# Patient Record
Sex: Male | Born: 1955
Health system: Southern US, Community
[De-identification: ages and names within clinical notes are randomized; demographics above are authoritative.]

## PROBLEM LIST (undated history)

## (undated) DIAGNOSIS — I2510 Atherosclerotic heart disease of native coronary artery without angina pectoris: Secondary | ICD-10-CM

## (undated) DIAGNOSIS — J449 Chronic obstructive pulmonary disease, unspecified: Secondary | ICD-10-CM

## (undated) DIAGNOSIS — F419 Anxiety disorder, unspecified: Secondary | ICD-10-CM

## (undated) DIAGNOSIS — N189 Chronic kidney disease, unspecified: Secondary | ICD-10-CM

## (undated) DIAGNOSIS — F32A Depression, unspecified: Secondary | ICD-10-CM

## (undated) DIAGNOSIS — K635 Polyp of colon: Secondary | ICD-10-CM

## (undated) DIAGNOSIS — E785 Hyperlipidemia, unspecified: Secondary | ICD-10-CM

## (undated) DIAGNOSIS — Z720 Tobacco use: Secondary | ICD-10-CM

## (undated) DIAGNOSIS — F329 Major depressive disorder, single episode, unspecified: Secondary | ICD-10-CM

## (undated) DIAGNOSIS — E860 Dehydration: Secondary | ICD-10-CM

## (undated) DIAGNOSIS — E119 Type 2 diabetes mellitus without complications: Secondary | ICD-10-CM

## (undated) DIAGNOSIS — H269 Unspecified cataract: Secondary | ICD-10-CM

## (undated) DIAGNOSIS — I10 Essential (primary) hypertension: Secondary | ICD-10-CM

## (undated) DIAGNOSIS — K5790 Diverticulosis of intestine, part unspecified, without perforation or abscess without bleeding: Secondary | ICD-10-CM

## (undated) DIAGNOSIS — I219 Acute myocardial infarction, unspecified: Secondary | ICD-10-CM

## (undated) HISTORY — PX: EYE SURGERY: SHX253

## (undated) HISTORY — DX: Anxiety disorder, unspecified: F41.9

## (undated) HISTORY — DX: Essential (primary) hypertension: I10

## (undated) HISTORY — PX: OTHER SURGICAL HISTORY: SHX169

## (undated) HISTORY — DX: Depression, unspecified: F32.A

## (undated) HISTORY — PX: CARDIAC CATHETERIZATION: SHX172

## (undated) HISTORY — DX: Polyp of colon: K63.5

## (undated) HISTORY — PX: COLONOSCOPY: SHX174

## (undated) HISTORY — DX: Diverticulosis of intestine, part unspecified, without perforation or abscess without bleeding: K57.90

## (undated) HISTORY — PX: POLYPECTOMY: SHX149

## (undated) HISTORY — PX: WISDOM TOOTH EXTRACTION: SHX21

## (undated) HISTORY — DX: Chronic obstructive pulmonary disease, unspecified: J44.9

## (undated) HISTORY — PX: BACK SURGERY: SHX140

## (undated) HISTORY — DX: Hyperlipidemia, unspecified: E78.5

## (undated) HISTORY — DX: Unspecified cataract: H26.9

## (undated) HISTORY — DX: Major depressive disorder, single episode, unspecified: F32.9

---

## 2005-08-17 ENCOUNTER — Ambulatory Visit: Payer: Self-pay | Admitting: Internal Medicine

## 2005-11-29 ENCOUNTER — Ambulatory Visit: Payer: Self-pay | Admitting: Internal Medicine

## 2005-12-23 ENCOUNTER — Ambulatory Visit: Payer: Self-pay | Admitting: Gastroenterology

## 2006-01-28 ENCOUNTER — Ambulatory Visit: Payer: Self-pay | Admitting: Gastroenterology

## 2006-01-28 ENCOUNTER — Encounter (INDEPENDENT_AMBULATORY_CARE_PROVIDER_SITE_OTHER): Payer: Self-pay | Admitting: Specialist

## 2007-02-06 ENCOUNTER — Ambulatory Visit: Payer: Self-pay | Admitting: Internal Medicine

## 2007-02-06 DIAGNOSIS — F17210 Nicotine dependence, cigarettes, uncomplicated: Secondary | ICD-10-CM | POA: Insufficient documentation

## 2007-02-06 DIAGNOSIS — L299 Pruritus, unspecified: Secondary | ICD-10-CM | POA: Insufficient documentation

## 2007-02-06 DIAGNOSIS — R7989 Other specified abnormal findings of blood chemistry: Secondary | ICD-10-CM | POA: Insufficient documentation

## 2007-02-06 DIAGNOSIS — I10 Essential (primary) hypertension: Secondary | ICD-10-CM | POA: Insufficient documentation

## 2007-02-06 DIAGNOSIS — Z720 Tobacco use: Secondary | ICD-10-CM

## 2007-02-13 ENCOUNTER — Encounter (INDEPENDENT_AMBULATORY_CARE_PROVIDER_SITE_OTHER): Payer: Self-pay | Admitting: *Deleted

## 2007-03-31 ENCOUNTER — Ambulatory Visit: Payer: Self-pay | Admitting: Internal Medicine

## 2007-08-02 ENCOUNTER — Ambulatory Visit: Payer: Self-pay | Admitting: Internal Medicine

## 2007-08-02 DIAGNOSIS — E785 Hyperlipidemia, unspecified: Secondary | ICD-10-CM | POA: Insufficient documentation

## 2007-08-07 ENCOUNTER — Encounter (INDEPENDENT_AMBULATORY_CARE_PROVIDER_SITE_OTHER): Payer: Self-pay | Admitting: *Deleted

## 2007-08-07 ENCOUNTER — Telehealth (INDEPENDENT_AMBULATORY_CARE_PROVIDER_SITE_OTHER): Payer: Self-pay | Admitting: *Deleted

## 2007-08-07 LAB — CONVERTED CEMR LAB: Hgb A1c MFr Bld: 6.2 % — ABNORMAL HIGH (ref 4.6–6.1)

## 2007-08-08 ENCOUNTER — Encounter (INDEPENDENT_AMBULATORY_CARE_PROVIDER_SITE_OTHER): Payer: Self-pay | Admitting: *Deleted

## 2007-08-08 ENCOUNTER — Telehealth (INDEPENDENT_AMBULATORY_CARE_PROVIDER_SITE_OTHER): Payer: Self-pay | Admitting: *Deleted

## 2007-08-08 ENCOUNTER — Ambulatory Visit: Payer: Self-pay | Admitting: Internal Medicine

## 2007-08-08 DIAGNOSIS — T887XXA Unspecified adverse effect of drug or medicament, initial encounter: Secondary | ICD-10-CM | POA: Insufficient documentation

## 2008-09-18 ENCOUNTER — Telehealth (INDEPENDENT_AMBULATORY_CARE_PROVIDER_SITE_OTHER): Payer: Self-pay | Admitting: *Deleted

## 2008-10-03 ENCOUNTER — Ambulatory Visit: Payer: Self-pay | Admitting: Internal Medicine

## 2008-10-03 DIAGNOSIS — Z860100 Personal history of colon polyps, unspecified: Secondary | ICD-10-CM | POA: Insufficient documentation

## 2008-10-03 DIAGNOSIS — K5730 Diverticulosis of large intestine without perforation or abscess without bleeding: Secondary | ICD-10-CM | POA: Insufficient documentation

## 2009-12-01 ENCOUNTER — Telehealth (INDEPENDENT_AMBULATORY_CARE_PROVIDER_SITE_OTHER): Payer: Self-pay | Admitting: *Deleted

## 2009-12-22 ENCOUNTER — Ambulatory Visit: Payer: Self-pay | Admitting: Internal Medicine

## 2009-12-22 ENCOUNTER — Encounter (INDEPENDENT_AMBULATORY_CARE_PROVIDER_SITE_OTHER): Payer: Self-pay | Admitting: *Deleted

## 2009-12-22 DIAGNOSIS — N400 Enlarged prostate without lower urinary tract symptoms: Secondary | ICD-10-CM | POA: Insufficient documentation

## 2009-12-22 DIAGNOSIS — G5600 Carpal tunnel syndrome, unspecified upper limb: Secondary | ICD-10-CM | POA: Insufficient documentation

## 2009-12-23 ENCOUNTER — Ambulatory Visit: Payer: Self-pay | Admitting: Internal Medicine

## 2009-12-24 LAB — CONVERTED CEMR LAB
AST: 22 units/L (ref 0–37)
Albumin: 4.1 g/dL (ref 3.5–5.2)
Alkaline Phosphatase: 61 units/L (ref 39–117)
Basophils Relative: 0.5 % (ref 0.0–3.0)
Calcium: 9.5 mg/dL (ref 8.4–10.5)
Creatinine, Ser: 0.9 mg/dL (ref 0.4–1.5)
Eosinophils Relative: 4.8 % (ref 0.0–5.0)
GFR calc non Af Amer: 88.24 mL/min (ref >60)
Glucose, Bld: 111 mg/dL — ABNORMAL HIGH (ref 70–99)
LDL Cholesterol: 106 mg/dL — ABNORMAL HIGH (ref 0–99)
Lymphocytes Relative: 31.8 % (ref 12.0–46.0)
Neutro Abs: 2.9 10*3/uL (ref 1.4–7.7)
Neutrophils Relative %: 49.2 % (ref 43.0–77.0)
PSA: 1.38 ng/mL (ref 0.10–4.00)
Potassium: 4.6 meq/L (ref 3.5–5.1)
RBC: 4.67 M/uL (ref 4.22–5.81)
RDW: 14 % (ref 11.5–14.6)
Sodium: 138 meq/L (ref 135–145)
Total CHOL/HDL Ratio: 5
Triglycerides: 135 mg/dL (ref 0.0–149.0)

## 2009-12-25 LAB — CONVERTED CEMR LAB: Hgb A1c MFr Bld: 6 % (ref 4.6–6.5)

## 2010-09-06 LAB — CONVERTED CEMR LAB
ALT: 20 units/L (ref 0–53)
AST: 20 units/L (ref 0–37)
CO2: 30 meq/L (ref 19–32)
GFR calc Af Amer: 90 mL/min
HDL: 28 mg/dL — ABNORMAL LOW (ref >39.0)
LDL Cholesterol: 116 mg/dL — ABNORMAL HIGH (ref 0–99)
Sodium: 142 meq/L (ref 135–145)

## 2010-09-08 NOTE — Letter (Signed)
Summary: Out of Work  [facility] [employer]  [address] [place], [place] [address]   Phone: [phone]  Fax: [phone]    Dec 22, 2009   Employee:  ALIXANDER RALLIS    To Whom It May Concern:   For Medical reasons, please excuse the above named employee from work for the following dates:  Start:   12/22/2009    End:   12/23/2009  If you need additional information, please feel free to contact our office.         Sincerely,    Chrae Marlynn Perking

## 2010-09-08 NOTE — Assessment & Plan Note (Signed)
Summary: cpx/cbs   Vital Signs:  Patient profile:   55 year old male Weight:      175.2 pounds BMI:     23.85 Pulse rate:   78 / minute Resp:     15 per minute BP sitting:   140 / 90  (left arm) Cuff size:   large  Vitals Entered By: Shonna Chock (Dec 22, 2009 1:25 PM)  Comments REVIEWED MED LIST, PATIENT AGREED DOSE AND INSTRUCTION CORRECT    History of Present Illness: Keith [name] is here for a physical; he has had intermittent N&T in LUE  from elbow to wrist lasting < 1 hour  for 3 yrs intermittently , more noticeable over past  7-8 months, ? related to driving. 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  Allergies: 1)  ! Wellbutrin 2)  ! Pcn  Past History:  Past Medical History: Hyperlipidemia fasting hyperglycemia; A1c 6.2% & 5.8% in 2005 w/o Rx pruritus, intermittent smoker hypertension Colonic polyps, hx of Diverticulosis, colon  Past Surgical History: Congenital ear deformity , S/P 6 surgeries Colon polypectomy 2007, Dr Russella Dar, Tubular Adenoma  Family History: Father: CVA,melanoma, stomach cancer, alcoholism Mother: DM Siblings:bro +/- DM ; sister breast cancer; bro LN cancer  Social History: Married Current Smoker: 1-1&1/2 PPD Alcohol use-yes: case of beer on weekends Regular exercise-no  Review of Systems  The patient denies anorexia, fever, vision loss, decreased hearing, hoarseness, chest pain, syncope, dyspnea on exertion, peripheral edema, abdominal pain, melena, hematochezia, severe indigestion/heartburn, suspicious skin lesions, depression, abnormal bleeding, enlarged lymph nodes, and angioedema.         Weight loss of 20# with decreased alcohol intake  Resp:  Complains of cough and sputum productive; denies chest pain with inspiration, coughing up blood, shortness of breath, and wheezing; Sputum in am. GU:  Denies discharge, dysuria, hematuria, and  incontinence; Nocturia X 1 . Derm:  Complains of itching; denies changes in nail beds, dryness, lesion(s), and rash; Hand itching in evenings. Neuro:  Complains of numbness and tingling; denies brief paralysis and weakness. Endo:  Denies excessive hunger, excessive thirst, and excessive urination. Allergy:  Denies hives or rash, itching eyes, and sneezing.  Physical Exam  General:  Thin but well-nourished; alert,appropriate and cooperative throughout examination Head:  Normocephalic and atraumatic without obvious abnormalities.PaTTERN  alopecia  Eyes:  No corneal or conjunctival inflammation noted.Perrla. Funduscopic exam benign, without hemorrhages, exudates or papilledema.  Ears:  External ear exam shows  post op changes on R. Otoscopic examination reveals clear canals, tympanic membranes are intact bilaterally without bulging, retraction, inflammation or discharge. Hearing is grossly normal bilaterally. Nose:  External nasal examination shows no deformity or inflammation. Nasal mucosa are pink and moist without lesions or exudates. Mouth:  Oral mucosa and oropharynx without lesions or exudates.  Teeth in good repair. Neck:  No deformities, masses, or tenderness noted. Lungs:  Normal respiratory effort, chest expands symmetrically. Lungs are clear to auscultation, no crackles or wheezes. Heart:  Normal rate and regular rhythm. S1 and S2 normal without gallop, murmur, click, rub or other extra sounds. Abdomen:  Bowel sounds positive,abdomen soft and non-tender without masses, organomegaly or hernias noted. Rectal:  No external abnormalities noted. Normal sphincter tone. No rectal masses or tenderness. Genitalia:  Testes bilaterally descended without nodularity, tenderness or masses. No scrotal masses or lesions. No penis lesions or urethral discharge. Prostate:  no nodules, no asymmetry,  no induration, but 1.5 - 2 + enlarged.   Msk:  No deformity or scoliosis noted of thoracic or lumbar spine.    Pulses:  R and L carotid,radial,dorsalis pedis and posterior tibial pulses are full and equal bilaterally Extremities:  No clubbing, cyanosis, edema, or deformity noted with normal full range of motion of all joints.   Neurologic:  alert & oriented X3 and DTRs symmetrical and normal.  Tinel's L > R Skin:  Intact without suspicious lesions or rashes Cervical Nodes:  No lymphadenopathy noted Axillary Nodes:  No palpable lymphadenopathy Psych:  memory intact for recent and remote, normally interactive, and good eye contact.     Impression & Recommendations:  Problem # 1:  ROUTINE GENERAL MEDICAL EXAM@HEALTH  CARE FACL (ICD-V70.0)  Orders: EKG w/ Interpretation (93000) T-2 View CXR (71020TC)  Problem # 2:  CARPAL TUNNEL SYNDROME (ICD-354.0) LUE  Problem # 3:  SMOKER (ICD-305.1)  The following medications were removed from the medication list:    Chantix Starting Month Pak 0.5 Mg X 11 & 1 Mg X 42 Tabs (Varenicline tartrate) .Marland Kitchen... As per pack    Chantix Continuing Month Pak 1 Mg Tabs (Varenicline tartrate) .Marland Kitchen... As per pack ,month 2 (& possibly month 3)  Orders: T-2 View CXR (71020TC)  Problem # 4:  HYPERTENSION, ESSENTIAL NOS (ICD-401.9)  The following medications were removed from the medication list:    Lisinopril 20 Mg Tabs (Lisinopril) .Marland Kitchen... 1 once daily if bp averages > 130/85 His updated medication list for this problem includes:    Clonidine Hcl 0.1 Mg Tabs (Clonidine hcl) .Marland Kitchen... 1 by mouth bid  Orders: EKG w/ Interpretation (93000)  Problem # 5:  HYPERPLASIA PROSTATE UNS W/O UR OBST & OTH LUTS (ICD-600.90)  His updated medication list for this problem includes:    Clonidine Hcl 0.1 Mg Tabs (Clonidine hcl) .Marland Kitchen... 1 by mouth bid  Problem # 6:  HYPERLIPIDEMIA (ICD-272.4)  Problem # 7:  COUGH (ICD-786.2)  Orders: T-2 View CXR (71020TC)  Complete Medication List: 1)  Clonidine Hcl 0.1 Mg Tabs (Clonidine hcl) .Marland Kitchen.. 1 by mouth bid 2)  Claritin 10 Mg Tabs (Loratadine)  .Marland Kitchen.. 1 once daily as needed  Patient Instructions: 1)  Please schedule fasting labs:BMP ;Hepatic Panel ;Lipid Panel;TSH ;CBC w/ Diff ;PSA . Wear a wrist splint as needed for CTS symptoms. Prescriptions: CLONIDINE HCL 0.1 MG TABS (CLONIDINE HCL) 1 by mouth bid  #180 Each x 3   Entered and Authorized by:   Marga Melnick MD   Signed by:   Marga Melnick MD on 12/22/2009   Method used:   Print then Give to Patient   RxID:   [id number] CLARITIN 10 MG  TABS (LORATADINE) 1 once daily as needed  #90 Each x 3   Entered and Authorized by:   Marga Melnick MD   Signed by:   Marga Melnick MD on 12/22/2009   Method used:   Print then Give to Patient   RxID:   [id number]

## 2010-09-08 NOTE — Progress Notes (Signed)
Summary: Call from Patient  Phone Note Call from Patient Call back at Home Phone [phone]   Caller: Patient Reason for Call: Refill Medication Summary of Call: Patient called this morning and was checking on his Clonidine. 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 Can you send this through please? Initial call taken by: Harold Barban,  December 01, 2009 8:40 AM  Follow-up for Phone Call        Patient aware rx called in and he needs to schedule an appointment. Last OV was 09/2008 (over a year ago). Patient was transferred to a scheduler to set up. Follow-up by: Shonna Chock,  December 01, 2009 9:19 AM

## 2011-01-12 ENCOUNTER — Encounter: Payer: Self-pay | Admitting: Gastroenterology

## 2011-08-27 ENCOUNTER — Other Ambulatory Visit: Payer: Self-pay | Admitting: Internal Medicine

## 2011-08-27 ENCOUNTER — Telehealth: Payer: Self-pay | Admitting: Internal Medicine

## 2011-08-27 MED ORDER — CLONIDINE HCL 0.1 MG PO TABS: 0.1000 mg | ORAL_TABLET | Freq: Two times a day (BID) | ORAL | Status: DC

## 2011-08-27 NOTE — Telephone Encounter (Signed)
The pt called and requested refill on Clonidine 0.1mg  tabs.  Thanks!!

## 2011-08-27 NOTE — Telephone Encounter (Signed)
RX sent, patient is due to schedule a CPX

## 2011-09-24 ENCOUNTER — Ambulatory Visit (INDEPENDENT_AMBULATORY_CARE_PROVIDER_SITE_OTHER): Payer: 59 | Admitting: Internal Medicine

## 2011-09-24 ENCOUNTER — Encounter: Payer: Self-pay | Admitting: Internal Medicine

## 2011-09-24 ENCOUNTER — Ambulatory Visit (INDEPENDENT_AMBULATORY_CARE_PROVIDER_SITE_OTHER)
Admission: RE | Admit: 2011-09-24 | Discharge: 2011-09-24 | Disposition: A | Discharge status: Home / Self Care | Payer: 59 | Source: Ambulatory Visit | Attending: Internal Medicine | Admitting: Internal Medicine

## 2011-09-24 ENCOUNTER — Telehealth: Payer: Self-pay | Admitting: *Deleted

## 2011-09-24 VITALS — BP 142/84 | HR 74 | Temp 97.9°F | Ht 72.0 in | Wt 189.0 lb

## 2011-09-24 DIAGNOSIS — J42 Unspecified chronic bronchitis: Secondary | ICD-10-CM

## 2011-09-24 DIAGNOSIS — I10 Essential (primary) hypertension: Secondary | ICD-10-CM

## 2011-09-24 DIAGNOSIS — Z0000 Encounter for general adult medical examination without abnormal findings: Secondary | ICD-10-CM

## 2011-09-24 MED ORDER — BLOOD PRESSURE CUFF MISC: Status: DC

## 2011-09-24 MED ORDER — CLONIDINE HCL 0.1 MG PO TABS: 0.1000 mg | ORAL_TABLET | Freq: Two times a day (BID) | ORAL | Status: DC

## 2011-09-24 NOTE — Progress Notes (Signed)
Subjective:    Patient ID: Keith [name], male    DOB: [birth year], 56 y.o.   MRN: [id number]  HPI  Keith [name]  is here for a physical; acute issues include intermittent CTS symptoms in LUE as numbness and tingling while driving. Also on occasion he's noted some radicular symptoms in the left upper extremity when he sleeps in the prone position.      Review of Systems Patient reports no  vision/ hearing changes,anorexia, weight change, fever ,adenopathy, persistant / recurrent hoarseness, swallowing issues, chest pain,palpitations, edema, hemoptysis, dyspnea(rest, exertional, paroxysmal nocturnal), gastrointestinal  bleeding (melena, rectal bleeding), abdominal pain, excessive heart burn, GU symptoms( dysuria, hematuria, pyuria, voiding/incontinence  issues) syncope, focal weakness, memory loss, skin/hair/nail changes,depression, anxiety, abnormal bruising/bleeding, or musculoskeletal symptoms/signs.   With hot or cold fluids he has a vague sensation of " the fluid leaking out" in the left upper quadrant.  He describes daily productive cough in the context of smoking one half pack per day.     Objective:   Physical Exam Gen.: Healthy and well-nourished in appearance. Alert, appropriate and cooperative throughout exam. Head: Normocephalic without obvious abnormalities; goatee & pattern alopecia  Eyes: No corneal or conjunctival inflammation noted. Pupils equal round reactive to light and accommodation. Fundal exam is benign without hemorrhages, exudate, papilledema. Extraocular motion intact. Vision grossly normal with lenses. Ears: External  ear exam reveals no significant post op  deformities. Canals clear .TMs normal. Hearing is grossly normal bilaterally. Nose: External nasal exam reveals no deformity or inflammation. Nasal mucosa are pink and moist. No lesions or exudates noted.  Mouth: Oral mucosa and oropharynx reveal no lesions or exudates. Teeth in good repair. Neck: No deformities,  masses, or tenderness noted. Range of motion & Thyroid normal Lungs: Normal respiratory effort; chest expands symmetrically. Lungs are clear to auscultation without rales, wheezes, or increased work of breathing. Heart: Normal rate and rhythm. Normal S1 and S2. No gallop, click, or rub. Grade 1/2- 1 over 6 systolic murmur   Abdomen: Bowel sounds normal; abdomen soft and nontender. No masses, organomegaly or hernias noted. Genitalia/DRE: Genital exams unremarkable. The left prostate is mildly enlarged without nodularity or induration. The  R prostate is normal.                                                                                Musculoskeletal/extremities: No deformity or scoliosis noted of  the thoracic or lumbar spine. No clubbing, cyanosis, edema, or deformity noted. Range of motion  normal .Tone & strength  normal.Joints normal. Nail health  good. Vascular: Carotid, radial artery, dorsalis pedis and  posterior tibial pulses are full and equal. No bruits present. Neurologic: Alert and oriented x3. Deep tendon reflexes symmetrical and normal.          Skin: Intact without suspicious lesions or rashes. Lymph: No cervical, axillary, or inguinal lymphadenopathy present. Psych: Mood and affect are normal. Normally interactive  Assessment & Plan:  #1 comprehensive physical exam; no acute findings #2 see Problem List with Assessments & Recommendations  #3 active smoking with chronic bronchitis. 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 Weaning with nicotine patches and/or  hyponosis are most logical options in view of these limitations. Plan: see Orders

## 2011-09-24 NOTE — Telephone Encounter (Signed)
Rx written.

## 2011-09-24 NOTE — Telephone Encounter (Signed)
Pt called stating you told him to purchase a BP monitor & he wanted to know if you could write him a prescription for it so he can use his insurance card.

## 2011-09-24 NOTE — Telephone Encounter (Signed)
Discuss with patient, Rx sent. 

## 2011-09-24 NOTE — Patient Instructions (Addendum)
Preventive Health Care: Exercise at least 30-45 minutes a day,  3-4 days a week.  Eat a low-fat diet with lots of fruits and vegetables, up to 7-9 servings per day.Consume less than 40 grams of sugar per day from foods & drinks with High Fructose Corn Sugar as # 1,2,3 or # 4 on label. Alcohol If you drink, do it moderately,less than 9 drinks per week, preferably less than 6 @ most. Please  schedule fasting Labs : BMET,Lipids, hepatic panel, CBC & dif, TSH, PSA. PLEASE BRING THESE INSTRUCTIONS TO FOLLOW UP  LAB APPOINTMENT.This will guarantee correct labs are drawn, eliminating need for repeat blood sampling ( needle sticks ! ). Diagnoses /Codes:V70.0. Order for x-rays entered into  the computer; these will be performed at [address] [facility]. across from [facility]. No appointment is necessary. Please think about quitting smoking. Review the risks we discussed. Please call 1-800-QUIT-NOW (978-727-8836) for free smoking cessation counseling OR consider  [facility]'s smoking cessation program @ www.[place].com or (437)842-4448.   Blood Pressure Goal  Ideally is an AVERAGE < 135/85. This AVERAGE should be calculated from @ least 5-7 BP readings taken @ different times of day on different days of week. You should not respond to isolated BP readings , but rather the AVERAGE for that week

## 2011-10-22 ENCOUNTER — Other Ambulatory Visit: Payer: Self-pay | Admitting: Internal Medicine

## 2011-10-22 DIAGNOSIS — Z0000 Encounter for general adult medical examination without abnormal findings: Secondary | ICD-10-CM

## 2011-10-25 ENCOUNTER — Other Ambulatory Visit (INDEPENDENT_AMBULATORY_CARE_PROVIDER_SITE_OTHER): Payer: 59

## 2011-10-25 DIAGNOSIS — Z0000 Encounter for general adult medical examination without abnormal findings: Secondary | ICD-10-CM

## 2011-10-25 LAB — CBC WITH DIFFERENTIAL/PLATELET
Basophils Relative: 0.3 % (ref 0.0–3.0)
Eosinophils Absolute: 0.2 10*3/uL (ref 0.0–0.7)
MCHC: 33.7 g/dL (ref 30.0–36.0)
MCV: 95.1 fl (ref 78.0–100.0)
Monocytes Absolute: 0.7 10*3/uL (ref 0.1–1.0)
Neutrophils Relative %: 65.4 % (ref 43.0–77.0)
Platelets: 158 10*3/uL (ref 150.0–400.0)
RBC: 5 Mil/uL (ref 4.22–5.81)
RDW: 13.7 % (ref 11.5–14.6)

## 2011-10-25 LAB — BASIC METABOLIC PANEL
BUN: 16 mg/dL (ref 6–23)
Calcium: 9.4 mg/dL (ref 8.4–10.5)
Creatinine, Ser: 1.2 mg/dL (ref 0.4–1.5)
GFR: 64.28 mL/min (ref >60.00)
Glucose, Bld: 142 mg/dL — ABNORMAL HIGH (ref 70–99)

## 2011-10-25 LAB — HEPATIC FUNCTION PANEL
ALT: 21 U/L (ref 0–53)
AST: 21 U/L (ref 0–37)
Alkaline Phosphatase: 47 U/L (ref 39–117)
Bilirubin, Direct: 0.1 mg/dL (ref 0.0–0.3)
Total Bilirubin: 0.4 mg/dL (ref 0.3–1.2)

## 2011-10-25 LAB — PSA: PSA: 1.21 ng/mL (ref 0.10–4.00)

## 2012-04-27 ENCOUNTER — Encounter: Payer: Self-pay | Admitting: Gastroenterology

## 2012-05-05 ENCOUNTER — Encounter: Payer: Self-pay | Admitting: Gastroenterology

## 2012-05-12 ENCOUNTER — Encounter: Payer: Self-pay | Admitting: Gastroenterology

## 2012-05-12 ENCOUNTER — Ambulatory Visit (AMBULATORY_SURGERY_CENTER): Payer: 59 | Admitting: *Deleted

## 2012-05-12 VITALS — Ht 72.0 in | Wt 189.0 lb

## 2012-05-12 DIAGNOSIS — Z1211 Encounter for screening for malignant neoplasm of colon: Secondary | ICD-10-CM

## 2012-05-12 MED ORDER — MOVIPREP 100 G PO SOLR: ORAL | Status: DC

## 2012-05-26 ENCOUNTER — Telehealth: Payer: Self-pay | Admitting: Gastroenterology

## 2012-05-26 NOTE — Telephone Encounter (Signed)
Rx for MoviPrep had been sent to [facility]; pt's wife checked for prep Rx at [facility].  Will go to [facility] to get Rx and call back if there are any questions.

## 2012-05-29 ENCOUNTER — Encounter: Payer: Self-pay | Admitting: Gastroenterology

## 2012-05-29 ENCOUNTER — Ambulatory Visit (AMBULATORY_SURGERY_CENTER): Payer: 59 | Admitting: Gastroenterology

## 2012-05-29 VITALS — BP 153/85 | HR 67 | Temp 99.0°F | Resp 18 | Ht 72.0 in | Wt 189.0 lb

## 2012-05-29 DIAGNOSIS — D126 Benign neoplasm of colon, unspecified: Secondary | ICD-10-CM

## 2012-05-29 DIAGNOSIS — Z1211 Encounter for screening for malignant neoplasm of colon: Secondary | ICD-10-CM

## 2012-05-29 DIAGNOSIS — Z86010 Personal history of colonic polyps: Secondary | ICD-10-CM

## 2012-05-29 MED ORDER — SODIUM CHLORIDE 0.9 % IV SOLN: 500.0000 mL | INTRAVENOUS | Status: DC

## 2012-05-29 NOTE — Progress Notes (Signed)
Pressure applied to abdomen to reach the cecum 

## 2012-05-29 NOTE — Op Note (Signed)
[facility] [address] [place], [address]   COLONOSCOPY PROCEDURE REPORT  PATIENT: Jantzen, Pilger  MR#: [id number] BIRTHDATE: 1955-12-02 , 56  yrs. old GENDER: Male ENDOSCOPIST: Meryl Dare, MD, [facility] REFERRED BY: PROCEDURE DATE:  05/29/2012 PROCEDURE:   Colonoscopy with snare polypectomy ASA CLASS:   Class II INDICATIONS:patient's personal history of adenomatous colon polyps, 01/2006. MEDICATIONS: MAC sedation, administered by CRNA and propofol (Diprivan) 300mg  IV DESCRIPTION OF PROCEDURE:   After the risks benefits and alternatives of the procedure were thoroughly explained, informed consent was obtained.  A digital rectal exam revealed no abnormalities of the rectum.   The LB [id number]  endoscope was introduced through the anus and advanced to the cecum, which was identified by both the appendix and ileocecal valve. No adverse events experienced.   The quality of the prep was excellent, using MoviPrep  The instrument was then slowly withdrawn as the colon was fully examined.  COLON FINDINGS: A sessile polyp measuring 7 mm in size was found in the sigmoid colon.  A polypectomy was performed using snare cautery.  The resection was complete and the polyp tissue was completely retrieved.   Moderate diverticulosis was noted in the sigmoid colon and descending colon.   The colon was otherwise normal.  There was no diverticulosis, inflammation, polyps or cancers unless previously stated.  Retroflexed views revealed internal hemorrhoids. The time to cecum=3 minutes 21 seconds. Withdrawal time=10 minutes 02 seconds.  The scope was withdrawn and the procedure completed.  COMPLICATIONS: There were no complications.  ENDOSCOPIC IMPRESSION: 1.   Sessile polyp in the sigmoid colon; polypectomy was performed using snare cautery 2.   Moderate diverticulosis was noted in the sigmoid colon and descending colon 3.   Small internal  hemorrhoids  RECOMMENDATIONS: 1.  Hold aspirin, aspirin products, and anti-inflammatory medication for 2 weeks. 2.  await pathology results 3.  High fiber diet with liberal fluid intake. 4.  repeat Colonoscopy in 5 years.  eSigned:  Meryl Dare, MD, [facility] 05/29/2012 3:01 PM

## 2012-05-29 NOTE — Progress Notes (Signed)
Patient did not experience any of the following events: a burn prior to discharge; a fall within the facility; wrong site/side/patient/procedure/implant event; or a hospital transfer or hospital admission upon discharge from the facility. (G8907) Patient did not have preoperative order for IV antibiotic SSI prophylaxis. (G8918)  

## 2012-05-29 NOTE — Patient Instructions (Signed)
Colon polyp x 1 removed today,diverticulosis and hemorrhoids seen. See handouts. Hold aspirin,aspirin products and anti-inflammatory medications for 2 weeks. High fiber diet with liberal fluid intake. Repeat colonoscopy in 5 years. Call us with any questions or concerns. Thank you!!  YOU HAD AN ENDOSCOPIC PROCEDURE TODAY AT THE [facility]: Refer to the procedure report that was given to you for any specific questions about what was found during the examination.  If the procedure report does not answer your questions, please call your gastroenterologist to clarify.  If you requested that your care partner not be given the details of your procedure findings, then the procedure report has been included in a sealed envelope for you to review at your convenience later.  YOU SHOULD EXPECT: Some feelings of bloating in the abdomen. Passage of more gas than usual.  Walking can help get rid of the air that was put into your GI tract during the procedure and reduce the bloating. If you had a lower endoscopy (such as a colonoscopy or flexible sigmoidoscopy) you may notice spotting of blood in your stool or on the toilet paper. If you underwent a bowel prep for your procedure, then you may not have a normal bowel movement for a few days.  DIET: Your first meal following the procedure should be a light meal and then it is ok to progress to your normal diet.  A half-sandwich or bowl of soup is an example of a good first meal.  Heavy or fried foods are harder to digest and may make you feel nauseous or bloated.  Likewise meals heavy in dairy and vegetables can cause extra gas to form and this can also increase the bloating.  Drink plenty of fluids but you should avoid alcoholic beverages for 24 hours.  ACTIVITY: Your care partner should take you home directly after the procedure.  You should plan to take it easy, moving slowly for the rest of the day.  You can resume normal activity the day after the  procedure however you should NOT DRIVE or use heavy machinery for 24 hours (because of the sedation medicines used during the test).    SYMPTOMS TO REPORT IMMEDIATELY: A gastroenterologist can be reached at any hour.  During normal business hours, 8:30 AM to 5:00 PM Monday through Friday, call [phone].  After hours and on weekends, please call the GI answering service at [phone] who will take a message and have the physician on call contact you.   Following lower endoscopy (colonoscopy or flexible sigmoidoscopy):  Excessive amounts of blood in the stool  Significant tenderness or worsening of abdominal pains  Swelling of the abdomen that is new, acute  Fever of 100F or higher  FOLLOW UP: If any biopsies were taken you will be contacted by phone or by letter within the next 1-3 weeks.  Call your gastroenterologist if you have not heard about the biopsies in 3 weeks.  Our staff will call the home number listed on your records the next business day following your procedure to check on you and address any questions or concerns that you may have at that time regarding the information given to you following your procedure. This is a courtesy call and so if there is no answer at the home number and we have not heard from you through the emergency physician on call, we will assume that you have returned to your regular daily activities without incident.  SIGNATURES/CONFIDENTIALITY: You and/or your care partner have  signed paperwork which will be entered into your electronic medical record.  These signatures attest to the fact that that the information above on your After Visit Summary has been reviewed and is understood.  Full responsibility of the confidentiality of this discharge information lies with you and/or your care-partner.

## 2012-05-30 ENCOUNTER — Telehealth: Payer: Self-pay | Admitting: *Deleted

## 2012-05-30 NOTE — Telephone Encounter (Signed)
  Follow up Call-  Call back number 05/29/2012  Post procedure Call Back phone  # [phone] cell  Permission to leave phone message Yes     Patient questions:  Do you have a fever, pain , or abdominal swelling? no Pain Score  0 *  Have you tolerated food without any problems? yes  Have you been able to return to your normal activities? yes  Do you have any questions about your discharge instructions: Diet   no Medications  no Follow up visit  no  Do you have questions or concerns about your Care? no  Actions: * If pain score is 4 or above: No action needed, pain <4.

## 2012-06-01 ENCOUNTER — Encounter: Payer: Self-pay | Admitting: Gastroenterology

## 2012-10-28 ENCOUNTER — Other Ambulatory Visit: Payer: Self-pay | Admitting: Internal Medicine

## 2012-10-30 NOTE — Telephone Encounter (Signed)
Patient needs to schedule a CPX  

## 2013-03-05 ENCOUNTER — Encounter: Payer: Self-pay | Admitting: Internal Medicine

## 2013-03-05 ENCOUNTER — Ambulatory Visit (INDEPENDENT_AMBULATORY_CARE_PROVIDER_SITE_OTHER): Payer: 59 | Admitting: Internal Medicine

## 2013-03-05 VITALS — BP 138/80 | HR 82 | Temp 98.4°F | Wt 184.0 lb

## 2013-03-05 DIAGNOSIS — I10 Essential (primary) hypertension: Secondary | ICD-10-CM

## 2013-03-05 DIAGNOSIS — T887XXA Unspecified adverse effect of drug or medicament, initial encounter: Secondary | ICD-10-CM

## 2013-03-05 DIAGNOSIS — Z860100 Personal history of colon polyps, unspecified: Secondary | ICD-10-CM

## 2013-03-05 DIAGNOSIS — F17200 Nicotine dependence, unspecified, uncomplicated: Secondary | ICD-10-CM

## 2013-03-05 MED ORDER — CLONIDINE HCL 0.1 MG PO TABS: 0.1000 mg | ORAL_TABLET | Freq: Two times a day (BID) | ORAL | Status: DC

## 2013-03-05 NOTE — Assessment & Plan Note (Signed)
[place] in 2016

## 2013-03-05 NOTE — Assessment & Plan Note (Signed)
BP goals discussed BMET 

## 2013-03-05 NOTE — Progress Notes (Signed)
  Subjective:    Patient ID: Keith [name], male    DOB: 1956-03-05, 57 y.o.   MRN: [id number]  HPI  CHRONIC HYPERTENSION follow-up:  Home blood pressure  not monitored  Patient is compliant with medications  No adverse effects noted from medication  No exercise program   Modified  heart healthy even when on road  &  low-salt diet           Review of Systems No chest pain, palpitations, dyspnea, claudication,edema or paroxysmal nocturnal dyspnea described No significant lightheadedness, headache, epistaxis, or syncope.  Last week he had right inguinal pain after lowering the landing gear on his truck. He also has some discomfort with cough in LLQ  He does have a productive cough but denies hemoptysis  He rarely has had some rectal bleeding on tissue; "once every 18 mos". His colonoscopy is up-to-date; it was completed in October last year. He did have a tubular adenoma.  He does have some warts on his hands & questions using compound W.       Objective:   Physical Exam Gen.: Thin but adequately nourished in appearance. Alert, appropriate and cooperative throughout exam. Head: Normocephalic without obvious abnormalities;  Goatee & pattern alopecia  Eyes: No corneal or conjunctival inflammation noted.No icterus Ears: External  ear exam reveals R  deformities.  Nose: External nasal exam reveals no deformity or inflammation. Nasal mucosa are pink and moist. No lesions or exudates noted.   Mouth: Oral mucosa and oropharynx reveal no lesions or exudates. Teeth in good repair. Neck: No deformities, masses, or tenderness noted.  Thyroid normal. Lungs: Normal respiratory effort; chest expands symmetrically. Isolated scattered , self limited wheeze.Decreased BS Heart: Normal rate and rhythm. Normal S1 and S2. No gallop, click, or rub. S4 w/o murmur. Abdomen: Bowel sounds normal; abdomen soft and nontender. No masses, organomegaly or hernias noted.                                 Musculoskeletal/extremities:  No clubbing, cyanosis, edema, or significant extremity  deformity noted. Range of motion normal .Tone & strength  Normal. Able to lie down & sit up w/o help.  Vascular: Carotid, radial artery, dorsalis pedis and  posterior tibial pulses are full and equal. No bruits present. Neurologic: Alert and oriented x3. Deep tendon reflexes symmetrical and normal.  Gait normal  including heel & toe walking .        Skin: Intact without suspicious lesions or rashes.3 warts on parts Lymph: No cervical, axillary, or inguinal lymphadenopathy present. Psych: Mood and affect are normal. Normally interactive                                                                                        Assessment & Plan:  See Current Assessment & Plan in Problem List under specific Diagnosis

## 2013-03-05 NOTE — Patient Instructions (Addendum)
Minimal Blood Pressure Goal= AVERAGE < 140/90;  Ideal is an AVERAGE < 135/85. This AVERAGE should be calculated from @ least 5-7 BP readings taken @ different times of day on different days of week. You should not respond to isolated BP readings , but rather the AVERAGE for that week .Please bring your  blood pressure cuff to office visits to verify that it is reliable.It  can also be checked against the blood pressure device at the pharmacy. Finger or wrist cuffs are not dependable; an arm cuff is. Please think about quitting smoking. Review the risks we discussed. Please call 1-800-QUIT-NOW (1-800-784-8669) for free smoking cessation counseling.  If you activate the  My Chart system; lab & Xray results will be released directly  to you as soon as I review & address these through the computer. If you choose not to sign up for My Chart within 36 hours of labs being drawn; results will be reviewed & interpretation added before being copied & mailed, causing a delay in getting the results to you.If you do not receive that report within 7-10 days ,please call. Additionally you can use this system to gain direct  access to your records  if  out of town or @ an office of a  physician who is not in  the My Chart network.  This improves continuity of care & places you in control of your medical record.   

## 2013-03-05 NOTE — Assessment & Plan Note (Signed)
Risk discussed;he may consider E cigarettes

## 2013-03-06 LAB — BASIC METABOLIC PANEL
BUN: 14 mg/dL (ref 6–23)
GFR: 76.79 mL/min (ref >60.00)
Potassium: 4.2 mEq/L (ref 3.5–5.1)

## 2013-09-17 ENCOUNTER — Encounter: Payer: Self-pay | Admitting: Internal Medicine

## 2013-09-17 ENCOUNTER — Ambulatory Visit (INDEPENDENT_AMBULATORY_CARE_PROVIDER_SITE_OTHER): Payer: No Typology Code available for payment source | Admitting: Internal Medicine

## 2013-09-17 VITALS — BP 148/80 | HR 86 | Temp 98.0°F | Resp 14 | Wt 179.6 lb

## 2013-09-17 DIAGNOSIS — M5412 Radiculopathy, cervical region: Secondary | ICD-10-CM

## 2013-09-17 DIAGNOSIS — I10 Essential (primary) hypertension: Secondary | ICD-10-CM

## 2013-09-17 MED ORDER — CELECOXIB 200 MG PO CAPS: ORAL_CAPSULE | ORAL | Status: DC

## 2013-09-17 MED ORDER — LOSARTAN POTASSIUM 50 MG PO TABS: 50.0000 mg | ORAL_TABLET | Freq: Every day | ORAL | Status: DC

## 2013-09-17 MED ORDER — PREGABALIN 50 MG PO CAPS: 50.0000 mg | ORAL_CAPSULE | Freq: Three times a day (TID) | ORAL | Status: DC

## 2013-09-17 NOTE — Progress Notes (Signed)
Pre visit review using our clinic review tool, if applicable. No additional management support is needed unless otherwise documented below in the visit note. 

## 2013-09-17 NOTE — Progress Notes (Signed)
Subjective:    Patient ID: Keith [name], male    DOB: 10/16/1955, 58 y.o.   MRN: [id number]  HPI Continuous burning pain from right occipital area to right shoulder blade began 3 weeks ago and then subsided. Came back 4-5 days later and radiated to the front upper right chest area which he describes as " itchy and feels raw". Also radiates up to right side of head behind right eye. Does not radiate down right arm. No numbness or tingling in right arm. Has tried anesthetic cream and Aleve. Aleve helped a little. Postural changes do not help. Pain 5/10 today in right shoulder with itching/burning in right chest. No precipitating injury. Has never had injury to this area. He is a long distance Administrator. Symptoms worse in am and then subside throughout day.    Review of Systems There has been no  associated rash in the area of the pain. He notes no change in temperature or color of the skin in this area.  He has no limb weakness  He denies associated visual changes or headaches  There's been no incontinence of urine or stool.  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 This was in the context of continuing to smoke and increased intake of caffeine the morning of the  exam     Objective:   Physical Exam Gen.: Healthy and well-nourished in appearance. Alert, appropriate and cooperative throughout exam.  Head: Normocephalic without obvious abnormalities;pattern alopecia  Eyes: No corneal or conjunctival inflammation noted. Pupils equal round reactive to light and accommodation. Extraocular motion intact.  Ears: External  ear exam reveals mild post op deformities. Canals clear .TMs normal. Hearing is grossly normal bilaterally. Nose: External nasal exam reveals no deformity or inflammation. Nasal mucosa are pink and moist. No lesions or exudates noted.   Mouth: Oral mucosa and oropharynx reveal no lesions or exudates. Teeth in good repair. Neck:  No deformities, masses, or tenderness noted. Range of motion excellent. Thyroid normal. Lungs: Normal respiratory effort; chest expands symmetrically. Lungs are clear to auscultation without rales, wheezes, or increased work of breathing.BS somewhat decreased. Heart: Normal rate and rhythm. Normal S1 and S2. No gallop, click, or rub. No murmur.                                   Musculoskeletal/extremities: No deformity or scoliosis noted of  the thoracic or lumbar spine.  No clubbing, cyanosis, edema, or significant extremity  deformity noted. Range of motion normal .Tone & strength normal. Hand joints normal  Vascular: Carotid, radial artery pulses are full and equal. No bruits present. Neurologic: Alert and oriented x3. Deep tendon reflexes symmetrical and normal. No cranial nerve deficit       Skin: Intact without suspicious lesions or rashes. Lymph: No cervical, axillary lymphadenopathy present. Psych: Mood and affect are normal. Normally interactive                                                                                        Assessment & Plan:  #  1 cervical radiculopathy most likely related to position change while asleep as symptoms worse in am  Interventions are somewhat limited in that he is a long distance truck driver.  Lyrica 50 mg at bedtime will be initiated. He can take Celebrex one twice a day for the next 7 days during daylight hours.  #2 HTN , ? Control See Rx for losartan 50 mg

## 2013-09-17 NOTE — Patient Instructions (Addendum)
Use a cervical memory foam pillow to prevent hyperextension or hyperflexion of the cervical spine. Minimal Blood Pressure Goal= AVERAGE < 140/90;  Ideal is an AVERAGE < 135/85. This AVERAGE should be calculated from @ least 5-7 BP readings taken @ different times of day on different days of week. You should not respond to isolated BP readings , but rather the AVERAGE for that week .Please bring your  blood pressure cuff to office visits to verify that it is reliable.It  can also be checked against the blood pressure device at the pharmacy. Finger or wrist cuffs are not dependable; an arm cuff is. Fill the  prescription for the BP medication if BP NOT @ goal based on  7 to 14 day average. Please think about quitting smoking. Review the risks we discussed. Please call 1-800-QUIT-NOW [phone]) for free smoking cessation counseling.

## 2013-09-18 ENCOUNTER — Encounter: Payer: Self-pay | Admitting: Internal Medicine

## 2014-02-04 ENCOUNTER — Ambulatory Visit (INDEPENDENT_AMBULATORY_CARE_PROVIDER_SITE_OTHER): Payer: No Typology Code available for payment source | Admitting: Physician Assistant

## 2014-02-04 ENCOUNTER — Encounter: Payer: Self-pay | Admitting: Physician Assistant

## 2014-02-04 VITALS — BP 174/102 | HR 85 | Temp 98.7°F | Resp 16 | Ht 72.0 in | Wt 181.5 lb

## 2014-02-04 DIAGNOSIS — B86 Scabies: Secondary | ICD-10-CM

## 2014-02-04 DIAGNOSIS — I10 Essential (primary) hypertension: Secondary | ICD-10-CM

## 2014-02-04 DIAGNOSIS — L259 Unspecified contact dermatitis, unspecified cause: Secondary | ICD-10-CM

## 2014-02-04 MED ORDER — PREDNISONE 10 MG PO TABS: ORAL_TABLET | ORAL | Status: DC

## 2014-02-04 MED ORDER — PERMETHRIN 5 % EX CREA: 1.0000 "application " | TOPICAL_CREAM | Freq: Once | CUTANEOUS | Status: DC

## 2014-02-04 MED ORDER — LOSARTAN POTASSIUM 50 MG PO TABS: 50.0000 mg | ORAL_TABLET | Freq: Every day | ORAL | Status: DC

## 2014-02-04 NOTE — Patient Instructions (Addendum)
Please take prednisone taper as directed.  Use permethrin cream as instructed -- apply at bedtime.  Wash off 8 hours later. Clean all linens and clothing.  Continue claritin daily.  Cool compresses will be beneficial.  Heat will make itching worse. Return if symptoms are not improving.  Please have losartan filled and take daily.  Follow-up with Dr. Linna Darner in 1 week for blood pressure recheck.

## 2014-02-04 NOTE — Progress Notes (Signed)
Pre visit review using our clinic review tool, if applicable. No additional management support is needed unless otherwise documented below in the visit note/SLS  

## 2014-02-05 DIAGNOSIS — B86 Scabies: Secondary | ICD-10-CM | POA: Insufficient documentation

## 2014-02-05 NOTE — Progress Notes (Signed)
Patient presents to clinic today c/o pruritic rash of upper extremities, torso, groin and buttocks x 1.5 weeks.  Patient is a long-distance Administrator.  Denies exposure to rhus plants.  Denies change to detergent, lotions or hygiene products.  Patient states the itching is extremely intense and he is unable to sleep as a result.  Is causing him significant anxiety.   Past Medical History  Diagnosis Date  . Hypertension   . Colon polyp   . Diverticulosis     Current Outpatient Prescriptions on File Prior to Visit  Medication Sig Dispense Refill  . cloNIDine (CATAPRES) 0.1 MG tablet Take 1 tablet (0.1 mg total) by mouth 2 (two) times daily. 1 by mouth twice daily  180 tablet  3  . loratadine (CLARITIN) 10 MG tablet Take 10 mg by mouth daily as needed. itching       No current facility-administered medications on file prior to visit.    Allergies  Allergen Reactions  . Bupropion Hcl     REACTION: rash @ pressure areas (waist, groin , axillae)  . Penicillins     ? Rash , hives  . Amoxicillin Itching and Rash    Family History  Problem Relation Age of Onset  . Stroke Father   . Melanoma Father   . Stomach cancer Father   . Alcohol abuse Father   . Diabetes Mother   . Breast cancer Sister   . Diabetes Brother   . Colon cancer Neg Hx   . Esophageal cancer Neg Hx   . Rectal cancer Neg Hx     History   Social History  . Marital Status: Married    Spouse Name: N/A    Number of Children: N/A  . Years of Education: N/A   Social History Main Topics  . Smoking status: Current Every Day Smoker -- 2.00 packs/day    Types: Cigarettes  . Smokeless tobacco: Never Used  . Alcohol Use: 14.4 oz/week    24 Cans of beer per week  . Drug Use: No  . Sexual Activity: None   Other Topics Concern  . None   Social History Narrative  . None   Review of Systems - See HPI.  All other ROS are negative.  BP 174/102  Pulse 85  Temp(Src) 98.7 F (37.1 C) (Oral)  Resp 16  Ht 6'  (1.829 m)  Wt 181 lb 8 oz (82.328 kg)  BMI 24.61 kg/m2  SpO2 99%  Physical Exam  Vitals reviewed. Constitutional: He is oriented to person, place, and time and well-developed, well-nourished, and in no distress.  HENT:  Head: Normocephalic and atraumatic.  Eyes: Conjunctivae are normal.  Neck: Neck supple.  Cardiovascular: Normal rate, regular rhythm, normal heart sounds and intact distal pulses.   Pulmonary/Chest: Effort normal and breath sounds normal. No respiratory distress. He has no wheezes. He has no rales. He exhibits no tenderness.  Neurological: He is alert and oriented to person, place, and time.  Skin: Skin is warm and dry.  Presence of a macular rash with evidence of burrowing.  Rash is centered along wrists, fingers, forearms, chest, axilla, groin and intergluteal cleft.  Rash spares palms and soles.  Some excoriation noted, but no evidence of superimposed infection.  Psychiatric: Affect normal.   Assessment/Plan: Scabies Rash seems consistent with scabies infection giving location of rash, burrowing, and intense pruritus.  Rx Permethrin cream.  Im Depo given by nursing.  Steroid taper given.  Patient to take as directed.  HYPERTENSION, ESSENTIAL NOS Please take losartan as directed.  Follow-up with PCP in 2 weeks for a BP recheck.  Encouraged DASH diet.

## 2014-02-05 NOTE — Assessment & Plan Note (Signed)
Rash seems consistent with scabies infection giving location of rash, burrowing, and intense pruritus.  Rx Permethrin cream.  Im Depo given by nursing.  Steroid taper given.  Patient to take as directed.

## 2014-02-05 NOTE — Assessment & Plan Note (Signed)
Please take losartan as directed.  Follow-up with PCP in 2 weeks for a BP recheck.  Encouraged DASH diet.

## 2014-02-18 ENCOUNTER — Encounter: Payer: Self-pay | Admitting: Internal Medicine

## 2014-02-18 ENCOUNTER — Ambulatory Visit (INDEPENDENT_AMBULATORY_CARE_PROVIDER_SITE_OTHER): Payer: No Typology Code available for payment source | Admitting: Internal Medicine

## 2014-02-18 ENCOUNTER — Telehealth: Payer: Self-pay | Admitting: Internal Medicine

## 2014-02-18 VITALS — BP 180/90 | HR 85 | Temp 98.3°F | Wt 183.2 lb

## 2014-02-18 DIAGNOSIS — I10 Essential (primary) hypertension: Secondary | ICD-10-CM

## 2014-02-18 DIAGNOSIS — F17200 Nicotine dependence, unspecified, uncomplicated: Secondary | ICD-10-CM

## 2014-02-18 MED ORDER — LOSARTAN POTASSIUM-HCTZ 100-12.5 MG PO TABS: 1.0000 | ORAL_TABLET | Freq: Every day | ORAL | Status: DC

## 2014-02-18 NOTE — Telephone Encounter (Signed)
Relevant patient education mailed to patient.  

## 2014-02-18 NOTE — Patient Instructions (Addendum)
Minimal Blood Pressure Goal= AVERAGE < 140/90;  Ideal is an AVERAGE < 135/85. This AVERAGE should be calculated from @ least 5-7 BP readings taken @ different times of day on different days of week. You should not respond to isolated BP readings , but rather the AVERAGE for that week .Please bring your  blood pressure cuff to office visits to verify that it is reliable.It  can also be checked against the blood pressure device at the pharmacy. Finger or wrist cuffs are not dependable; an arm cuff is.    Please think about quitting smoking. Review the risks we discussed. Please call 1-800-QUIT-NOW [phone]) for free smoking cessation counseling.

## 2014-02-18 NOTE — Progress Notes (Signed)
   Subjective:    Patient ID: Keith [name], male    DOB: 1956/07/10, 58 y.o.   MRN: [id number]  HPI    He is not monitoring his blood pressure at home. He started losartan 50 mg 2 weeks ago at the advice of Mr. Hassell Done, [place] assistant.  He has continued the clonidine.  He is concerned that he will not be able to pass the ENT exam because pressure.    Review of Systems   Chest pain, palpitations, tachycardia, exertional dyspnea, paroxysmal nocturnal dyspnea, claudication or edema are absent.       Objective:   Physical Exam  Appears healthy and well-nourished & in no acute distress  No carotid bruits are present.No neck pain distention present at 10 - 15 degrees. Thyroid normal to palpation  Heart rhythm and rate are normal with no gallop or murmur  Chest is clear with no increased work of breathing. Decreased breath sounds  There is no evidence of aortic aneurysm or renal artery bruits  Abdomen soft with no organomegaly or masses. No HJR  No clubbing, cyanosis or edema present.  Pedal pulses are intact   No ischemic skin changes are present . Fingernails healthy   Alert and oriented. Strength, tone, DTRs reflexes normal          Assessment & Plan:  #1 HTN; risk discussed #2 smoker  See orders & AVS

## 2014-02-18 NOTE — Progress Notes (Signed)
Pre visit review using our clinic review tool, if applicable. No additional management support is needed unless otherwise documented below in the visit note. 

## 2014-02-18 NOTE — Progress Notes (Signed)
   Subjective:    Patient ID: Noralee Chars, male    DOB: Jan 09, 1956, 58 y.o.   MRN: [id number]  HPI HYPERTENSION Disease Monitoring: Not monitoring at home  Blood pressure range- Not monitoring  Medications. Losarten & Clonidine Compliance- Good   Fasting Hyperglycemia Disease Monitoring: None Blood Sugar ranges- N/A  Medications: None  HYPERLIPIDEMIA Disease Monitoring: Last labs 10/25/11 Medications: None LDL: 143    Smoking Status noted 2 ppd    Review of Systems Denies chest pain, dyspnea, headaches, lightheadedness, edema, RUQ pain, muscle aches,  ,polyuria, polydypsia, polyphagia, new visual problems,  Objective:   Physical Exam  Retake BP was 162/90       Assessment & Plan:

## 2014-03-25 ENCOUNTER — Telehealth: Payer: Self-pay | Admitting: Internal Medicine

## 2014-03-25 NOTE — Telephone Encounter (Signed)
Notified pt with md response.../lmb 

## 2014-03-25 NOTE — Telephone Encounter (Signed)
Patient says the meds (Clonidine, Losartan) are not working. His blood pressure is still high 147/97. He said he needs to get the blood pressure 140/90 to get his medical card.

## 2014-03-25 NOTE — Telephone Encounter (Signed)
Increase Clonidine to 0.1 mg to TWO every 12 hrs. Office visit with your cuff & all meds if not < 140/90

## 2014-04-08 ENCOUNTER — Telehealth: Payer: Self-pay | Admitting: Internal Medicine

## 2014-04-08 DIAGNOSIS — I10 Essential (primary) hypertension: Secondary | ICD-10-CM

## 2014-04-08 MED ORDER — CLONIDINE HCL 0.1 MG PO TABS: 0.1000 mg | ORAL_TABLET | Freq: Two times a day (BID) | ORAL | Status: DC

## 2014-04-08 NOTE — Telephone Encounter (Signed)
Patient states he needs refill on clonidine because we have increased his dosage. His pharmacy told him to contact us. He uses [facility] on [address]. 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 He states he has to have the physical today and it needs to be under 140. Please advise.

## 2014-04-08 NOTE — Telephone Encounter (Signed)
rx sent

## 2014-04-08 NOTE — Telephone Encounter (Signed)
Pt also states that he wanted to come into the office to get his bp cuff calibrated.

## 2014-04-09 ENCOUNTER — Other Ambulatory Visit (INDEPENDENT_AMBULATORY_CARE_PROVIDER_SITE_OTHER): Payer: No Typology Code available for payment source

## 2014-04-09 ENCOUNTER — Encounter: Payer: Self-pay | Admitting: Internal Medicine

## 2014-04-09 ENCOUNTER — Ambulatory Visit (INDEPENDENT_AMBULATORY_CARE_PROVIDER_SITE_OTHER): Payer: No Typology Code available for payment source | Admitting: Internal Medicine

## 2014-04-09 VITALS — BP 154/90 | HR 72 | Temp 97.9°F | Wt 185.1 lb

## 2014-04-09 DIAGNOSIS — I10 Essential (primary) hypertension: Secondary | ICD-10-CM

## 2014-04-09 LAB — BASIC METABOLIC PANEL
BUN: 9 mg/dL (ref 6–23)
CO2: 31 mEq/L (ref 19–32)
Calcium: 9.6 mg/dL (ref 8.4–10.5)
Chloride: 96 mEq/L (ref 96–112)
Creatinine, Ser: 1.1 mg/dL (ref 0.4–1.5)
GFR: 71.02 mL/min (ref >60.00)
GLUCOSE: 163 mg/dL — AB (ref 70–99)
POTASSIUM: 4.5 meq/L (ref 3.5–5.1)
SODIUM: 134 meq/L — AB (ref 135–145)

## 2014-04-09 MED ORDER — METOPROLOL TARTRATE 25 MG PO TABS: 25.0000 mg | ORAL_TABLET | Freq: Two times a day (BID) | ORAL | Status: DC

## 2014-04-09 NOTE — Progress Notes (Signed)
Pre visit review using our clinic review tool, if applicable. No additional management support is needed unless otherwise documented below in the visit note. 

## 2014-04-09 NOTE — Progress Notes (Signed)
   Subjective:    Patient ID: Keith [name], male    DOB: 05-Apr-1956, 58 y.o.   MRN: [id number]  HPI    His blood pressures at home ranges 127/68-180/110. He has been compliant with his medications and has no adverse effects  He continues to smoke a pack a day.  He describes occasional cramps in his legs but no other cardiovascular symptoms.    Review of Systems   Chest pain, palpitations, tachycardia, exertional dyspnea, paroxysmal nocturnal dyspnea, claudication or edema are absent.       Objective:   Physical Exam   Positive or pertinent findings include: Pattern alopecia is present. He has a Engineering geologist. Decreased breath sounds.  Appears healthy and well-nourished & in no acute distress  No carotid bruits are present.No neck pain distention present at 10 - 15 degrees. Thyroid normal to palpation  Heart rhythm and rate are normal with no gallop or murmur  Chest is clear with no increased work of breathing  There is no evidence of aortic aneurysm or renal artery bruits  Abdomen soft with no organomegaly or masses. No HJR  No clubbing, cyanosis or edema present.  Pedal pulses are intact   No ischemic skin changes are present . Fingernails healthy   Alert and oriented. Strength, tone, DTRs reflexes normal         Assessment & Plan:  #1 HTN Plan: see orders

## 2014-04-09 NOTE — Patient Instructions (Signed)
Minimal Blood Pressure Goal= AVERAGE < 140/90;  Ideal is an AVERAGE < 135/85. This AVERAGE should be calculated from @ least 5-7 BP readings taken @ different times of day on different days of week. You should not respond to isolated BP readings , but rather the AVERAGE for that week .Please bring your  blood pressure cuff to office visits to verify that it is reliable.It  can also be checked against the blood pressure device at the pharmacy. Finger or wrist cuffs are not dependable; an arm cuff is.  Please think about quitting smoking. Review the risks we discussed. Please call 1-800-QUIT-NOW (1-800-784-8669) for free smoking cessation counseling. There are multiple options are to help you stop smoking. These include nicotine patches, nicotine gum, and the new "E cigarette".   

## 2014-04-10 ENCOUNTER — Telehealth: Payer: Self-pay | Admitting: *Deleted

## 2014-04-10 NOTE — Telephone Encounter (Signed)
Left msg on triage stating md rx metoprolol yesterday. He has taken 3 dose of med along with taking his clonidine & losartan. Wanting to know how long does it take for the metoprolol to take effect...Johny Chess

## 2014-04-10 NOTE — Telephone Encounter (Signed)
Notified pt with md response.../lmb 

## 2014-04-10 NOTE — Telephone Encounter (Signed)
Can double dose after 4 days if not down

## 2014-04-15 LAB — ALDOSTERONE + RENIN ACTIVITY W/ RATIO
ALDO / PRA Ratio: 5.3 Ratio (ref 0.9–28.9)
ALDOSTERONE: 4 ng/dL
PRA LC/MS/MS: 0.76 ng/mL/h (ref 0.25–5.82)

## 2014-06-01 ENCOUNTER — Other Ambulatory Visit: Payer: Self-pay | Admitting: Internal Medicine

## 2014-06-03 NOTE — Telephone Encounter (Signed)
Patient is requesting metoprolol and clonidine.  Patient also states that metoprolol dosage was changed but quantity was not changed therefore causing him to not have a 30 day supply.  He is requesting an adjustment.

## 2014-06-10 ENCOUNTER — Other Ambulatory Visit: Payer: Self-pay | Admitting: Internal Medicine

## 2014-06-10 ENCOUNTER — Other Ambulatory Visit (INDEPENDENT_AMBULATORY_CARE_PROVIDER_SITE_OTHER): Payer: No Typology Code available for payment source

## 2014-06-10 DIAGNOSIS — R7309 Other abnormal glucose: Secondary | ICD-10-CM

## 2014-06-10 LAB — HEMOGLOBIN A1C: Hgb A1c MFr Bld: 6.4 % (ref 4.6–6.5)

## 2014-08-12 ENCOUNTER — Telehealth: Payer: Self-pay | Admitting: Internal Medicine

## 2014-08-12 DIAGNOSIS — I10 Essential (primary) hypertension: Secondary | ICD-10-CM

## 2014-08-12 MED ORDER — LOSARTAN POTASSIUM-HCTZ 100-12.5 MG PO TABS: 1.0000 | ORAL_TABLET | Freq: Every day | ORAL | Status: DC

## 2014-08-12 NOTE — Telephone Encounter (Signed)
Pt request refill for Losartan, pt has 8-9 pill left. Please advise, he didn't want to run out that's why he call.

## 2014-09-20 ENCOUNTER — Telehealth: Payer: Self-pay | Admitting: Family Medicine

## 2014-09-20 ENCOUNTER — Other Ambulatory Visit: Payer: Self-pay | Admitting: Internal Medicine

## 2014-09-20 MED ORDER — METOPROLOL TARTRATE 25 MG PO TABS: 25.0000 mg | ORAL_TABLET | Freq: Two times a day (BID) | ORAL | Status: DC

## 2014-09-20 MED ORDER — CLONIDINE HCL 0.1 MG PO TABS: 0.1000 mg | ORAL_TABLET | Freq: Two times a day (BID) | ORAL | Status: DC

## 2014-09-20 NOTE — Telephone Encounter (Signed)
Pt called on call MD. Dids not receive requested refills. pt upset and angry. Going out of town in next few days. Refilled x 90 days.

## 2014-09-21 NOTE — Telephone Encounter (Signed)
Oh no problem! I completely understand and I am in the same pile!!!

## 2014-09-21 NOTE — Telephone Encounter (Signed)
Amy , I am sorry you got involved. I did not see request (although it may be buried in the 3 inches of paper work sitting beside me; I am just glad we are "paperless") He unfortunately is more often "angry" than not. He  only comes in  when evaluation mandated for refills. [employer]

## 2014-09-24 ENCOUNTER — Telehealth: Payer: Self-pay | Admitting: Internal Medicine

## 2014-09-24 MED ORDER — METOPROLOL TARTRATE 25 MG PO TABS: 50.0000 mg | ORAL_TABLET | Freq: Two times a day (BID) | ORAL | Status: DC

## 2014-09-24 MED ORDER — CLONIDINE HCL 0.1 MG PO TABS: 0.2000 mg | ORAL_TABLET | Freq: Two times a day (BID) | ORAL | Status: DC

## 2014-09-24 NOTE — Telephone Encounter (Signed)
Pt called stated that Dr. Linna Darner change his direction for cloNIDine (CATAPRES) 0.1 MG tablet and metoprolol tartrate (LOPRESSOR) 25 MG tablet 2 pill twice a day, please change the direction on our end and send in new Rx. Please give him a call, he need to speak to the assistant about this.

## 2014-09-24 NOTE — Telephone Encounter (Signed)
Please change & refill X 3 mos

## 2014-09-25 ENCOUNTER — Telehealth: Payer: Self-pay | Admitting: *Deleted

## 2014-09-25 NOTE — Telephone Encounter (Signed)
[facility] Night - Client TELEPHONE Keith [name] Patient Name: Keith [name] Gender: Male DOB: Jul 02, 1956 Age: 58 Y 25 M 18 D Return Phone Number: [phone] (Primary), [phone] (Secondary) Address[place] [address] Client [place] Night - Client Client Site [place] - Night Physician [name] Contact Type Call Call Type Triage / Clinical Relationship To Patient Self Return Phone Number [phone] (Primary) Chief Complaint Prescription Refill or Medication Request (non symptomatic) Initial Comment Caller states he is running out of blood pressure medication. Spoke with office earlier today and Rx is not at the pharmacy. Nurse Assessment Guidelines Guideline Title Affirmed Question Affirmed Notes Nurse Date/Time (Eastern Time) Disp. Time Eilene Ghazi Time) Disposition Final User 09/20/2014 8:51:48 PM Send To RN Personal Burt Ek, RN, [name] 09/20/2014 9:30:46 PM Paged On Call back to [facility], RN, [name] 09/20/2014 10:03:13 PM Clinical Call Yes Burt Ek, RN, [name] After Care Instructions Given Call Event Type User Date / Time Description Comments User: [name], RN Date/Time Eilene Ghazi Time): 09/20/2014 10:02:55 PM Contacted the on call MD for medication refill per caller request, on call MD informed that medication refill sent in while on the phone, refill medication are Clonidine 0.1mg  by mouth twice daily with no refills and Metoprolol 25mg  by mouth twice daily with no refills, both medications filled for 90 days, informed caller that MD sent medication refill in to pharmacy, caller verbalized understanding. [place] DateTime Result/Outcome Notes [name] [phone] 09/20/2014 9:30:46 PM Paged On Call Back to Call [name] Please call [phone] [name] 09/20/2014 9:53:30 PM Spoke with On Call - General

## 2014-09-25 NOTE — Telephone Encounter (Signed)
[place] Night - Client TELEPHONE [facility] Patient Name: Keith [name] Gender: Male DOB: Jun 20, 1956 Age: 59 Y 70 M 19 D Return Phone Number: [phone] (Primary), [phone] (Secondary) Address[place] [address] Client [place] Night - Client Client Site [place] - Night Physician [name] Contact Type Call Call Type Triage / Clinical Relationship To Patient Self Return Phone Number [phone] (Primary) Chief Complaint Prescription Refill or Medication Request (non symptomatic) Initial Comment Caller states only one rx called in to pharm, supposed to be 2. Clonidine was not called in Nurse Assessment Guidelines Guideline Title Affirmed Question Affirmed Notes Nurse Date/Time (Eastern Time) Disp. Time Eilene Ghazi Time) Disposition Final User 09/21/2014 9:35:31 PM FINAL ATTEMPT MADE - message left Yes Dimas Chyle, RN, Dellis Filbert After Care Instructions Given Call Event Type User Date / Time Description

## 2014-09-25 NOTE — Telephone Encounter (Signed)
[place] Night - Client TELEPHONE [facility] Patient Name: Keith [name] Gender: Male DOB: 06-05-1956 Age: 59 Y 39 M 18 D Return Phone Number: [phone] (Primary), [phone] (Secondary) Address[place] [address] Client [place] Night - Client Client Site [place] - Night Physician [name] Contact Type Call Call Type Triage / Clinical Relationship To Patient Self Return Phone Number [phone] (Primary) Chief Complaint Prescription Refill or Medication Request (non symptomatic) Initial Comment Caller states he is running out of blood pressure medication. Spoke with office earlier today and Rx is not at the pharmacy. Nurse Assessment Guidelines Guideline Title Affirmed Question Affirmed Notes Nurse Date/Time (Eastern Time) Disp. Time Eilene Ghazi Time) Disposition Final User 09/20/2014 8:51:48 PM Send To RN Personal Burt Ek, RN, [name] 09/20/2014 9:30:46 PM Paged On Call back to [facility], RN, [name] 09/20/2014 10:03:13 PM Clinical Call Yes Burt Ek, RN, [name] After Care Instructions Given Call Event Type User Date / Time Description Comments User: [name], RN Date/Time Eilene Ghazi Time): 09/20/2014 10:02:55 PM Contacted the on call MD for medication refill per caller request, on call MD informed that medication refill sent in while on the phone, refill medication are Clonidine 0.1mg  by mouth twice daily with no refills and Metoprolol 25mg  by mouth twice daily with no refills, both medications filled for 90 days, informed caller that MD sent medication refill in to pharmacy, caller verbalized understanding. [place] DateTime Result/Outcome Notes [name] [phone] 09/20/2014 9:30:46 PM Paged On Call Back to Call Center Please call [phone] [name] 09/20/2014 9:53:30 PM Spoke with On Call - General

## 2014-12-27 ENCOUNTER — Encounter: Payer: Self-pay | Admitting: Internal Medicine

## 2014-12-27 ENCOUNTER — Telehealth: Payer: Self-pay | Admitting: Internal Medicine

## 2014-12-27 ENCOUNTER — Ambulatory Visit (INDEPENDENT_AMBULATORY_CARE_PROVIDER_SITE_OTHER): Payer: No Typology Code available for payment source | Admitting: Internal Medicine

## 2014-12-27 VITALS — BP 162/94 | HR 56 | Temp 98.0°F | Resp 14 | Wt 175.5 lb

## 2014-12-27 DIAGNOSIS — M545 Low back pain, unspecified: Secondary | ICD-10-CM

## 2014-12-27 DIAGNOSIS — I10 Essential (primary) hypertension: Secondary | ICD-10-CM

## 2014-12-27 DIAGNOSIS — R739 Hyperglycemia, unspecified: Secondary | ICD-10-CM | POA: Diagnosis not present

## 2014-12-27 MED ORDER — CLONIDINE HCL 0.1 MG PO TABS: 0.2000 mg | ORAL_TABLET | Freq: Two times a day (BID) | ORAL | Status: DC

## 2014-12-27 MED ORDER — LOSARTAN POTASSIUM-HCTZ 100-12.5 MG PO TABS: 1.0000 | ORAL_TABLET | Freq: Every day | ORAL | Status: DC

## 2014-12-27 MED ORDER — TRAMADOL HCL 50 MG PO TABS: 50.0000 mg | ORAL_TABLET | Freq: Three times a day (TID) | ORAL | Status: DC | PRN

## 2014-12-27 MED ORDER — METHOCARBAMOL 500 MG PO TABS: ORAL_TABLET | ORAL | Status: DC

## 2014-12-27 MED ORDER — METOPROLOL TARTRATE 25 MG PO TABS: 50.0000 mg | ORAL_TABLET | Freq: Two times a day (BID) | ORAL | Status: DC

## 2014-12-27 NOTE — Progress Notes (Signed)
   Subjective:    Patient ID: Keith [name], male    DOB: [birth year], 59 y.o.   MRN: [id number]  HPI 12/16/14 in the afternoon he began to have sharp lumbosacral area pain up to level 9.5 which was constant. He had roto-tilled his garden 5/8 but did not have pain that evening. The pain was worse with movement, ambulation, or cough. He used Aleve for 2 days without benefit. As of 5/16 there was some improvement and it was sharp and only up to level III and only with movement.  He's had some suprapubic "gassy" discomfort. He's had no other GI or GU symptoms. He did have some soreness in the right testicle in the past week.  He has not taken his meds this afternoon. He is a Engineer, maintenance and eats on the road. He tries to avoid salt. Blood pressure varies from 126/83 up to 167/98 if he has not taken his medicines. He has no cardiovascular symptoms.   Review of Systems He denies any weakness, numbness, tingling in the lower extremities. He has no balance dysfunction. There's been no change in color or temperature of the skin in the area of pain. He has no loss of control of bladder or bowels. He has no abnormal bruising or bleeding   He denies fever, chills, sweats, unexplained weight loss, excessive fatigue. He's had no melena, rectal bleeding, or bowel changes such as diarrhea or constipation.  There's been no dysuria, pyuria, hematuria.   He has had hyperglycemia in the past.A1c was 6.4% in 11/15.No polyuria, polydipsia or polyphagia.      Objective:   Physical Exam Pertinent or positive findings include: He has pattern alopecia. He wears a goatee. He is Geneticist, molecular.  He has some discomfort to percussion over the lumbosacral spine.  Dorsalis pedis pulses are decreased.  There is lateral deviation of the second left toe.  Genitourinary exam reveals a granuloma and variceal in the left scrotum. There is no tenderness or pain to palpation. Heel, toe walking are normal. He's able to lie  flat and sit up without help. Straight leg raising is negative.  General appearance :adequately nourished; in no distress. Eyes: No conjunctival inflammation or scleral icterus is present. Oral exam:  Lips and gums are healthy appearing.There is no oropharyngeal erythema or exudate noted. Dental hygiene is good. Heart:  Normal rate and regular rhythm. S1 and S2 normal without gallop, murmur, click, rub or other extra sounds   Lungs:Chest clear to auscultation; no wheezes, rhonchi,rales ,or rubs present.No increased work of breathing.  Abdomen: bowel sounds normal, soft and non-tender without masses, organomegaly or hernias noted.  No guarding or rebound. No flank or LS tenderness to percussion. Vascular : all pulses equal ; no bruits present. Skin:Warm & dry.  Intact without suspicious lesions or rashes ; no tenting or jaundice  Lymphatic: No lymphadenopathy is noted about the head, neck, axilla, or inguinal areas.  Neuro: Strength, tone & DTRs normal.         Assessment & Plan:  #1 acute low back syndrome, musculoskeletal. No neurologic deficit or evidence of acute disc. #2HTN Plan: See orders and recommendations.ng, stretch aerobics, and yoga.

## 2014-12-27 NOTE — Progress Notes (Signed)
Pre visit review using our clinic review tool, if applicable. No additional management support is needed unless otherwise documented below in the visit note. 

## 2014-12-27 NOTE — Telephone Encounter (Signed)
Called pt, he is coming at 3:45. He said that he will be here around 2:30 just in case.

## 2014-12-27 NOTE — Telephone Encounter (Signed)
Please advise, thanks.

## 2014-12-27 NOTE — Patient Instructions (Addendum)
Minimal Blood Pressure Goal= AVERAGE < 140/90;  Ideal is an AVERAGE < 135/85. This AVERAGE should be calculated from @ least 5-7 BP readings taken @ different times of day on different days of week. You should not respond to isolated BP readings , but rather the AVERAGE for that week .Please bring your  blood pressure cuff to office visits to verify that it is reliable.It  can also be checked against the blood pressure device at the pharmacy. Finger or wrist cuffs are not dependable; an arm cuff is.  The best exercises for the low back include freestyle swimmi

## 2014-12-27 NOTE — Telephone Encounter (Signed)
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 He was hoping to get in to see Dr. Linna Darner while he is here. Just wondering if he can work him in late this afternoon or should he maybe go to urgent care. Or maybe come in for some lab work.

## 2014-12-27 NOTE — Telephone Encounter (Signed)
If he can come in @ 3:45 pm ; I'll see him before I leave.If he can't ,I recommend UC ( [facility] or [facility] )

## 2015-01-15 ENCOUNTER — Encounter: Payer: Self-pay | Admitting: Internal Medicine

## 2015-01-15 ENCOUNTER — Ambulatory Visit (INDEPENDENT_AMBULATORY_CARE_PROVIDER_SITE_OTHER): Payer: No Typology Code available for payment source | Admitting: Internal Medicine

## 2015-01-15 VITALS — BP 158/96 | HR 57 | Temp 98.3°F | Resp 16 | Wt 172.0 lb

## 2015-01-15 DIAGNOSIS — M5416 Radiculopathy, lumbar region: Secondary | ICD-10-CM

## 2015-01-15 DIAGNOSIS — M79672 Pain in left foot: Secondary | ICD-10-CM

## 2015-01-15 MED ORDER — CELECOXIB 100 MG PO CAPS: 100.0000 mg | ORAL_CAPSULE | Freq: Two times a day (BID) | ORAL | Status: DC

## 2015-01-15 MED ORDER — GABAPENTIN 100 MG PO CAPS: ORAL_CAPSULE | ORAL | Status: DC

## 2015-01-15 NOTE — Progress Notes (Signed)
   Subjective:    Patient ID: Keith [name], male    DOB: 07-Apr-1956, 59 y.o.   MRN: [id number]  HPI He is experiencing pain in the right thigh to the knee described as aching up to level VII. The original symptoms began after using a Rototiller 12/15/14 in his yard. He was seen 12/27/14 &  the tramadol & muscle relaxant prescribed.  He sustained an additional injury 2 weeks ago when he slipped in the shower. He's had pain in the left foot since that fall. Ice and elevation do help.  There was no neurologic or cardiovascular prodrome prior to the fall but he had drunk a 12 pack of beer over a 6 hour period prior to the fall.  He has some numbness and tingling in the right lower extremity from the knee to the ankle but no other neuromuscular symptoms.  Review of Systems Fever, chills, sweats, or unexplained weight loss not present. Vertigo, near syncope or imbalance denied. No loss of control of bladder or bowels. No seizure stigmata.    Objective:   Physical Exam Pertinent or positive findings include:  He has operative deformity of the right ear. Pattern baldness is present. Goatee.Breath sounds are decreased. The deep tendon reflexes @ the right knee is not elicitable. He has faint ecchymosis over the third and fourth left toes. There is tenderness to compression of those two toes.   General appearance :adequately nourished; in no distress.  Eyes: No conjunctival inflammation or scleral icterus is present.  Oral exam:  Lips and gums are healthy appearing.There is no oropharyngeal erythema or exudate noted.   Heart:  Normal rate and regular rhythm. S1 and S2 normal without gallop, murmur, click, rub or other extra sounds    Lungs:Chest clear to auscultation; no wheezes, rhonchi,rales ,or rubs present.No increased work of breathing.   Abdomen: bowel sounds normal, soft and non-tender without masses, organomegaly or hernias noted.  No guarding or rebound. No flank tenderness to  percussion.  Vascular : all pulses equal ; no bruits present.  Skin:Warm & dry.  Intact without suspicious lesions or rashes ; no tenting or jaundice   Lymphatic: No lymphadenopathy is noted about the head, neck, axilla Neurologic exam : Strength equal & normal in upper & lower extremities Able to walk on heels and toes but describes pain in injured toes with heel walking.   Balance normal  Neg SLR       Assessment & Plan:  #1 lumbar radiculopathy,? L 1-2 #2 foot injury;clinically no fracture or displacement See orders

## 2015-01-15 NOTE — Patient Instructions (Addendum)
Please remain out of work until June 9,2016  The best exercises for the low back include freestyle swimming, stretch aerobics, and yoga.Cybex & Nautilus machines rather than dead weights are better for the back.  Buddy tape the involved digit to the non involved digit if having pain as discussed.

## 2015-01-15 NOTE — Progress Notes (Signed)
Pre visit review using our clinic review tool, if applicable. No additional management support is needed unless otherwise documented below in the visit note. 

## 2015-01-16 ENCOUNTER — Telehealth: Payer: Self-pay | Admitting: Internal Medicine

## 2015-01-16 ENCOUNTER — Ambulatory Visit (INDEPENDENT_AMBULATORY_CARE_PROVIDER_SITE_OTHER)
Admission: RE | Admit: 2015-01-16 | Discharge: 2015-01-16 | Disposition: A | Discharge status: Home / Self Care | Payer: No Typology Code available for payment source | Source: Ambulatory Visit | Attending: Internal Medicine | Admitting: Internal Medicine

## 2015-01-16 DIAGNOSIS — M5416 Radiculopathy, lumbar region: Secondary | ICD-10-CM

## 2015-01-16 NOTE — Telephone Encounter (Signed)
OK please type note

## 2015-01-16 NOTE — Telephone Encounter (Signed)
Please advise, thanks.

## 2015-01-16 NOTE — Telephone Encounter (Signed)
Is there something else that will work better---patient is not driving or working right now---celebrex and neurotin are not helping at all---meds were started at 11pm last night

## 2015-01-16 NOTE — Telephone Encounter (Signed)
Pt request to be to be out of work in until Monday 01/20/15 due to the pain that he experiencing. Please advise, pt was suppose to go back to work today per [employer] but he can not focus at work. Please call pt

## 2015-01-16 NOTE — Telephone Encounter (Signed)
The next medication would be tramadol and he is already taking this. It may take a day or so for the Celebrex to work.

## 2015-01-16 NOTE — Telephone Encounter (Signed)
Please call patient regarding medication he was prescribed yesterday and the pain he is having. Best number is [phone]

## 2015-01-17 ENCOUNTER — Other Ambulatory Visit: Payer: Self-pay | Admitting: Family

## 2015-01-17 MED ORDER — ACETAMINOPHEN-CODEINE #3 300-30 MG PO TABS: 1.0000 | ORAL_TABLET | Freq: Three times a day (TID) | ORAL | Status: DC | PRN

## 2015-01-17 MED ORDER — PREDNISONE 10 MG PO TABS
ORAL_TABLET | ORAL | Status: DC
Start: 2015-01-17 — End: 2015-01-23

## 2015-01-17 NOTE — Telephone Encounter (Signed)
We have faxed rx for tylenol 3 to pharm x2, i called and left message advising patient to call us back if pharm still does not have rx, we can print rx here at office and patient can pick up to carry to pharm----left message advising patient to call office if he did not get tylenol 3 at the pharm and will print script for him here to pick up at our office

## 2015-01-17 NOTE — Telephone Encounter (Signed)
Pt called in and said that pharmacy had the prednisone but they did not get the acetaminophen-codeine (TYLENOL #3) 300-30 MG per tablet [[id number]] .  Can this be resent?

## 2015-01-17 NOTE — Telephone Encounter (Signed)
Patient called back and stated he is currently not taking tramadol, because tramadol was prescribed earlier and did not work----per greg calone, he is calling in tylenol 3 and prednisone for patient to try, patient has been advised not to take tramadol or any other form of tylenol (including OTC tylenol) with these new rx meds---patient repeated back for understanding.  Patient also advised that if he still did not get any relief from pain after 24 hours, to seek care with Emerg Dept or Urgent Care----I also made follow up appt for Dr hopper on Thursday 6/16 at 8:00am

## 2015-01-23 ENCOUNTER — Ambulatory Visit (INDEPENDENT_AMBULATORY_CARE_PROVIDER_SITE_OTHER): Payer: No Typology Code available for payment source | Admitting: Internal Medicine

## 2015-01-23 ENCOUNTER — Encounter: Payer: Self-pay | Admitting: Internal Medicine

## 2015-01-23 VITALS — BP 146/84 | HR 68 | Temp 98.0°F | Resp 16 | Wt 174.0 lb

## 2015-01-23 DIAGNOSIS — I10 Essential (primary) hypertension: Secondary | ICD-10-CM | POA: Diagnosis not present

## 2015-01-23 DIAGNOSIS — M5416 Radiculopathy, lumbar region: Secondary | ICD-10-CM | POA: Diagnosis not present

## 2015-01-23 DIAGNOSIS — F17200 Nicotine dependence, unspecified, uncomplicated: Secondary | ICD-10-CM

## 2015-01-23 DIAGNOSIS — Z720 Tobacco use: Secondary | ICD-10-CM

## 2015-01-23 MED ORDER — ACETAMINOPHEN-CODEINE #3 300-30 MG PO TABS: 1.0000 | ORAL_TABLET | Freq: Three times a day (TID) | ORAL | Status: DC | PRN

## 2015-01-23 NOTE — Progress Notes (Signed)
Pre visit review using our clinic review tool, if applicable. No additional management support is needed unless otherwise documented below in the visit note. 

## 2015-01-23 NOTE — Assessment & Plan Note (Signed)
MRI of LS spine 

## 2015-01-23 NOTE — Progress Notes (Signed)
   Subjective:    Patient ID: Keith [name], male    DOB: September 16, 1955, 59 y.o.   MRN: [id number]  HPI  His symptoms are markedly variable in intensity. He called 01/16/15 because pain was severe enough that he infers he considered self-harm. He was prescribed Tylenol with codeine which he's been taking as 2 pills twice a day. He does feel the prednisone was most beneficial. He's been taking gabapentin only 100 mg at bedtime. He has been able to sleep. The pain is worse in the morning but seems improved with ambulation;but if he is physically active it is exacerbated.   He describes a constant ache "like a muscle cramp" up to level IV in the lumbosacral spine area.   He indicates he is anxious to be released to work and yet requires narcotics for pain control. It was explained to him that he cannot take narcotics and drive.  He has constant numbness and tingling in the right lower leg medially an L4 distribution.   The films were reviewed with him. There is an old compression fracture of L1 anteriorly. Alignment is described as normal. There does appear to be some malalignment at L4. There is also disc space narrowing at L4-5 posteriorly.  He rarely monitors his blood pressure. He has no active cardiovascular symptoms.  Review of Systems Fever, chills, sweats, or unexplained weight loss not present. No significant headaches. Mental status change or memory loss denied. Blurred vision , diplopia or vision loss absent. Vertigo, near syncope or imbalance denied. No loss of control of bladder or bowels. Chest pain, palpitations, tachycardia, exertional dyspnea, paroxysmal nocturnal dyspnea, claudication or edema are absent.  His initial blood pressure was 172/100. He did take his blood pressure medicines approximate 90 minutes before arriving. He also smoked prior to the office visit. Serial blood pressures were checked. These were 160/88 and 146/84       Objective:   Physical  Exam  Pertinent or positive findings include: Is a deformity of the right ear. Breath sounds are decreased. Deep tendon reflexes 0+ at the right knee. Straight leg raising is negative bilaterally. He is able to lie flat and sit up without help. His initial blood pressure was 172/100. He did take his blood pressure medicines approximate 90 minutes before arriving. He also smoked prior to the office visit. Serial blood pressures were checked. These were 160/88 and 146/84  General appearance :adequately nourished; in no distress.  Eyes: No conjunctival inflammation or scleral icterus is present.  Heart:  Normal rate and regular rhythm. S1 and S2 normal without gallop, murmur, click, rub or other extra sounds    Lungs:Chest clear to auscultation; no wheezes, rhonchi,rales ,or rubs present.No increased work of breathing.   Abdomen: bowel sounds normal, soft and non-tender without masses, organomegaly or hernias noted.  No guarding or rebound.   Vascular : all pulses equal ; no bruits present.  Skin:Warm & dry.  Intact without suspicious lesions or rashes ; no tenting or jaundice   Lymphatic: No lymphadenopathy is noted about the head, neck, axilla  Neuro: Strength, tone  normal.        Assessment & Plan:  #1 lumbar radiculopathy Plan: MRI Gabapentin titration   If there is evidence of nerve root impingement; neurosurgical referral. If there is none; referral to nonoperative back specialist will be completed.  #2 HTN; risk discussed  #3 smoking ; risk discussed

## 2015-01-23 NOTE — Patient Instructions (Addendum)
The MRI will be scheduled and you'll be notified of the time.Please call the Referral Co-Ordinator @ [phone] if you have not been notified of appointment time within 5 days. If Gabapentin is partially beneficial, it can be increased up to a total of 3 pills @ bedtime as needed. This increase of 1 pill each dose  should take place over 72 hours at least.If 300 mg is effective dose ; there is a 300 mg pill.  Minimal Blood Pressure Goal= AVERAGE < 140/90;  Ideal is an AVERAGE < 135/85. This AVERAGE should be calculated from @ least 5-7 BP readings taken @ different times of day on different days of week. You should not respond to isolated BP readings , but rather the AVERAGE for that week .Please bring your  blood pressure cuff to office visits to verify that it is reliable.It  can also be checked against the blood pressure device at the pharmacy. Finger or wrist cuffs are not dependable; an arm cuff is.  Please think about quitting smoking. Review the risks we discussed. Please call 1-800-QUIT-NOW (437)076-1657) for free smoking cessation counseling.

## 2015-01-25 ENCOUNTER — Ambulatory Visit
Admission: RE | Admit: 2015-01-25 | Discharge: 2015-01-25 | Disposition: A | Discharge status: Home / Self Care | Payer: No Typology Code available for payment source | Source: Ambulatory Visit | Attending: Internal Medicine | Admitting: Internal Medicine

## 2015-01-25 ENCOUNTER — Other Ambulatory Visit: Payer: Self-pay | Admitting: Internal Medicine

## 2015-01-25 DIAGNOSIS — M5416 Radiculopathy, lumbar region: Secondary | ICD-10-CM

## 2015-01-27 ENCOUNTER — Encounter: Payer: Self-pay | Admitting: Emergency Medicine

## 2015-01-27 ENCOUNTER — Telehealth: Payer: Self-pay | Admitting: Internal Medicine

## 2015-01-27 NOTE — Telephone Encounter (Signed)
Please call patient regarding his MRI results

## 2015-01-27 NOTE — Telephone Encounter (Signed)
Pt note printed to return to work without any restrictions. Pt advised that in order to return to work he is not taking any narcotic pain meds and can take Gabapentin at night only. Pt plans to pick note up on 01/27/2015 to return to work on 01/28/15

## 2015-01-27 NOTE — Telephone Encounter (Signed)
   He can be cleared to return to work if he is not taking narcotic pain medicine. Gabapentin could be taken at bedtime but not during the day. The referral for the neurosurgeon was placed 6/19.

## 2015-01-27 NOTE — Telephone Encounter (Signed)
Results were sent to pt in mail this morning. Pt called in asking for results. Results were given, pt stated he has not taken any tylenol 3 since Thursday 01/23/15, and doesn't have any pain as of today. Pt is concerned about needing to go back to work. Pt agreed to have neurosurgical consultation, but will need referral? Please advise.

## 2015-01-30 ENCOUNTER — Telehealth: Payer: Self-pay | Admitting: Internal Medicine

## 2015-01-30 NOTE — Telephone Encounter (Signed)
Pt called and said that the last 2 days he has soiled his self and cant get to the restroom in time.  He thinks it is the meds that he is on. He wants to come off of them if it is cause him to do this?      Best number [phone]

## 2015-01-30 NOTE — Telephone Encounter (Signed)
Informed pt to stop taking gabapentin and only take celebrex at little as possible. Pt stated he had not taking tramadol.

## 2015-01-30 NOTE — Telephone Encounter (Signed)
Stop tramadol and gabapentin.  Continue to monitor your blood pressure; minimal goal is less than 140/90.

## 2015-01-30 NOTE — Telephone Encounter (Signed)
Please advise 

## 2015-02-03 ENCOUNTER — Other Ambulatory Visit: Payer: Self-pay | Admitting: Emergency Medicine

## 2015-02-03 ENCOUNTER — Telehealth: Payer: Self-pay | Admitting: Internal Medicine

## 2015-02-03 DIAGNOSIS — M5416 Radiculopathy, lumbar region: Secondary | ICD-10-CM

## 2015-02-03 MED ORDER — CELECOXIB 100 MG PO CAPS: 100.0000 mg | ORAL_CAPSULE | Freq: Two times a day (BID) | ORAL | Status: DC

## 2015-02-03 NOTE — Telephone Encounter (Signed)
Patient is requesting refill on celebrex to be sent to [facility] on [address]

## 2015-02-03 NOTE — Telephone Encounter (Signed)
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  He is leaving out later on today.  He is requesting script to be sent as soon as possible.

## 2015-02-03 NOTE — Telephone Encounter (Signed)
OK #30 BUT:  A new report indicates that glucosamine/chondroitin is is effective for arthritic pain as is Celebrex. The only contraindication to this supplement would be a shellfish allergy.  Celebrex should be taken as infrequently as possible for the shortest period of time because of potential long-term stomach and heart risk.

## 2015-02-03 NOTE — Telephone Encounter (Signed)
Sent in RX for Celebrex, informed pt to try glucosamine/chondroitin for arthritis.

## 2015-02-03 NOTE — Telephone Encounter (Signed)
Pt is requesting a refill for Celebrex. Spoke with patient last week telling him take Celebrex as little as possible.

## 2015-02-28 ENCOUNTER — Telehealth: Payer: Self-pay | Admitting: Internal Medicine

## 2015-02-28 NOTE — Telephone Encounter (Signed)
Patient is requesting refills for cloNIDine (CATAPRES) 0.1 MG tablet [[id number] and metoprolol tartrate (LOPRESSOR) 25 MG tablet [[id number]]  Pharmacy [facility] on [address]

## 2015-03-03 ENCOUNTER — Other Ambulatory Visit: Payer: Self-pay | Admitting: Emergency Medicine

## 2015-03-03 DIAGNOSIS — I10 Essential (primary) hypertension: Secondary | ICD-10-CM

## 2015-03-03 MED ORDER — METOPROLOL TARTRATE 25 MG PO TABS: 50.0000 mg | ORAL_TABLET | Freq: Two times a day (BID) | ORAL | Status: DC

## 2015-03-03 MED ORDER — CLONIDINE HCL 0.1 MG PO TABS: 0.2000 mg | ORAL_TABLET | Freq: Two times a day (BID) | ORAL | Status: DC

## 2015-04-01 ENCOUNTER — Encounter: Payer: Self-pay | Admitting: Emergency Medicine

## 2015-04-01 ENCOUNTER — Encounter: Payer: Self-pay | Admitting: Internal Medicine

## 2015-04-01 ENCOUNTER — Other Ambulatory Visit (INDEPENDENT_AMBULATORY_CARE_PROVIDER_SITE_OTHER): Payer: No Typology Code available for payment source

## 2015-04-01 ENCOUNTER — Ambulatory Visit (INDEPENDENT_AMBULATORY_CARE_PROVIDER_SITE_OTHER): Payer: No Typology Code available for payment source | Admitting: Internal Medicine

## 2015-04-01 ENCOUNTER — Other Ambulatory Visit: Payer: Self-pay | Admitting: Internal Medicine

## 2015-04-01 ENCOUNTER — Ambulatory Visit (INDEPENDENT_AMBULATORY_CARE_PROVIDER_SITE_OTHER)
Admission: RE | Admit: 2015-04-01 | Discharge: 2015-04-01 | Disposition: A | Discharge status: Home / Self Care | Payer: No Typology Code available for payment source | Source: Ambulatory Visit | Attending: Internal Medicine | Admitting: Internal Medicine

## 2015-04-01 VITALS — BP 180/114 | HR 61 | Temp 98.1°F | Resp 18 | Wt 176.0 lb

## 2015-04-01 DIAGNOSIS — R7303 Prediabetes: Secondary | ICD-10-CM

## 2015-04-01 DIAGNOSIS — I10 Essential (primary) hypertension: Secondary | ICD-10-CM | POA: Diagnosis not present

## 2015-04-01 DIAGNOSIS — E785 Hyperlipidemia, unspecified: Secondary | ICD-10-CM | POA: Diagnosis not present

## 2015-04-01 DIAGNOSIS — F17200 Nicotine dependence, unspecified, uncomplicated: Secondary | ICD-10-CM

## 2015-04-01 DIAGNOSIS — R911 Solitary pulmonary nodule: Secondary | ICD-10-CM

## 2015-04-01 DIAGNOSIS — Z720 Tobacco use: Secondary | ICD-10-CM

## 2015-04-01 DIAGNOSIS — R7309 Other abnormal glucose: Secondary | ICD-10-CM | POA: Diagnosis not present

## 2015-04-01 LAB — HEPATIC FUNCTION PANEL
ALK PHOS: 48 U/L (ref 39–117)
ALT: 12 U/L (ref 0–53)
AST: 14 U/L (ref 0–37)
Albumin: 3.9 g/dL (ref 3.5–5.2)
BILIRUBIN TOTAL: 0.5 mg/dL (ref 0.2–1.2)
Bilirubin, Direct: 0.1 mg/dL (ref 0.0–0.3)
Total Protein: 6.7 g/dL (ref 6.0–8.3)

## 2015-04-01 LAB — BASIC METABOLIC PANEL
BUN: 12 mg/dL (ref 6–23)
CHLORIDE: 102 meq/L (ref 96–112)
CO2: 31 mEq/L (ref 19–32)
Calcium: 9.3 mg/dL (ref 8.4–10.5)
Creatinine, Ser: 1.02 mg/dL (ref 0.40–1.50)
GFR: 78.85 mL/min (ref >60.00)
Glucose, Bld: 127 mg/dL — ABNORMAL HIGH (ref 70–99)
POTASSIUM: 4.4 meq/L (ref 3.5–5.1)
Sodium: 137 mEq/L (ref 135–145)

## 2015-04-01 LAB — LIPID PANEL
Cholesterol: 180 mg/dL (ref 0–200)
HDL: 41.6 mg/dL (ref >39.00)
LDL Cholesterol: 124 mg/dL — ABNORMAL HIGH (ref 0–99)
NONHDL: 138.28
Total CHOL/HDL Ratio: 4
Triglycerides: 73 mg/dL (ref 0.0–149.0)
VLDL: 14.6 mg/dL (ref 0.0–40.0)

## 2015-04-01 LAB — HEMOGLOBIN A1C: HEMOGLOBIN A1C: 6.1 % (ref 4.6–6.5)

## 2015-04-01 LAB — TSH: TSH: 1.05 u[IU]/mL (ref 0.35–4.50)

## 2015-04-01 MED ORDER — METOPROLOL TARTRATE 50 MG PO TABS: 50.0000 mg | ORAL_TABLET | Freq: Two times a day (BID) | ORAL | Status: DC

## 2015-04-01 MED ORDER — CLONIDINE HCL 0.2 MG PO TABS: 0.2000 mg | ORAL_TABLET | Freq: Two times a day (BID) | ORAL | Status: DC

## 2015-04-01 MED ORDER — HYDRALAZINE HCL 25 MG PO TABS: 25.0000 mg | ORAL_TABLET | Freq: Three times a day (TID) | ORAL | Status: DC

## 2015-04-01 NOTE — Patient Instructions (Addendum)
Minimal Blood Pressure Goal= AVERAGE < 140/90;  Ideal is an AVERAGE < 135/85. This AVERAGE should be calculated from @ least 5-7 BP readings taken @ different times of day on different days of week. You should not respond to isolated BP readings , but rather the AVERAGE for that week .Please bring your  blood pressure cuff to office visits to verify that it is reliable.It  can also be checked against the blood pressure device at the pharmacy. Finger or wrist cuffs are not dependable; an arm cuff is.  Please think about quitting smoking. Review the risks we discussed. Please call 1-800-QUIT-NOW ([phone] 599-1421) for free smoking cessation counseling. There are multiple options are to help you stop smoking. These include nicotine patches, nicotine gum, and the new "E cigarette".     Your next office appointment will be determined based upon review of your pending labs  and  xrays  Those written interpretation of the lab results and instructions will be transmitted to you for by mail your records.  Critical results will be called.   Followup as needed for any active or acute issue. Please report any significant change in your symptoms.

## 2015-04-01 NOTE — Progress Notes (Signed)
Pre visit review using our clinic review tool, if applicable. No additional management support is needed unless otherwise documented below in the visit note. 

## 2015-04-01 NOTE — Progress Notes (Signed)
   Subjective:    Patient ID: Keith [name], male    DOB: 12/05/1955, 59 y.o.   MRN: [id number]  HPI   He is concerned as he has a DOT exam coming and is fearful that his blood pressure will not meet the requirements. He also is inquiring as to the risk factors he has for CVA or MI. Review of the chart indicates that he is due for reassessment of cardiovascular risk associated labs. Renin angiotensin studies done because of resistant hypertension were normal. 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  Blood pressure can range from 123/73-190/124. Typically it will be in the 160s over high 90s. He finds it is lowest when he is drinking a sixpack of beer or more.  He does decrease intake of fried foods and red meats. He is also restricting salt. He's cut his tobacco consumption from 2 packs a day to 1.5 packs per day.  He has been compliant with his blood pressure medicines without adverse effects.  He does have occasional cramps in his calves at night but not with ambulation.   He describes intermittent loose stools but no other active GI symptoms. Colonoscopy in October 2013 revealed a tubular adenoma.  He does have nocturia once nightly or more.    Review of Systems   Chest pain, palpitations, tachycardia, exertional dyspnea, paroxysmal nocturnal dyspnea, claudication or edema are absent. No unexplained weight loss, abdominal pain, significant dyspepsia, dysphagia, melena, rectal bleeding, or persistently small caliber stools. Dysuria, pyuria, hematuria, frequency, or polyuria are denied. Change in hair, skin, nails denied. No bowel changes of constipation or diarrhea. No intolerance to heat or cold.      Objective:   Physical Exam  Pertinent or positive findings include: Pattern alopecia is present. He has a beard. There is deformity of the right external ear. Arteriolar narrowing is noted without  hemorrhages or exudates on fundal exam. First heart sound is split. He has an isolated low-grade wheeze. No renal artery bruits are present. Pedal pulses are equal and essentially normal.   General appearance :adequately nourished; in no distress.  Eyes: No conjunctival inflammation or scleral icterus is present.  Oral exam:  Lips and gums are healthy appearing.  Heart:  Normal rate and regular rhythm. S2 normal without gallop, murmur, click, rub or other extra sounds    Lungs:No increased work of breathing.   Abdomen: bowel sounds normal, soft and non-tender without masses, organomegaly or hernias noted.  No guarding or rebound.   Vascular : all pulses equal ; no bruits present.  Skin:Warm & dry.  Intact without suspicious lesions or rashes ; no tenting or jaundice   Lymphatic: No lymphadenopathy is noted about the head, neck, axilla.   Neuro: Strength, tone & DTRs normal.         Assessment & Plan:  #1 uncontrolled hypertension  #2 smoker; risk discussed  #3 prediabetes; last A1c on record was 6.4%.  #4 dyslipidemia  See orders and recommendations

## 2015-04-10 DIAGNOSIS — E860 Dehydration: Secondary | ICD-10-CM

## 2015-04-10 HISTORY — DX: Dehydration: E86.0

## 2015-04-15 ENCOUNTER — Telehealth: Payer: Self-pay | Admitting: Internal Medicine

## 2015-04-15 NOTE — Telephone Encounter (Signed)
Tramadol andTylenol 3 should not be taken within 6 hours of each other. 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  Increase the hydralazine to 50 mg every 8 hours

## 2015-04-15 NOTE — Telephone Encounter (Signed)
Please advise 

## 2015-04-15 NOTE — Telephone Encounter (Signed)
Instructed pt on Dr [employer] notes. 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 He is waiting to hear back from Neuro to schedule back surgery.

## 2015-04-15 NOTE — Telephone Encounter (Signed)
Wanting to let you know his blood pressure is averaging 166/98, but mostly reading about 170/100. Wondering if maybe if medication should be adjusted Also, can he take tylenol 3 along with his tramadol? Please advise pt

## 2015-04-17 ENCOUNTER — Other Ambulatory Visit: Payer: Self-pay | Admitting: Neurological Surgery

## 2015-04-21 ENCOUNTER — Inpatient Hospital Stay: Admission: RE | Admit: 2015-04-21 | Payer: No Typology Code available for payment source | Source: Ambulatory Visit

## 2015-04-23 ENCOUNTER — Inpatient Hospital Stay (HOSPITAL_COMMUNITY)
Admission: EM | Admit: 2015-04-23 | Discharge: 2015-04-28 | DRG: 682 | Disposition: A | Discharge status: Home Health | Payer: No Typology Code available for payment source | Attending: Internal Medicine | Admitting: Internal Medicine

## 2015-04-23 ENCOUNTER — Encounter (HOSPITAL_COMMUNITY): Payer: Self-pay | Admitting: Vascular Surgery

## 2015-04-23 ENCOUNTER — Encounter (HOSPITAL_COMMUNITY)
Admission: RE | Admit: 2015-04-23 | Discharge: 2015-04-23 | Disposition: A | Discharge status: Home / Self Care | Payer: No Typology Code available for payment source | Source: Ambulatory Visit | Attending: Neurological Surgery | Admitting: Neurological Surgery

## 2015-04-23 ENCOUNTER — Encounter (HOSPITAL_COMMUNITY): Payer: Self-pay | Admitting: Emergency Medicine

## 2015-04-23 ENCOUNTER — Encounter (HOSPITAL_COMMUNITY): Payer: Self-pay

## 2015-04-23 DIAGNOSIS — Z0183 Encounter for blood typing: Secondary | ICD-10-CM | POA: Insufficient documentation

## 2015-04-23 DIAGNOSIS — R7309 Other abnormal glucose: Secondary | ICD-10-CM

## 2015-04-23 DIAGNOSIS — Z01818 Encounter for other preprocedural examination: Secondary | ICD-10-CM

## 2015-04-23 DIAGNOSIS — E860 Dehydration: Secondary | ICD-10-CM | POA: Diagnosis not present

## 2015-04-23 DIAGNOSIS — M5416 Radiculopathy, lumbar region: Secondary | ICD-10-CM | POA: Diagnosis present

## 2015-04-23 DIAGNOSIS — N401 Enlarged prostate with lower urinary tract symptoms: Secondary | ICD-10-CM | POA: Diagnosis present

## 2015-04-23 DIAGNOSIS — Z87891 Personal history of nicotine dependence: Secondary | ICD-10-CM

## 2015-04-23 DIAGNOSIS — Z6823 Body mass index (BMI) 23.0-23.9, adult: Secondary | ICD-10-CM

## 2015-04-23 DIAGNOSIS — E43 Unspecified severe protein-calorie malnutrition: Secondary | ICD-10-CM | POA: Diagnosis present

## 2015-04-23 DIAGNOSIS — Z79899 Other long term (current) drug therapy: Secondary | ICD-10-CM

## 2015-04-23 DIAGNOSIS — N179 Acute kidney failure, unspecified: Secondary | ICD-10-CM | POA: Diagnosis not present

## 2015-04-23 DIAGNOSIS — Z01812 Encounter for preprocedural laboratory examination: Secondary | ICD-10-CM

## 2015-04-23 DIAGNOSIS — R9431 Abnormal electrocardiogram [ECG] [EKG]: Secondary | ICD-10-CM | POA: Insufficient documentation

## 2015-04-23 DIAGNOSIS — R7989 Other specified abnormal findings of blood chemistry: Secondary | ICD-10-CM | POA: Diagnosis present

## 2015-04-23 DIAGNOSIS — Z888 Allergy status to other drugs, medicaments and biological substances status: Secondary | ICD-10-CM

## 2015-04-23 DIAGNOSIS — R7401 Elevation of levels of liver transaminase levels: Secondary | ICD-10-CM

## 2015-04-23 DIAGNOSIS — R945 Abnormal results of liver function studies: Secondary | ICD-10-CM

## 2015-04-23 DIAGNOSIS — N189 Chronic kidney disease, unspecified: Secondary | ICD-10-CM

## 2015-04-23 DIAGNOSIS — E1129 Type 2 diabetes mellitus with other diabetic kidney complication: Secondary | ICD-10-CM | POA: Diagnosis present

## 2015-04-23 DIAGNOSIS — F1010 Alcohol abuse, uncomplicated: Secondary | ICD-10-CM | POA: Diagnosis not present

## 2015-04-23 DIAGNOSIS — E785 Hyperlipidemia, unspecified: Secondary | ICD-10-CM | POA: Diagnosis present

## 2015-04-23 DIAGNOSIS — I10 Essential (primary) hypertension: Secondary | ICD-10-CM | POA: Insufficient documentation

## 2015-04-23 DIAGNOSIS — R740 Nonspecific elevation of levels of transaminase and lactic acid dehydrogenase [LDH]: Secondary | ICD-10-CM | POA: Diagnosis not present

## 2015-04-23 DIAGNOSIS — Z880 Allergy status to penicillin: Secondary | ICD-10-CM

## 2015-04-23 DIAGNOSIS — R338 Other retention of urine: Secondary | ICD-10-CM | POA: Diagnosis present

## 2015-04-23 HISTORY — DX: Type 2 diabetes mellitus without complications: E11.9

## 2015-04-23 HISTORY — DX: Chronic kidney disease, unspecified: N18.9

## 2015-04-23 HISTORY — DX: Tobacco use: Z72.0

## 2015-04-23 LAB — COMPREHENSIVE METABOLIC PANEL
ALBUMIN: 3.4 g/dL — AB (ref 3.5–5.0)
ALK PHOS: 442 U/L — AB (ref 38–126)
ALT: 208 U/L — AB (ref 17–63)
ALT: 208 U/L — AB (ref 17–63)
AST: 133 U/L — AB (ref 15–41)
AST: 108 U/L — AB (ref 15–41)
Albumin: 3.5 g/dL (ref 3.5–5.0)
Alkaline Phosphatase: 404 U/L — ABNORMAL HIGH (ref 38–126)
Anion gap: 15 (ref 5–15)
Anion gap: 14 (ref 5–15)
BILIRUBIN TOTAL: 1.2 mg/dL (ref 0.3–1.2)
BUN: 49 mg/dL — ABNORMAL HIGH (ref 6–20)
BUN: 49 mg/dL — AB (ref 6–20)
CALCIUM: 10 mg/dL (ref 8.9–10.3)
CHLORIDE: 92 mmol/L — AB (ref 101–111)
CO2: 23 mmol/L (ref 22–32)
CO2: 29 mmol/L (ref 22–32)
CREATININE: 3.72 mg/dL — AB (ref 0.61–1.24)
CREATININE: 4.19 mg/dL — AB (ref 0.61–1.24)
Calcium: 9.8 mg/dL (ref 8.9–10.3)
Chloride: 94 mmol/L — ABNORMAL LOW (ref 101–111)
GFR calc Af Amer: 16 mL/min — ABNORMAL LOW (ref >60)
GFR, EST AFRICAN AMERICAN: 19 mL/min — AB (ref >60)
GFR, EST NON AFRICAN AMERICAN: 16 mL/min — AB (ref >60)
GFR, EST NON AFRICAN AMERICAN: 14 mL/min — AB (ref >60)
Glucose, Bld: 187 mg/dL — ABNORMAL HIGH (ref 65–99)
Glucose, Bld: 173 mg/dL — ABNORMAL HIGH (ref 65–99)
POTASSIUM: 3.8 mmol/L (ref 3.5–5.1)
Potassium: 4.2 mmol/L (ref 3.5–5.1)
SODIUM: 135 mmol/L (ref 135–145)
Sodium: 132 mmol/L — ABNORMAL LOW (ref 135–145)
Total Bilirubin: 1.1 mg/dL (ref 0.3–1.2)
Total Protein: 7.3 g/dL (ref 6.5–8.1)
Total Protein: 7.1 g/dL (ref 6.5–8.1)

## 2015-04-23 LAB — URINALYSIS, ROUTINE W REFLEX MICROSCOPIC
GLUCOSE, UA: NEGATIVE mg/dL
HGB URINE DIPSTICK: NEGATIVE
Ketones, ur: NEGATIVE mg/dL
Nitrite: NEGATIVE
PROTEIN: 100 mg/dL — AB
SPECIFIC GRAVITY, URINE: 1.015 (ref 1.005–1.030)
Urobilinogen, UA: 1 mg/dL (ref 0.0–1.0)
pH: 5 (ref 5.0–8.0)

## 2015-04-23 LAB — SURGICAL PCR SCREEN
MRSA, PCR: NEGATIVE
Staphylococcus aureus: NEGATIVE

## 2015-04-23 LAB — CBC WITH DIFFERENTIAL/PLATELET
BASOS ABS: 0 10*3/uL (ref 0.0–0.1)
BASOS ABS: 0 10*3/uL (ref 0.0–0.1)
BASOS PCT: 0 %
Basophils Relative: 0 %
EOS ABS: 0.9 10*3/uL — AB (ref 0.0–0.7)
Eosinophils Absolute: 0.8 10*3/uL — ABNORMAL HIGH (ref 0.0–0.7)
Eosinophils Relative: 5 %
Eosinophils Relative: 6 %
HCT: 52.1 % — ABNORMAL HIGH (ref 39.0–52.0)
HCT: 49.2 % (ref 39.0–52.0)
Hemoglobin: 18.4 g/dL — ABNORMAL HIGH (ref 13.0–17.0)
Hemoglobin: 17.5 g/dL — ABNORMAL HIGH (ref 13.0–17.0)
LYMPHS PCT: 17 %
Lymphocytes Relative: 14 %
Lymphs Abs: 2.2 10*3/uL (ref 0.7–4.0)
Lymphs Abs: 2.4 10*3/uL (ref 0.7–4.0)
MCH: 33.7 pg (ref 26.0–34.0)
MCH: 33.5 pg (ref 26.0–34.0)
MCHC: 35.3 g/dL (ref 30.0–36.0)
MCHC: 35.6 g/dL (ref 30.0–36.0)
MCV: 95.4 fL (ref 78.0–100.0)
MCV: 94.3 fL (ref 78.0–100.0)
MONO ABS: 1.6 10*3/uL — AB (ref 0.1–1.0)
MONO ABS: 1.4 10*3/uL — AB (ref 0.1–1.0)
Monocytes Relative: 10 %
Monocytes Relative: 10 %
NEUTROS PCT: 67 %
Neutro Abs: 11.1 10*3/uL — ABNORMAL HIGH (ref 1.7–7.7)
Neutro Abs: 9.5 10*3/uL — ABNORMAL HIGH (ref 1.7–7.7)
Neutrophils Relative %: 71 %
PLATELETS: 149 10*3/uL — AB (ref 150–400)
PLATELETS: 160 10*3/uL (ref 150–400)
RBC: 5.46 MIL/uL (ref 4.22–5.81)
RBC: 5.22 MIL/uL (ref 4.22–5.81)
RDW: 13.3 % (ref 11.5–15.5)
RDW: 13.2 % (ref 11.5–15.5)
WBC MORPHOLOGY: INCREASED
WBC: 15.7 10*3/uL — AB (ref 4.0–10.5)
WBC: 14.2 10*3/uL — AB (ref 4.0–10.5)

## 2015-04-23 LAB — PROTIME-INR
INR: 1.1 (ref 0.00–1.49)
PROTHROMBIN TIME: 14.4 s (ref 11.6–15.2)

## 2015-04-23 LAB — URINE MICROSCOPIC-ADD ON

## 2015-04-23 LAB — ABO/RH: ABO/RH(D): A POS

## 2015-04-23 LAB — GLUCOSE, CAPILLARY: GLUCOSE-CAPILLARY: 175 mg/dL — AB (ref 65–99)

## 2015-04-23 LAB — TYPE AND SCREEN
ABO/RH(D): A POS
ANTIBODY SCREEN: NEGATIVE

## 2015-04-23 MED ORDER — SODIUM CHLORIDE 0.9 % IV BOLUS (SEPSIS)
1000.0000 mL | Freq: Once | INTRAVENOUS | Status: AC
  Administered 2015-04-23: 1000 mL via INTRAVENOUS

## 2015-04-23 MED ORDER — DIPHENHYDRAMINE HCL 50 MG/ML IJ SOLN
25.0000 mg | Freq: Once | INTRAMUSCULAR | Status: AC
  Administered 2015-04-23: 25 mg via INTRAVENOUS
  Filled 2015-04-23: qty 1

## 2015-04-23 NOTE — Progress Notes (Signed)
Patient denies ever having had an Echo, Stress, or Cardiac Cath or seeing a cardiologist.    Patient's PCP is [name], MD

## 2015-04-23 NOTE — ED Notes (Signed)
Pt has taken his oxycodone at 1130 am--

## 2015-04-23 NOTE — ED Notes (Signed)
Pt was told to come here due to abnormal labs, had pre op work up for surgery scheduled on Friday at 3pm by dr. Ronnald Ramp-- for spine surgery. WBC -- 15.7

## 2015-04-23 NOTE — H&P (Signed)
PCP:  [name], MD  Neurosurgery Ronnald Ramp  Referring Shady Bradish Plunkett   Chief Complaint:  Abnormal labs HPI: Keith [name] is a 59 y.o. male   has a past medical history of Hypertension; Colon polyp; Diverticulosis; Tobacco abuse; and Diabetes mellitus without complication.   Presented with  Patient has history of poorly controlled hypertension as well as radicular Back pain for which she was undergoing preop clearance. The operation was scheduled for 04/25/2015 Patient had his labs checked and was found to have acute onset of renal failure with creatinine up to 4.19 with baseline creatinine being 1.0 in August 2016. Patient appears to be dehydrated and hemoconcentrated with hemoglobin of 17 potassium 3.8 She also was found to have elevated LFTs with AST 108 ALT 208 and elevated alkaline phosphatase is 404. He denies any right upper quadrant pain no nausea no vomiting no diarrhea. Of note patient has history of alcohol abuse usually drinks 6 packs per day. For the past 5 days he stopped drinking in preparation for his back surgery. He denies any symptoms of withdrawal. She reports taking good by mouth. Patient has history of BPH but reports being able to empty properly. He did notice and lately his urine has turned darker and he had had decrease in urine output.  His been taking Percocet for his back pain. Of note home medications includes losartan hydrochlorothiazide. Patient reports significant back pain he is currently using percocet but have not had any since AM.  Deneis any neurological complaints. denies fever or chills. Reports he has lost 6 LB over last 3 months. Reports poor appettite  Hospitalist was called for admission for acute renal failure and LFT elevations  Review of Systems:    Pertinent positives include: Back pain, dark urine fatigue, weight loss   Constitutional:  No weight loss, night sweats, Fevers, chills,  HEENT:  No headaches, Difficulty  swallowing,Tooth/dental problems,Sore throat,  No sneezing, itching, ear ache, nasal congestion, post nasal drip,  Cardio-vascular:  No chest pain, Orthopnea, PND, anasarca, dizziness, palpitations.no Bilateral lower extremity swelling  GI:  No heartburn, indigestion, abdominal pain, nausea, vomiting, diarrhea, change in bowel habits, loss of appetite, melena, blood in stool, hematemesis Resp:  no shortness of breath at rest. No dyspnea on exertion, No excess mucus, no productive cough, No non-productive cough, No coughing up of blood.No change in color of mucus.No wheezing. Skin:  no rash or lesions. No jaundice GU:  no dysuria, change in color of urine, no urgency or frequency. No straining to urinate.  No flank pain.  Musculoskeletal:  No joint pain or no joint swelling. No decreased range of motion.   Psych:  No change in mood or affect. No depression or anxiety. No memory loss.  Neuro: no localizing neurological complaints, no tingling, no weakness, no double vision, no gait abnormality, no slurred speech, no confusion  Otherwise ROS are negative except for above, 10 systems were reviewed  Past Medical History: Past Medical History  Diagnosis Date  . Hypertension   . Colon polyp   . Diverticulosis   . Tobacco abuse   . Diabetes mellitus without complication     borderline; not treated   Past Surgical History  Procedure Laterality Date  . Wisdom tooth extraction    . 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 Colonoscopy with polypectomy      polyp X 1     Medications: Prior to Admission medications  Medication Sig Start Date End Date Taking? Authorizing Caydence Enck  cloNIDine (CATAPRES) 0.2 MG tablet Take 1 tablet (0.2 mg total) by mouth 2 (two) times daily. 04/01/15  Yes Hendricks Limes, MD  hydrALAZINE (APRESOLINE) 25 MG tablet Take 1 tablet (25 mg total) by mouth 3 (three) times daily. Patient taking differently: Take 50 mg by mouth 3 (three)  times daily.  04/01/15  Yes Hendricks Limes, MD  losartan-hydrochlorothiazide (HYZAAR) 100-12.5 MG per tablet Take 1 tablet by mouth daily. 12/27/14  Yes Hendricks Limes, MD  metoprolol (LOPRESSOR) 50 MG tablet Take 1 tablet (50 mg total) by mouth 2 (two) times daily. 04/01/15  Yes Hendricks Limes, MD  oxyCODONE-acetaminophen (PERCOCET/ROXICET) 5-325 MG per tablet Take 1 tablet by mouth every 4 (four) hours as needed for moderate pain.    Yes Historical Barney Gertsch, MD    Allergies:   Allergies  Allergen Reactions  . Bupropion Hcl     REACTION: rash @ pressure areas (waist, groin , axillae)  . Penicillins     ? Rash , hives  . Amoxicillin Itching and Rash    Social History:  Ambulatory   independently   Lives at home  With family     reports that he quit smoking 4 days ago. His smoking use included Cigarettes. He has a 80 pack-year smoking history. He has never used smokeless tobacco. He reports that he drinks about 14.4 oz of alcohol per week. He reports that he does not use illicit drugs.    Family History: family history includes Alcohol abuse in his father; Breast cancer in his sister; Diabetes in his brother and mother; Melanoma in his father; Stomach cancer in his father; Stroke in his father. There is no history of Colon cancer, Esophageal cancer, or Rectal cancer.    Physical Exam: Patient Vitals for the past 24 hrs:  BP Temp Temp src Pulse Resp SpO2 Height Weight  04/23/15 2241 153/95 mmHg 98.1 F (36.7 C) Oral 87 16 98 % - -  04/23/15 2215 159/93 mmHg - - 72 - 95 % - -  04/23/15 2200 156/95 mmHg - - 73 - 95 % - -  04/23/15 2145 153/87 mmHg - - 71 - 97 % - -  04/23/15 2130 170/95 mmHg - - 83 - 99 % - -  04/23/15 1832 126/86 mmHg 98.1 F (36.7 C) Oral 96 16 97 % 6' (1.829 m) 72.848 kg (160 lb 9.6 oz)    1. General:  in No Acute distress 2. Psychological: Alert and   Oriented 3. Head/ENT:     Dry Mucous Membranes                          Head Non traumatic, neck  supple                            Poor Dentition 4. SKIN:   decreased Skin turgor,  Skin clean Dry and intact no rash 5. Heart: Regular rate and rhythm no Murmur, Rub or gallop 6. Lungs:  no wheezes, mild crackles   7. Abdomen: Soft, non-tender, Non distended 8. Lower extremities: no clubbing, cyanosis, or edema 9. Neurologically Grossly intact, moving all 4 extremities equally 10. MSK: Normal range of motion  body mass index is 21.78 kg/(m^2).   Labs on Admission:   Results for orders placed or performed during the hospital encounter of 04/23/15 (from the past  24 hour(s))  Comprehensive metabolic panel     Status: Abnormal   Collection Time: 04/23/15 10:16 PM  Result Value Ref Range   Sodium 135 135 - 145 mmol/L   Potassium 3.8 3.5 - 5.1 mmol/L   Chloride 92 (L) 101 - 111 mmol/L   CO2 29 22 - 32 mmol/L   Glucose, Bld 173 (H) 65 - 99 mg/dL   BUN 49 (H) 6 - 20 mg/dL   Creatinine, Ser 4.19 (H) 0.61 - 1.24 mg/dL   Calcium 9.8 8.9 - 10.3 mg/dL   Total Protein 7.1 6.5 - 8.1 g/dL   Albumin 3.4 (L) 3.5 - 5.0 g/dL   AST 108 (H) 15 - 41 U/L   ALT 208 (H) 17 - 63 U/L   Alkaline Phosphatase 404 (H) 38 - 126 U/L   Total Bilirubin 1.1 0.3 - 1.2 mg/dL   GFR calc non Af Amer 14 (L) >60 mL/min   GFR calc Af Amer 16 (L) >60 mL/min   Anion gap 14 5 - 15  CBC with Differential/Platelet     Status: Abnormal (Preliminary result)   Collection Time: 04/23/15 10:17 PM  Result Value Ref Range   WBC 14.2 (H) 4.0 - 10.5 K/uL   RBC 5.22 4.22 - 5.81 MIL/uL   Hemoglobin 17.5 (H) 13.0 - 17.0 g/dL   HCT 49.2 39.0 - 52.0 %   MCV 94.3 78.0 - 100.0 fL   MCH 33.5 26.0 - 34.0 pg   MCHC 35.6 30.0 - 36.0 g/dL   RDW 13.2 11.5 - 15.5 %   Platelets 160 150 - 400 K/uL   Neutrophils Relative % PENDING %   Neutro Abs PENDING 1.7 - 7.7 K/uL   Band Neutrophils PENDING %   Lymphocytes Relative PENDING %   Lymphs Abs PENDING 0.7 - 4.0 K/uL   Monocytes Relative PENDING %   Monocytes Absolute PENDING 0.1 - 1.0  K/uL   Eosinophils Relative PENDING %   Eosinophils Absolute PENDING 0.0 - 0.7 K/uL   Basophils Relative PENDING %   Basophils Absolute PENDING 0.0 - 0.1 K/uL   WBC Morphology PENDING    RBC Morphology PENDING    Smear Review PENDING    nRBC PENDING 0 /100 WBC   Metamyelocytes Relative PENDING %   Myelocytes PENDING %   Promyelocytes Absolute PENDING %   Blasts PENDING %    UA 7-10 white blood cells few bacteria and no nitrites indeterminate possibly contaminant await results of urine culture  Lab Results  Component Value Date   HGBA1C 6.1 04/01/2015    Estimated Creatinine Clearance: 19.1 mL/min (by C-G formula based on Cr of 4.19).  BNP (last 3 results) No results for input(s): PROBNP in the last 8760 hours.  Other results:  I have pearsonaly reviewed this: ECG REPORT  Rate: 88  Rhythm: Normal sinus rhythm ST&T Change: No ischemic changes QTC 430  Filed Weights   04/23/15 1832  Weight: 72.848 kg (160 lb 9.6 oz)     Cultures: No results found for: SDES, SPECREQUEST, CULT, REPTSTATUS   Radiological Exams on Admission: No results found.  Chart has been reviewed  Family  at  Bedside  plan of care was discussed with  Wife Keith [name] [phone]  Assessment/Plan  59 year old gentleman with history of poorly controlled hypertension, alcohol abuse, diabetes and radicular back pain presents with acute onset of renal failure evidence of dehydration and LFT elevations  Present on Admission:  . Essential hypertension hold ACE inhibitor and hydrochlorothiazide  .  Hyperlipidemia - chronic will need to avoid statins due to LFT elevation  . Lumbar radiculopathy pain control avoid acetaminophen giving new onset of transaminitis  . Acute renal failure - - likely secondary to dehydration, check FeNA and if not improved with IVF would obtain renal US and consider renal consult. Will hold ACE inhibitor  . Elevated LFTs - will need to obtain hepatitis panel ultrasound  of the right upper quadrant continued to trend LFTs. Will order CK, will obtain ultrasound of abdomen to evaluate for any liver abnormality. Given history of extensive tobacco abuse and weight loss will check chest x-ray . Alcohol abuse - currently out of the window for withdrawal. We'll continue to monitor for new symptoms. Encourage sobriety  . DM (diabetes mellitus) type II controlled with renal manifestation Ratio and elevated white blood cell count with few bacteria. Patient is a symptomatic will await results of urine culture  Prophylaxis: hep [place]  CODE STATUS:  FULL CODE as per patient    Disposition:   To home once workup is complete and patient is stable  Other plan as per orders.  I have spent a total of 65 min on this admission was taken to discuss case with pharmacy  [facility] 04/23/2015, 11:26 PM  [facility]  Pager [phone]   after 2 AM please page floor coverage PA If 7AM-7PM, please contact the day team taking care of the patient  [email or web address]  Password [email or web address]

## 2015-04-23 NOTE — Pre-Procedure Instructions (Signed)
    Keith [name]  04/23/2015      [id number] - [place] ([place]), [place] [address] [place] ([place]) [place] [address] Phone: [phone] Fax: [phone]    Your procedure is scheduled on Friday April 25 2015 at 230 p.m.  Report to [facility] Admitting at 1230 P.M.  Call this number if you have problems the morning of surgery:  [phone]   Remember:  Do not eat food or drink liquids after midnight Thursday September 15th.  Take these medicines the morning of surgery with A SIP OF WATER Metoprolol (Lopressor), Clonidine (Catapres), Hydralazine (Apresoline). If needed: Percocet (Pain Pill)  STOP: ALL Vitamins, Supplements, Effient and Herbal Medications, Fish Oils, Aspirins, NSAIDs (Nonsteroidal Anti-inflammatories such as Ibuprofen, Aleve, or Advil), and Goody's/BC Powders 7 days prior to surgery, until after surgery as directed by your physician.     Do not wear jewelry.  Do not wear lotions, powders, or perfumes.  You may wear deodorant.  Do not shave 48 hours prior to surgery.  Men may shave face and neck.  Do not bring valuables to the hospital.  [facility] is not responsible for any belongings or valuables.  Contacts, dentures or bridgework may not be worn into surgery.  Leave your suitcase in the car.  After surgery it may be brought to your room.  For patients admitted to the hospital, discharge time will be determined by your treatment team.  Patients discharged the day of surgery will not be allowed to drive home.   Special instructions:  Please follow these instructions carefully:  1. Shower with CHG Soap the night before surgery and the morning of Surgery. 2. If you choose to wash your hair, wash your hair first as usual with your normal shampoo. 3. After you shampoo, rinse your hair and body thoroughly to remove the Shampoo. 4. Use CHG as you would any other  liquid soap. You can apply chg directly to the skin and wash gently with scrungie or a clean washcloth. 5. Apply the CHG Soap to your body ONLY FROM THE NECK DOWN. Do not use on open wounds or open sores. Avoid contact with your eyes, ears, mouth and genitals (private parts). Wash genitals (private parts) with your normal soap. 6. Wash thoroughly, paying special attention to the area where your surgery will be performed. 7. Thoroughly rinse your body with warm water from the neck down. 8. DO NOT shower/wash with your normal soap after using and rinsing off the CHG Soap. 9. Pat yourself dry with a clean towel.  10. Wear clean pajamas.  11. Place clean sheets on your bed the night of your first shower and do not sleep with pets.  Day of Surgery  Do not apply any lotions/deodorants the morning of surgery. Please wear clean clothes to the hospital/surgery center.    Please read over the following fact sheets that you were given. Pain Booklet, Coughing and Deep Breathing, Blood Transfusion Information, MRSA Information and Surgical Site Infection Prevention

## 2015-04-23 NOTE — ED Provider Notes (Addendum)
CSN: [id number]     Arrival date & time 04/23/15  1758 History   First MD Initiated Contact with Patient 04/23/15 2117     Chief Complaint  Patient presents with  . abnormal labs      (Consider location/radiation/quality/duration/timing/severity/associated sxs/prior Treatment) HPI Comments: Patient is a 59 year old male with a history of hypertension, functional alcoholism, tobacco abuse and borderline diabetes. He comes today because of abnormal labs. Patient has ongoing worsening lumbar radiculopathy which was scheduled for surgery on Friday. Today he was just getting preop labs. He was called and told to come here due to abnormal liver function, creatinine and elevated white blood cell count. Patient states for the last few weeks he has been in severe pain and has not felt like eating. He last ran 5 days ago. He denies any nausea or vomiting but has not had an appetite. He is still drinking water but has noticed in the last 3 days his urine has become dark. He denies any fever, chest pain, shortness of breath, swelling or weight gain. Takes 4 medications for his blood pressure most recent medication he started with hydralazine. He does take losartan, metoprolol and clonidine. Most recent blood pressure medication addition was 1-2 months ago.  The history is provided by the patient.    Past Medical History  Diagnosis Date  . Hypertension   . Colon polyp   . Diverticulosis   . Tobacco abuse   . Diabetes mellitus without complication     borderline; not treated   Past Surgical History  Procedure Laterality Date  . Wisdom tooth extraction    . 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 Colonoscopy with polypectomy      polyp X 1   Family History  Problem Relation Age of Onset  . Stroke Father   . Melanoma Father   . Stomach cancer Father   . Alcohol abuse Father   . Diabetes Mother   . Breast cancer Sister   . Diabetes Brother   . Colon cancer Neg Hx   .  Esophageal cancer Neg Hx   . Rectal cancer Neg Hx    Social History  Substance Use Topics  . Smoking status: Former Smoker -- 2.00 packs/day for 40 years    Types: Cigarettes    Quit date: 04/19/2015  . Smokeless tobacco: Never Used  . Alcohol Use: 14.4 oz/week    24 Cans of beer per week     Comment: case/week    Review of Systems  All other systems reviewed and are negative.     Allergies  Bupropion hcl; Penicillins; and Amoxicillin  Home Medications   Prior to Admission medications   Medication Sig Start Date End Date Taking? Authorizing Provider  cloNIDine (CATAPRES) 0.2 MG tablet Take 1 tablet (0.2 mg total) by mouth 2 (two) times daily. 04/01/15  Yes Hendricks Limes, MD  hydrALAZINE (APRESOLINE) 25 MG tablet Take 1 tablet (25 mg total) by mouth 3 (three) times daily. Patient taking differently: Take 50 mg by mouth 3 (three) times daily.  04/01/15  Yes Hendricks Limes, MD  losartan-hydrochlorothiazide (HYZAAR) 100-12.5 MG per tablet Take 1 tablet by mouth daily. 12/27/14  Yes Hendricks Limes, MD  metoprolol (LOPRESSOR) 50 MG tablet Take 1 tablet (50 mg total) by mouth 2 (two) times daily. 04/01/15  Yes Hendricks Limes, MD  oxyCODONE-acetaminophen (PERCOCET/ROXICET) 5-325 MG per tablet Take 1 tablet by mouth every  4 (four) hours as needed for moderate pain.    Yes Historical Provider, MD   BP 159/93 mmHg  Pulse 72  Temp(Src) 98.1 F (36.7 C) (Oral)  Resp 16  Ht 6' (1.829 m)  Wt 160 lb 9.6 oz (72.848 kg)  BMI 21.78 kg/m2  SpO2 95% Physical Exam  Constitutional: He is oriented to person, place, and time. He appears well-developed and well-nourished. No distress.  HENT:  Head: Normocephalic and atraumatic.  Mouth/Throat: Oropharynx is clear and moist.  Eyes: Conjunctivae and EOM are normal. Pupils are equal, round, and reactive to light.  Neck: Normal range of motion. Neck supple.  Cardiovascular: Normal rate, regular rhythm and intact distal pulses.   No murmur  heard. Pulmonary/Chest: Effort normal and breath sounds normal. No respiratory distress. He has no wheezes. He has no rales.  Abdominal: Soft. He exhibits no distension. There is no tenderness. There is no rebound and no guarding.  Musculoskeletal: Normal range of motion. He exhibits no edema or tenderness.  Neurological: He is alert and oriented to person, place, and time.  Skin: Skin is warm and dry. Rash noted. No erythema.  Hives present on the left upper extremity  Psychiatric: He has a normal mood and affect. His behavior is normal.  Nursing note and vitals reviewed.   ED Course  Procedures (including critical care time) Labs Review Labs Reviewed  CBC WITH DIFFERENTIAL/PLATELET - Abnormal; Notable for the following:    WBC 14.2 (*)    Hemoglobin 17.5 (*)    All other components within normal limits  COMPREHENSIVE METABOLIC PANEL - Abnormal; Notable for the following:    Chloride 92 (*)    Glucose, Bld 173 (*)    BUN 49 (*)    Creatinine, Ser 4.19 (*)    Albumin 3.4 (*)    AST 108 (*)    ALT 208 (*)    Alkaline Phosphatase 404 (*)    GFR calc non Af Amer 14 (*)    GFR calc Af Amer 16 (*)    All other components within normal limits  URINALYSIS, ROUTINE W REFLEX MICROSCOPIC (NOT AT [facility])    Imaging Review No results found. I have personally reviewed and evaluated these images and lab results as part of my medical decision-making.   EKG Interpretation None      MDM   Final diagnoses:  Transaminitis  Acute renal failure, unspecified acute renal failure type  Dehydration   patient presenting after being called by the office for abnormal labs. Patient was scheduled to have lumbar surgery done on Friday and he was getting preop labs today. CBC had a leukocytosis of 15,000 and CMP had new elevated liver function tests as well as a creatinine of 3.4 from a baseline of 13 weeks ago. Patient does take an ACE inhibitor but denies any over-the-counter Tylenol or Advil. He  has not been eating well recently because of all the pain in his back. He denies any alcohol use in the last 5 days but does say he still drinking water. He's never had any issues with his renal function in the past. He denies any infectious symptoms and denies any symptoms of urinary retention.  Initially we will repeat labs to ensure that they are correct.  11:11 PM Patient's labs are correct. When discussing with him he is taking 2 Percocets 3 times a day but that only 1900 mg per day. He is taking other over-the-counter. It may be all dehydration that's causing his acute  renal air, elevated LFTs however will admit for further care and evaluation.  Blanchie Dessert, MD 04/23/15 5217  Blanchie Dessert, MD 04/23/15 [phone]

## 2015-04-23 NOTE — ED Notes (Signed)
Pt admits to being "a functional alcoholic" drinking everyday if possible. Last drink was 4 days ago--discussed withdrawal with pt, denies symptoms. See CIWA

## 2015-04-23 NOTE — Progress Notes (Signed)
Called Myra Gianotti PA regarding patient's lab results.  Called patient to tell him to go to [facility] ED as soon as possible tonight to be evaluated due to kidney function.  Patient was disagreeable to this but stated he would tell his wife and come to the ED.

## 2015-04-24 ENCOUNTER — Inpatient Hospital Stay (HOSPITAL_COMMUNITY): Payer: No Typology Code available for payment source

## 2015-04-24 DIAGNOSIS — E860 Dehydration: Secondary | ICD-10-CM | POA: Diagnosis present

## 2015-04-24 DIAGNOSIS — Z87891 Personal history of nicotine dependence: Secondary | ICD-10-CM | POA: Diagnosis not present

## 2015-04-24 DIAGNOSIS — E1129 Type 2 diabetes mellitus with other diabetic kidney complication: Secondary | ICD-10-CM

## 2015-04-24 DIAGNOSIS — I10 Essential (primary) hypertension: Secondary | ICD-10-CM | POA: Diagnosis present

## 2015-04-24 DIAGNOSIS — M5416 Radiculopathy, lumbar region: Secondary | ICD-10-CM | POA: Diagnosis present

## 2015-04-24 DIAGNOSIS — N179 Acute kidney failure, unspecified: Principal | ICD-10-CM

## 2015-04-24 DIAGNOSIS — Z6823 Body mass index (BMI) 23.0-23.9, adult: Secondary | ICD-10-CM | POA: Diagnosis not present

## 2015-04-24 DIAGNOSIS — N058 Unspecified nephritic syndrome with other morphologic changes: Secondary | ICD-10-CM

## 2015-04-24 DIAGNOSIS — R338 Other retention of urine: Secondary | ICD-10-CM | POA: Diagnosis present

## 2015-04-24 DIAGNOSIS — R7401 Elevation of levels of liver transaminase levels: Secondary | ICD-10-CM | POA: Insufficient documentation

## 2015-04-24 DIAGNOSIS — Z888 Allergy status to other drugs, medicaments and biological substances status: Secondary | ICD-10-CM | POA: Diagnosis not present

## 2015-04-24 DIAGNOSIS — N401 Enlarged prostate with lower urinary tract symptoms: Secondary | ICD-10-CM | POA: Diagnosis present

## 2015-04-24 DIAGNOSIS — Z880 Allergy status to penicillin: Secondary | ICD-10-CM | POA: Diagnosis not present

## 2015-04-24 DIAGNOSIS — E785 Hyperlipidemia, unspecified: Secondary | ICD-10-CM | POA: Diagnosis present

## 2015-04-24 DIAGNOSIS — R7989 Other specified abnormal findings of blood chemistry: Secondary | ICD-10-CM | POA: Diagnosis not present

## 2015-04-24 DIAGNOSIS — F1010 Alcohol abuse, uncomplicated: Secondary | ICD-10-CM

## 2015-04-24 DIAGNOSIS — R740 Nonspecific elevation of levels of transaminase and lactic acid dehydrogenase [LDH]: Secondary | ICD-10-CM

## 2015-04-24 DIAGNOSIS — E43 Unspecified severe protein-calorie malnutrition: Secondary | ICD-10-CM | POA: Diagnosis present

## 2015-04-24 LAB — CBC
HEMATOCRIT: 45 % (ref 39.0–52.0)
HEMOGLOBIN: 15.8 g/dL (ref 13.0–17.0)
MCH: 33.2 pg (ref 26.0–34.0)
MCHC: 35.1 g/dL (ref 30.0–36.0)
MCV: 94.5 fL (ref 78.0–100.0)
Platelets: 159 10*3/uL (ref 150–400)
RBC: 4.76 MIL/uL (ref 4.22–5.81)
RDW: 13.3 % (ref 11.5–15.5)
WBC: 12.5 10*3/uL — AB (ref 4.0–10.5)

## 2015-04-24 LAB — COMPREHENSIVE METABOLIC PANEL
ALBUMIN: 3 g/dL — AB (ref 3.5–5.0)
ALK PHOS: 339 U/L — AB (ref 38–126)
ALT: 182 U/L — AB (ref 17–63)
AST: 100 U/L — AB (ref 15–41)
Anion gap: 10 (ref 5–15)
BUN: 48 mg/dL — AB (ref 6–20)
CHLORIDE: 99 mmol/L — AB (ref 101–111)
CO2: 26 mmol/L (ref 22–32)
Calcium: 8.9 mg/dL (ref 8.9–10.3)
Creatinine, Ser: 4.31 mg/dL — ABNORMAL HIGH (ref 0.61–1.24)
GFR calc Af Amer: 16 mL/min — ABNORMAL LOW (ref >60)
GFR calc non Af Amer: 14 mL/min — ABNORMAL LOW (ref >60)
GLUCOSE: 158 mg/dL — AB (ref 65–99)
Potassium: 3.8 mmol/L (ref 3.5–5.1)
SODIUM: 135 mmol/L (ref 135–145)
TOTAL PROTEIN: 6 g/dL — AB (ref 6.5–8.1)
Total Bilirubin: 0.9 mg/dL (ref 0.3–1.2)

## 2015-04-24 LAB — ACETAMINOPHEN LEVEL

## 2015-04-24 LAB — CK: CK TOTAL: 23 U/L — AB (ref 49–397)

## 2015-04-24 LAB — GLUCOSE, CAPILLARY
GLUCOSE-CAPILLARY: 150 mg/dL — AB (ref 65–99)
GLUCOSE-CAPILLARY: 164 mg/dL — AB (ref 65–99)
Glucose-Capillary: 149 mg/dL — ABNORMAL HIGH (ref 65–99)
Glucose-Capillary: 138 mg/dL — ABNORMAL HIGH (ref 65–99)
Glucose-Capillary: 132 mg/dL — ABNORMAL HIGH (ref 65–99)

## 2015-04-24 LAB — PROTIME-INR
INR: 1.06 (ref 0.00–1.49)
PROTHROMBIN TIME: 14 s (ref 11.6–15.2)

## 2015-04-24 LAB — MAGNESIUM
Magnesium: 2.5 mg/dL — ABNORMAL HIGH (ref 1.7–2.4)
Magnesium: 2.6 mg/dL — ABNORMAL HIGH (ref 1.7–2.4)

## 2015-04-24 LAB — LIPASE, BLOOD: Lipase: 53 U/L — ABNORMAL HIGH (ref 22–51)

## 2015-04-24 LAB — SODIUM, URINE, RANDOM
Sodium, Ur: 19 mmol/L
Sodium, Ur: 28 mmol/L

## 2015-04-24 LAB — CREATININE, URINE, RANDOM
CREATININE, URINE: 134.07 mg/dL
Creatinine, Urine: 99.46 mg/dL

## 2015-04-24 LAB — PHOSPHORUS
Phosphorus: 3.7 mg/dL (ref 2.5–4.6)
Phosphorus: 4.3 mg/dL (ref 2.5–4.6)

## 2015-04-24 LAB — TSH: TSH: 0.828 u[IU]/mL (ref 0.350–4.500)

## 2015-04-24 MED ORDER — FOLIC ACID 1 MG PO TABS
1.0000 mg | ORAL_TABLET | Freq: Every day | ORAL | Status: DC
  Administered 2015-04-24 – 2015-04-28 (×5): 1 mg via ORAL
  Filled 2015-04-24 (×5): qty 1

## 2015-04-24 MED ORDER — FOLIC ACID 1 MG PO TABS: 1.0000 mg | ORAL_TABLET | Freq: Every day | ORAL | Status: DC

## 2015-04-24 MED ORDER — HEPARIN SODIUM (PORCINE) 5000 UNIT/ML IJ SOLN
5000.0000 [IU] | Freq: Three times a day (TID) | INTRAMUSCULAR | Status: DC
  Administered 2015-04-24 – 2015-04-28 (×11): 5000 [IU] via SUBCUTANEOUS
  Filled 2015-04-24 (×9): qty 1

## 2015-04-24 MED ORDER — ONDANSETRON HCL 4 MG PO TABS: 4.0000 mg | ORAL_TABLET | Freq: Four times a day (QID) | ORAL | Status: DC | PRN

## 2015-04-24 MED ORDER — CLONIDINE HCL 0.2 MG PO TABS
0.2000 mg | ORAL_TABLET | Freq: Two times a day (BID) | ORAL | Status: DC
  Administered 2015-04-24 – 2015-04-28 (×9): 0.2 mg via ORAL
  Filled 2015-04-24 (×10): qty 1

## 2015-04-24 MED ORDER — DOCUSATE SODIUM 100 MG PO CAPS
100.0000 mg | ORAL_CAPSULE | Freq: Two times a day (BID) | ORAL | Status: DC
  Administered 2015-04-24 – 2015-04-28 (×9): 100 mg via ORAL
  Filled 2015-04-24 (×10): qty 1

## 2015-04-24 MED ORDER — DIPHENHYDRAMINE HCL 25 MG PO CAPS
25.0000 mg | ORAL_CAPSULE | Freq: Three times a day (TID) | ORAL | Status: DC | PRN
  Administered 2015-04-24: 25 mg via ORAL
  Filled 2015-04-24: qty 1

## 2015-04-24 MED ORDER — BISACODYL 10 MG RE SUPP: 10.0000 mg | Freq: Every day | RECTAL | Status: DC | PRN

## 2015-04-24 MED ORDER — THIAMINE HCL 100 MG/ML IJ SOLN: 100.0000 mg | Freq: Every day | INTRAMUSCULAR | Status: DC

## 2015-04-24 MED ORDER — VITAMIN B-1 100 MG PO TABS: 100.0000 mg | ORAL_TABLET | Freq: Every day | ORAL | Status: DC

## 2015-04-24 MED ORDER — SODIUM CHLORIDE 0.9 % IV SOLN
INTRAVENOUS | Status: AC
  Administered 2015-04-24: 02:00:00 via INTRAVENOUS

## 2015-04-24 MED ORDER — INSULIN ASPART 100 UNIT/ML ~~LOC~~ SOLN: 0.0000 [IU] | Freq: Every day | SUBCUTANEOUS | Status: DC

## 2015-04-24 MED ORDER — ONDANSETRON HCL 4 MG/2ML IJ SOLN: 4.0000 mg | Freq: Four times a day (QID) | INTRAMUSCULAR | Status: DC | PRN

## 2015-04-24 MED ORDER — DIPHENHYDRAMINE HCL 25 MG PO CAPS
25.0000 mg | ORAL_CAPSULE | Freq: Four times a day (QID) | ORAL | Status: DC | PRN
  Administered 2015-04-24 – 2015-04-26 (×3): 25 mg via ORAL
  Filled 2015-04-24 (×3): qty 1

## 2015-04-24 MED ORDER — INSULIN ASPART 100 UNIT/ML ~~LOC~~ SOLN
0.0000 [IU] | Freq: Three times a day (TID) | SUBCUTANEOUS | Status: DC
  Administered 2015-04-24 – 2015-04-25 (×4): 1 [IU] via SUBCUTANEOUS
  Administered 2015-04-25 – 2015-04-26 (×2): 2 [IU] via SUBCUTANEOUS
  Administered 2015-04-26: 1 [IU] via SUBCUTANEOUS
  Administered 2015-04-27: 2 [IU] via SUBCUTANEOUS
  Administered 2015-04-28: 1 [IU] via SUBCUTANEOUS
  Administered 2015-04-28: 2 [IU] via SUBCUTANEOUS

## 2015-04-24 MED ORDER — ACETAMINOPHEN 325 MG PO TABS: 650.0000 mg | ORAL_TABLET | Freq: Four times a day (QID) | ORAL | Status: DC | PRN

## 2015-04-24 MED ORDER — LORAZEPAM 1 MG PO TABS: 1.0000 mg | ORAL_TABLET | Freq: Four times a day (QID) | ORAL | Status: AC | PRN

## 2015-04-24 MED ORDER — LORAZEPAM 2 MG/ML IJ SOLN
0.0000 mg | Freq: Two times a day (BID) | INTRAMUSCULAR | Status: AC
  Administered 2015-04-26: 1 mg via INTRAVENOUS
  Filled 2015-04-24: qty 1

## 2015-04-24 MED ORDER — ACETAMINOPHEN 650 MG RE SUPP: 650.0000 mg | Freq: Four times a day (QID) | RECTAL | Status: DC | PRN

## 2015-04-24 MED ORDER — OXYCODONE HCL 5 MG PO TABS
5.0000 mg | ORAL_TABLET | Freq: Four times a day (QID) | ORAL | Status: DC | PRN
  Administered 2015-04-24 – 2015-04-28 (×10): 5 mg via ORAL
  Filled 2015-04-24 (×12): qty 1

## 2015-04-24 MED ORDER — METOPROLOL TARTRATE 50 MG PO TABS
50.0000 mg | ORAL_TABLET | Freq: Two times a day (BID) | ORAL | Status: DC
Start: 2015-04-24 — End: 2015-04-26
  Administered 2015-04-24 – 2015-04-26 (×5): 50 mg via ORAL
  Filled 2015-04-24 (×6): qty 1

## 2015-04-24 MED ORDER — POLYETHYLENE GLYCOL 3350 17 G PO PACK: 17.0000 g | PACK | Freq: Every day | ORAL | Status: DC | PRN

## 2015-04-24 MED ORDER — LORAZEPAM 2 MG/ML IJ SOLN: 1.0000 mg | Freq: Four times a day (QID) | INTRAMUSCULAR | Status: AC | PRN

## 2015-04-24 MED ORDER — VITAMIN B-1 100 MG PO TABS
100.0000 mg | ORAL_TABLET | Freq: Every day | ORAL | Status: DC
  Administered 2015-04-25 – 2015-04-28 (×4): 100 mg via ORAL
  Filled 2015-04-24 (×4): qty 1

## 2015-04-24 MED ORDER — SENNA 8.6 MG PO TABS
1.0000 | ORAL_TABLET | Freq: Two times a day (BID) | ORAL | Status: DC
  Administered 2015-04-24 – 2015-04-28 (×9): 8.6 mg via ORAL
  Filled 2015-04-24 (×10): qty 1

## 2015-04-24 MED ORDER — TAMSULOSIN HCL 0.4 MG PO CAPS
0.4000 mg | ORAL_CAPSULE | Freq: Every day | ORAL | Status: DC
  Administered 2015-04-24 – 2015-04-27 (×4): 0.4 mg via ORAL
  Filled 2015-04-24 (×4): qty 1

## 2015-04-24 MED ORDER — LORAZEPAM 2 MG/ML IJ SOLN
0.0000 mg | Freq: Four times a day (QID) | INTRAMUSCULAR | Status: AC
  Administered 2015-04-26 (×2): 1 mg via INTRAVENOUS
  Filled 2015-04-24 (×2): qty 1

## 2015-04-24 MED ORDER — HYDRALAZINE HCL 50 MG PO TABS
50.0000 mg | ORAL_TABLET | Freq: Three times a day (TID) | ORAL | Status: DC
  Administered 2015-04-24 – 2015-04-26 (×6): 50 mg via ORAL
  Filled 2015-04-24 (×7): qty 1

## 2015-04-24 MED ORDER — SODIUM CHLORIDE 0.9 % IV SOLN
INTRAVENOUS | Status: DC
  Administered 2015-04-24 – 2015-04-27 (×7): via INTRAVENOUS

## 2015-04-24 MED ORDER — INSULIN ASPART 100 UNIT/ML ~~LOC~~ SOLN: 0.0000 [IU] | Freq: Three times a day (TID) | SUBCUTANEOUS | Status: DC

## 2015-04-24 MED ORDER — ADULT MULTIVITAMIN W/MINERALS CH
1.0000 | ORAL_TABLET | Freq: Every day | ORAL | Status: DC
  Administered 2015-04-24 – 2015-04-28 (×5): 1 via ORAL
  Filled 2015-04-24 (×5): qty 1

## 2015-04-24 MED ORDER — HYDROMORPHONE HCL 1 MG/ML IJ SOLN
0.5000 mg | Freq: Four times a day (QID) | INTRAMUSCULAR | Status: DC | PRN
  Administered 2015-04-24 – 2015-04-28 (×12): 0.5 mg via INTRAVENOUS
  Filled 2015-04-24 (×12): qty 1

## 2015-04-24 MED ORDER — METHOCARBAMOL 500 MG PO TABS
500.0000 mg | ORAL_TABLET | Freq: Three times a day (TID) | ORAL | Status: DC | PRN
  Administered 2015-04-24 – 2015-04-27 (×6): 500 mg via ORAL
  Filled 2015-04-24 (×7): qty 1

## 2015-04-24 MED ORDER — GLUCERNA SHAKE PO LIQD
237.0000 mL | Freq: Two times a day (BID) | ORAL | Status: DC
  Administered 2015-04-24 – 2015-04-27 (×4): 237 mL via ORAL

## 2015-04-24 MED ORDER — SODIUM CHLORIDE 0.9 % IJ SOLN
3.0000 mL | Freq: Two times a day (BID) | INTRAMUSCULAR | Status: DC
  Administered 2015-04-24 – 2015-04-26 (×4): 3 mL via INTRAVENOUS

## 2015-04-24 MED ORDER — METHYLPREDNISOLONE SODIUM SUCC 125 MG IJ SOLR
125.0000 mg | Freq: Once | INTRAMUSCULAR | Status: AC
  Administered 2015-04-24: 125 mg via INTRAVENOUS
  Filled 2015-04-24: qty 2

## 2015-04-24 MED ORDER — VITAMIN B-1 100 MG PO TABS
100.0000 mg | ORAL_TABLET | Freq: Every day | ORAL | Status: DC
  Administered 2015-04-24: 100 mg via ORAL
  Filled 2015-04-24: qty 1

## 2015-04-24 NOTE — Progress Notes (Signed)
Patient Demographics  Keith [name], is a 59 y.o. male, DOB - [birth year], NKN:[id number]  Admit date - 04/23/2015   Admitting Physician Toy Baker, MD  Outpatient Primary MD for the patient is Keith Cobble, MD  LOS - 0   Chief Complaint  Patient presents with  . abnormal labs          Subjective:   [name] today has, No headache, No chest pain, No abdominal pain - No Nausea, No Cough - SOB. Reports generalized weakness, itching, and lower back pain.  Assessment & Plan    Active Problems:   Hyperlipidemia   Essential hypertension   Lumbar radiculopathy   Acute renal failure   Elevated LFTs   Alcohol abuse   DM (diabetes mellitus) type II controlled with renal manifestation  Acute renal failure - Appears to be multifactorial, prerenal azotemia as decreased oral intake, as well as evidence of obstructive uropathy as patient had 900 mL on bladder scan. - Foley catheter inserted - Continue with IV fluid - Follow on ultrasound abdomen to evaluate for possible hydronephrosis - Continue to hold hydrochlorothiazide and ACE inhibitor  Urinary retention - Continue with Foley catheter in for now, will attempt voiding trial once renal function started to improve, started on Flomax  Elevated LFTs - Trending down,follow on  Abdominal ultrasound  Hypertension -acceptable blood pressure, continue with clonidine, hydralazine and metoprolol   hyperlipidemia  - Avoid statin giving elevated LFTs   Lumbar radiculopathy - When necessary pain medication  Alcohol abuse - She was counseled, reports last drink was 8 days ago, continue to monitor on CIWA protocol  Diabetes mellitus - Continue with insulin sliding scale  Skin hives - benadryl as needed  Code Status: Full  Family Communication: none at bedside   Disposition Plan: pending further workup   Procedures   None   Consults   None   Medications  Scheduled Meds: . cloNIDine  0.2 mg Oral BID  . docusate sodium  100 mg Oral BID  . folic acid  1 mg Oral Daily  . folic acid  1 mg Oral Daily  . heparin  5,000 Units Subcutaneous 3 times per day  . hydrALAZINE  50 mg Oral TID  . insulin aspart  0-5 Units Subcutaneous QHS  . insulin aspart  0-9 Units Subcutaneous TID WC  . LORazepam  0-4 mg Intravenous Q6H   Followed by  . [START ON 04/26/2015] LORazepam  0-4 mg Intravenous Q12H  . metoprolol  50 mg Oral BID  . multivitamin with minerals  1 tablet Oral Daily  . senna  1 tablet Oral BID  . sodium chloride  3 mL Intravenous Q12H  . thiamine  100 mg Oral Daily   Or  . thiamine  100 mg Intravenous Daily  . thiamine  100 mg Oral Daily   Continuous Infusions: . sodium chloride     PRN Meds:.acetaminophen **OR** acetaminophen, bisacodyl, diphenhydrAMINE, HYDROmorphone (DILAUDID) injection, LORazepam **OR** LORazepam, methocarbamol, ondansetron **OR** ondansetron (ZOFRAN) IV, oxyCODONE, polyethylene glycol  DVT Prophylaxis  Lovenox -   Lab Results  Component Value Date   PLT 159 04/24/2015    Antibiotics    Anti-infectives    None  Objective:   Filed Vitals:   04/24/15 0117 04/24/15 0151 04/24/15 0501 04/24/15 0940  BP: 187/86 165/96 154/85 124/64  Pulse: 66 70 73 50  Temp: 97.9 F (36.6 C) 97.8 F (36.6 C) 98.6 F (37 C) 97.9 F (36.6 C)  TempSrc: Oral Oral Oral Oral  Resp: 14 18 19 20   Height:  6' (1.829 m)    Weight:  73.347 kg (161 lb 11.2 oz)    SpO2: 96% 98% 99% 99%    Wt Readings from Last 3 Encounters:  04/24/15 73.347 kg (161 lb 11.2 oz)  04/23/15 75.297 kg (166 lb)  04/01/15 79.833 kg (176 lb)     Intake/Output Summary (Last 24 hours) at 04/24/15 1409 Last data filed at 04/24/15 1234  Gross per 24 hour  Intake   1400 ml  Output    904 ml  Net    496 ml     Physical Exam  Awake Alert, Oriented X 3, No new F.N deficits, Normal  affect Branch.AT,PERRAL Supple Neck,No JVD, No cervical lymphadenopathy appriciated.  Symmetrical Chest wall movement, Good air movement bilaterally, CTAB RRR,No Gallops,Rubs or new Murmurs, No Parasternal Heave +ve B.Sounds, Abd Soft, No tenderness, No organomegaly appriciated, No rebound - guarding or rigidity. No Cyanosis, Clubbing or edema,  she has hives in leg and chest area.   Data Review   Micro Results Recent Results (from the past 240 hour(s))  Surgical pcr screen     Status: None   Collection Time: 04/23/15  3:12 PM  Result Value Ref Range Status   MRSA, PCR NEGATIVE NEGATIVE Final   Staphylococcus aureus NEGATIVE NEGATIVE Final    Comment:        The Xpert SA Assay (FDA approved for NASAL specimens in patients over 68 years of age), is one component of a comprehensive surveillance program.  Test performance has been validated by [facility] for patients greater than or equal to 99 year old. It is not intended to diagnose infection nor to guide or monitor treatment.     Radiology Reports Dg Chest 2 View  04/24/2015   CLINICAL DATA:  59 year old male with acute renal failure. Preop radiograph.  EXAM: CHEST  2 VIEW  COMPARISON:  04/01/2015  FINDINGS: The heart size and mediastinal contours are within normal limits. Both lungs are clear. The visualized skeletal structures are unremarkable.  IMPRESSION: No active cardiopulmonary disease.   Electronically Signed   By: Anner Crete M.D.   On: 04/24/2015 01:36   Dg Chest 2 View  04/01/2015   CLINICAL DATA:  Hypertension, smoker.  EXAM: CHEST  2 VIEW  COMPARISON:  09/24/2011 and 12/23/2009  FINDINGS: Lungs are adequately inflated without focal consolidation or effusion. Nodule opacity over the posterior lung bases on the lateral film not well seen on the film. Cardiomediastinal silhouette and remainder of the exam is unchanged.  IMPRESSION: Nodule opacity projected over the spine in the lung bases on the lateral film.  Recommend noncontrast chest CT to exclude pulmonary parenchymal nodule.   Electronically Signed   By: Marin Olp M.D.   On: 04/01/2015 13:15     CBC  Recent Labs Lab 04/23/15 1512 04/23/15 2217 04/24/15 0436  WBC 15.7* 14.2* 12.5*  HGB 18.4* 17.5* 15.8  HCT 52.1* 49.2 45.0  PLT 149* 160 159  MCV 95.4 94.3 94.5  MCH 33.7 33.5 33.2  MCHC 35.3 35.6 35.1  RDW 13.3 13.2 13.3  LYMPHSABS 2.2 2.4  --   MONOABS 1.6* 1.4*  --  EOSABS 0.8* 0.9*  --   BASOSABS 0.0 0.0  --     Chemistries   Recent Labs Lab 04/23/15 1513 04/23/15 2216 04/24/15 0021 04/24/15 0436  NA 132* 135  --  135  K 4.2 3.8  --  3.8  CL 94* 92*  --  99*  CO2 23 29  --  26  GLUCOSE 187* 173*  --  158*  BUN 49* 49*  --  48*  CREATININE 3.72* 4.19*  --  4.31*  CALCIUM 10.0 9.8  --  8.9  MG  --   --  2.5* 2.6*  AST 133* 108*  --  100*  ALT 208* 208*  --  182*  ALKPHOS 442* 404*  --  339*  BILITOT 1.2 1.1  --  0.9   ------------------------------------------------------------------------------------------------------------------ estimated creatinine clearance is 18.7 mL/min (by C-G formula based on Cr of 4.31). ------------------------------------------------------------------------------------------------------------------ No results for input(s): HGBA1C in the last 72 hours. ------------------------------------------------------------------------------------------------------------------ No results for input(s): CHOL, HDL, LDLCALC, TRIG, CHOLHDL, LDLDIRECT in the last 72 hours. ------------------------------------------------------------------------------------------------------------------  Recent Labs  04/24/15 0436  TSH 0.828   ------------------------------------------------------------------------------------------------------------------ No results for input(s): VITAMINB12, FOLATE, FERRITIN, TIBC, IRON, RETICCTPCT in the last 72 hours.  Coagulation profile  Recent Labs Lab 04/23/15 1512  04/24/15 0021  INR 1.10 1.06    No results for input(s): DDIMER in the last 72 hours.  Cardiac Enzymes No results for input(s): CKMB, TROPONINI, MYOGLOBIN in the last 168 hours.  Invalid input(s): CK ------------------------------------------------------------------------------------------------------------------ Invalid input(s): POCBNP     Time Spent in minutes   30 minutes   Lavaris Sexson M.D on 04/24/2015 at 2:09 PM  Between 7am to 7pm - Pager - [phone]  After 7pm go to [email or web address] - password [facility]  [facility]   Office  [phone]

## 2015-04-24 NOTE — Progress Notes (Signed)
Patient notified this RN of his hives spreading/itching getting worse. Dr. Waldron Labs notified. New orders received.  Joellen Jersey, RN.

## 2015-04-24 NOTE — Progress Notes (Signed)
Patient's HR sustaining 50-60's, has clonidine, hydralazine, and metoprolol all scheduled for now. Dr. Waldron Labs notified who stated it was ok to hold these meds. Will monitor.  Joellen Jersey, RN.

## 2015-04-24 NOTE — Progress Notes (Signed)
Report received 

## 2015-04-24 NOTE — Progress Notes (Signed)
Anesthesia Chart Review: Patient is a 59 year old male scheduled for L3-4, L4-5 MAS, PLIF on 04/25/15 by Dr. Ronnald Ramp. However, I was called after 5PM yesterday regarding newly elevated renal function (1.02 to 3.72 in three weeks) and elevated LFTs, and patient was instructed to present to the ED for further evaluation. He is now admitted to the Hospitalist service on [place] acute onset renal failure, dehydration, elevated LFTs in the setting of ETOH abuse. PCP is Dr. [name].  History includes former smoker as of 04/19/15, HTN, DM2 (pre-diabetes).  Meds currently listed are clonidine, hydralazine, losartan-HCTZ, metoprolol, Percocet.  04/24/15 CXR: IMPRESSION: No active cardiopulmonary disease.  Labs from 04/23/15-04/24/15 0436 noted. Lastest Cr 4.31. K 3.8. Glucose 158. Alk Phos 339, AST 100, ALT 181. WBC 12.5, H/H 15.8/45.0, PLT 159K. PT/INR WNL. Acetaminophen level < 10. TSH 0.828. A1C on 04/01/15 was 6.1.   04/23/15 EKG showed NSR. Question of lateral infarct, but suspect limb lead reversal.  Before he ultimately has surgery, I would like a repeat EKG to verify. (Had normal EKG in 2013.)  Hospitalist have yet to write a note this morning. There is mention of a renal U/S and consideration of renal consult. ACE/ARB and statin therapy are on hold. I did notify Dr. Ronnald Ramp this morning of patient's admission and that acute issues would need to be addressed prior to undergoing surgery.   George Hugh [facility]iology Phone [phone] 04/24/2015 9:48 AM

## 2015-04-24 NOTE — Progress Notes (Signed)
Attempted to get report. RN to call back.

## 2015-04-24 NOTE — Progress Notes (Signed)
New Admission Note:  Arrival Method: Via stretcher with nurse Tech  Mental Orientation: Alert & oriented x4 Telemetry: box 10, CCMD notified Assessment: Completed Skin: Generalized redness/ rash  IV: left AC Pain: 9/10, see MAR Tubes: N/A Safety Measures: Safety Fall Prevention Plan given and discussed. Admission: Completed [address] [place] Orientation: Patient has been orientated to the room, unit and the staff. Family: none at bedside  Orders have been reviewed and implemented. Will continue to monitor the patient. Call light has been placed within reach and bed alarm has been activated.   Leandro Reasoner BSN, RN  Phone Number: [phone] [facility]

## 2015-04-24 NOTE — Progress Notes (Signed)
Initial Nutrition Assessment  DOCUMENTATION CODES:   Severe malnutrition in context of acute illness/injury   Pt meets criteria for SEVERE MALNUTRITION in the context of acute illness/injury as evidenced by a weight loss of 8.5% in 1 month and energy intake </= 50% for >/= 5 days PTA.  INTERVENTION:   Provide Glucerna Shake po BID, each supplement provides 220 kcal and 10 grams of protein.  Encourage adequate PO intake.   NUTRITION DIAGNOSIS:   Increased nutrient needs related to acute illness as evidenced by estimated needs.  GOAL:   Patient will meet greater than or equal to 90% of their needs  MONITOR:   PO intake, Supplement acceptance, Weight trends, Labs, I & O's  REASON FOR ASSESSMENT:   Malnutrition Screening Tool    ASSESSMENT:   59 year old gentleman with history of poorly controlled hypertension, alcohol abuse, diabetes and radicular back pain presents with acute onset of renal failure evidence of dehydration and LFT elevations  Meal completion has been 100%. PTA pt reports not eating well with usual consumption of only 1 meal a day along with a snack before bedtime. Pt with weight loss. Per Epic weight records, pt with a 8.5% weight loss in 1 month. Pt is unaware of reason for weight loss. Pt is agreeable to nutritional supplements. RD to order.   Nutrition-Focused physical exam completed. Findings are no fat depletion, mild muscle depletion, and mild edema.   Labs: Low chloride and GFR. High BUN, creatinine, ALT, AST, alkaline phosphatase.   Diet Order:  Diet Carb Modified Fluid consistency:: Thin; Room service appropriate?: Yes  Skin:  Reviewed, no issues  Last BM:  PTA  Height:   Ht Readings from Last 1 Encounters:  04/24/15 6' (1.829 m)    Weight:   Wt Readings from Last 1 Encounters:  04/24/15 161 lb 11.2 oz (73.347 kg)    Ideal Body Weight:  80.9 kg  BMI:  Body mass index is 21.93 kg/(m^2).  Estimated Nutritional Needs:   Kcal:   2000-2200  Protein:  100-110 grams  Fluid:  Per MD  EDUCATION NEEDS:   No education needs identified at this time  Corrin Parker, MS, RD, LDN Pager # [phone] After hours/ weekend pager # [phone]

## 2015-04-24 NOTE — Progress Notes (Signed)
Utilization review completed. Brittony Billick, RN, BSN. 

## 2015-04-24 NOTE — ED Notes (Signed)
Pt going to xray  

## 2015-04-25 ENCOUNTER — Encounter (HOSPITAL_COMMUNITY): Admission: RE | Payer: Self-pay | Source: Ambulatory Visit

## 2015-04-25 ENCOUNTER — Inpatient Hospital Stay (HOSPITAL_COMMUNITY)
Admission: RE | Admit: 2015-04-25 | Payer: No Typology Code available for payment source | Source: Ambulatory Visit | Admitting: Neurological Surgery

## 2015-04-25 DIAGNOSIS — E43 Unspecified severe protein-calorie malnutrition: Secondary | ICD-10-CM | POA: Insufficient documentation

## 2015-04-25 LAB — COMPREHENSIVE METABOLIC PANEL
ALBUMIN: 2.5 g/dL — AB (ref 3.5–5.0)
ALK PHOS: 320 U/L — AB (ref 38–126)
ALT: 167 U/L — ABNORMAL HIGH (ref 17–63)
ANION GAP: 9 (ref 5–15)
AST: 69 U/L — ABNORMAL HIGH (ref 15–41)
BILIRUBIN TOTAL: 0.7 mg/dL (ref 0.3–1.2)
BUN: 58 mg/dL — AB (ref 6–20)
CALCIUM: 8.8 mg/dL — AB (ref 8.9–10.3)
CO2: 24 mmol/L (ref 22–32)
Chloride: 103 mmol/L (ref 101–111)
Creatinine, Ser: 4.17 mg/dL — ABNORMAL HIGH (ref 0.61–1.24)
GFR calc Af Amer: 16 mL/min — ABNORMAL LOW (ref >60)
GFR calc non Af Amer: 14 mL/min — ABNORMAL LOW (ref >60)
GLUCOSE: 194 mg/dL — AB (ref 65–99)
Potassium: 4.6 mmol/L (ref 3.5–5.1)
Sodium: 136 mmol/L (ref 135–145)
TOTAL PROTEIN: 5.3 g/dL — AB (ref 6.5–8.1)

## 2015-04-25 LAB — URINE CULTURE
Culture: NO GROWTH
Culture: NO GROWTH

## 2015-04-25 LAB — HEPATITIS PANEL, ACUTE
HCV Ab: <0.1 s/co ratio (ref 0.0–0.9)
HEP B S AG: NEGATIVE
Hep A IgM: NEGATIVE
Hep B C IgM: NEGATIVE

## 2015-04-25 LAB — CBC
HEMATOCRIT: 40.9 % (ref 39.0–52.0)
HEMOGLOBIN: 13.9 g/dL (ref 13.0–17.0)
MCH: 32.9 pg (ref 26.0–34.0)
MCHC: 34 g/dL (ref 30.0–36.0)
MCV: 96.9 fL (ref 78.0–100.0)
Platelets: 160 10*3/uL (ref 150–400)
RBC: 4.22 MIL/uL (ref 4.22–5.81)
RDW: 13.6 % (ref 11.5–15.5)
WBC: 5.4 10*3/uL (ref 4.0–10.5)

## 2015-04-25 LAB — HEMOGLOBIN A1C
HEMOGLOBIN A1C: 6.8 % — AB (ref 4.8–5.6)
MEAN PLASMA GLUCOSE: 148 mg/dL

## 2015-04-25 LAB — GLUCOSE, CAPILLARY
GLUCOSE-CAPILLARY: 119 mg/dL — AB (ref 65–99)
GLUCOSE-CAPILLARY: 126 mg/dL — AB (ref 65–99)
Glucose-Capillary: 167 mg/dL — ABNORMAL HIGH (ref 65–99)
Glucose-Capillary: 124 mg/dL — ABNORMAL HIGH (ref 65–99)

## 2015-04-25 SURGERY — FOR MAXIMUM ACCESS (MAS) POSTERIOR LUMBAR INTERBODY FUSION (PLIF) 2 LEVEL
Anesthesia: General | Site: Back

## 2015-04-25 NOTE — Consult Note (Signed)
[facility] Consult Note     Date: 04/25/2015                  Patient Name:  Keith [name]  MRN: [id number]  DOB: 07-20-1956  Age / Sex: 59 y.o., male         PCP: [name], MD                 Service Requesting Consult: Hospitalist                 Reason for Consult: Acute Renal Failure            Chief Complaint: abnormal labs HPI: [name] is a 59 year old man with history of poorly controlled hypertension, DM2 presented for preoperative evaluation for back surgery scheduled 04/25/2015 found to have acute renal failure with creatinine of 4.19 increased from baseline of about 1.0 in August 2016. He is a Administrator and at baseline, he eats about one meal a day in the evenings. In the morning he has some coffee and drinks mountain dew during the day with some water. 3 days prior to this, he did note some dry mouth for which he tried to drink more water. He noted some darkening of his urine. Denies polyuria, dysuria, dribbling. He has been taking Percocet for back pain. He was previously on Celebrex but last took any 6 weeks ago. Denies NSAIDs. He also is on losartan-HCTZ. He has a history of alcohol abuse with which he drinks about a 6 pack per day and stopped 5 days ago in preparation for back surgery. He has lost 6 lbs over the last 3 months per admission HPI.  No new complaints today. He had some urinary retention for which a foley was placed.  Past Medical History  Diagnosis Date  . Hypertension   . Colon polyp   . Diverticulosis   . Tobacco abuse   . Diabetes mellitus without complication     borderline; not treated    Past Surgical History  Procedure Laterality Date  . Wisdom tooth extraction    . 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 Colonoscopy with polypectomy      polyp X 1    Family History  Problem Relation Age of Onset  . Stroke Father   . Melanoma Father   . Stomach cancer Father   . Alcohol abuse  Father   . Diabetes Mother   . Breast cancer Sister   . Diabetes Brother   . Colon cancer Neg Hx   . Esophageal cancer Neg Hx   . Rectal cancer Neg Hx    Social History:  reports that he quit smoking 6 days ago. His smoking use included Cigarettes. He has a 80 pack-year smoking history. He has never used smokeless tobacco. He reports that he drinks about 14.4 oz of alcohol per week. He reports that he does not use illicit drugs.  Allergies:  Allergies  Allergen Reactions  . Bupropion Hcl     REACTION: rash @ pressure areas (waist, groin , axillae)  . Penicillins     ? Rash , hives  . Amoxicillin Itching and Rash    Medications Prior to Admission  Medication Sig Dispense Refill  . cloNIDine (CATAPRES) 0.2 MG tablet Take 1 tablet (0.2 mg total) by mouth 2 (two) times daily. 60 tablet 2  . hydrALAZINE (APRESOLINE) 25 MG tablet Take 1  tablet (25 mg total) by mouth 3 (three) times daily. (Patient taking differently: Take 50 mg by mouth 3 (three) times daily. ) 90 tablet 2  . losartan-hydrochlorothiazide (HYZAAR) 100-12.5 MG per tablet Take 1 tablet by mouth daily. 90 tablet 0  . metoprolol (LOPRESSOR) 50 MG tablet Take 1 tablet (50 mg total) by mouth 2 (two) times daily. 60 tablet 2  . oxyCODONE-acetaminophen (PERCOCET/ROXICET) 5-325 MG per tablet Take 1 tablet by mouth every 4 (four) hours as needed for moderate pain.       Results for orders placed or performed during the hospital encounter of 04/23/15 (from the past 48 hour(s))  Comprehensive metabolic panel     Status: Abnormal   Collection Time: 04/23/15 10:16 PM  Result Value Ref Range   Sodium 135 135 - 145 mmol/L   Potassium 3.8 3.5 - 5.1 mmol/L   Chloride 92 (L) 101 - 111 mmol/L   CO2 29 22 - 32 mmol/L   Glucose, Bld 173 (H) 65 - 99 mg/dL   BUN 49 (H) 6 - 20 mg/dL   Creatinine, Ser 4.19 (H) 0.61 - 1.24 mg/dL   Calcium 9.8 8.9 - 10.3 mg/dL   Total Protein 7.1 6.5 - 8.1 g/dL   Albumin 3.4 (L) 3.5 - 5.0 g/dL   AST 108 (H)  15 - 41 U/L   ALT 208 (H) 17 - 63 U/L   Alkaline Phosphatase 404 (H) 38 - 126 U/L   Total Bilirubin 1.1 0.3 - 1.2 mg/dL   GFR calc non Af Amer 14 (L) >60 mL/min   GFR calc Af Amer 16 (L) >60 mL/min    Comment: (NOTE) The eGFR has been calculated using the CKD EPI equation. This calculation has not been validated in all clinical situations. eGFR's persistently <60 mL/min signify possible Chronic Kidney Disease.    Anion gap 14 5 - 15  CBC with Differential/Platelet     Status: Abnormal   Collection Time: 04/23/15 10:17 PM  Result Value Ref Range   WBC 14.2 (H) 4.0 - 10.5 K/uL   RBC 5.22 4.22 - 5.81 MIL/uL   Hemoglobin 17.5 (H) 13.0 - 17.0 g/dL   HCT 49.2 39.0 - 52.0 %   MCV 94.3 78.0 - 100.0 fL   MCH 33.5 26.0 - 34.0 pg   MCHC 35.6 30.0 - 36.0 g/dL   RDW 13.2 11.5 - 15.5 %   Platelets 160 150 - 400 K/uL   Neutrophils Relative % 67 %   Lymphocytes Relative 17 %   Monocytes Relative 10 %   Eosinophils Relative 6 %   Basophils Relative 0 %   Neutro Abs 9.5 (H) 1.7 - 7.7 K/uL   Lymphs Abs 2.4 0.7 - 4.0 K/uL   Monocytes Absolute 1.4 (H) 0.1 - 1.0 K/uL   Eosinophils Absolute 0.9 (H) 0.0 - 0.7 K/uL   Basophils Absolute 0.0 0.0 - 0.1 K/uL   RBC Morphology TARGET CELLS    WBC Morphology INCREASED BANDS (>20% BANDS)     Comment: ATYPICAL LYMPHOCYTES  Urinalysis, Routine w reflex microscopic (not at [facility])     Status: Abnormal   Collection Time: 04/23/15 10:35 PM  Result Value Ref Range   Color, Urine AMBER (A) YELLOW    Comment: BIOCHEMICALS MAY BE AFFECTED BY COLOR   APPearance CLOUDY (A) CLEAR   Specific Gravity, Urine 1.015 1.005 - 1.030   pH 5.0 5.0 - 8.0   Glucose, UA NEGATIVE NEGATIVE mg/dL   Hgb urine dipstick NEGATIVE NEGATIVE  Bilirubin Urine SMALL (A) NEGATIVE   Ketones, ur NEGATIVE NEGATIVE mg/dL   Protein, ur 100 (A) NEGATIVE mg/dL   Urobilinogen, UA 1.0 0.0 - 1.0 mg/dL   Nitrite NEGATIVE NEGATIVE   Leukocytes, UA SMALL (A) NEGATIVE  Urine microscopic-add on      Status: Abnormal   Collection Time: 04/23/15 10:35 PM  Result Value Ref Range   Squamous Epithelial / LPF RARE RARE   WBC, UA 7-10 <3 WBC/hpf   RBC / HPF 3-6 <3 RBC/hpf   Bacteria, UA FEW (A) RARE  Creatinine, urine, random     Status: None   Collection Time: 04/23/15 10:35 PM  Result Value Ref Range   Creatinine, Urine 134.07 mg/dL  Sodium, urine, random     Status: None   Collection Time: 04/23/15 10:35 PM  Result Value Ref Range   Sodium, Ur 19 mmol/L  Urine culture     Status: None   Collection Time: 04/23/15 10:35 PM  Result Value Ref Range   Specimen Description URINE, RANDOM    Special Requests NONE    Culture NO GROWTH 1 DAY    Report Status 04/25/2015 FINAL   Protime-INR     Status: None   Collection Time: 04/24/15 12:21 AM  Result Value Ref Range   Prothrombin Time 14.0 11.6 - 15.2 seconds   INR 1.06 0.00 - 1.49  CK     Status: Abnormal   Collection Time: 04/24/15 12:21 AM  Result Value Ref Range   Total CK 23 (L) 49 - 397 U/L  Acetaminophen level     Status: Abnormal   Collection Time: 04/24/15 12:21 AM  Result Value Ref Range   Acetaminophen (Tylenol), Serum <10 (L) 10 - 30 ug/mL    Comment:        THERAPEUTIC CONCENTRATIONS VARY SIGNIFICANTLY. A RANGE OF 10-30 ug/mL MAY BE AN EFFECTIVE CONCENTRATION FOR MANY PATIENTS. HOWEVER, SOME ARE BEST TREATED AT CONCENTRATIONS OUTSIDE THIS RANGE. ACETAMINOPHEN CONCENTRATIONS >150 ug/mL AT 4 HOURS AFTER INGESTION AND >50 ug/mL AT 12 HOURS AFTER INGESTION ARE OFTEN ASSOCIATED WITH TOXIC REACTIONS.   Hepatitis panel, acute     Status: None   Collection Time: 04/24/15 12:21 AM  Result Value Ref Range   Hepatitis B Surface Ag Negative Negative   HCV Ab <0.1 0.0 - 0.9 s/co ratio    Comment: (NOTE)                                  Negative:     < 0.8                             Indeterminate: 0.8 - 0.9                                  Positive:     > 0.9 The CDC recommends that a positive HCV antibody result be  followed up with a HCV Nucleic Acid Amplification test ([id number]). Performed At: [facility] [place], [place] [id number] Lindon Romp MD TY:[id number]    Hep A IgM Negative Negative   Hep B C IgM Negative Negative  Lipase, blood     Status: Abnormal   Collection Time: 04/24/15 12:21 AM  Result Value Ref Range   Lipase 53 (H) 22 - 51 U/L  Magnesium     Status: Abnormal   Collection Time: 04/24/15 12:21 AM  Result Value Ref Range   Magnesium 2.5 (H) 1.7 - 2.4 mg/dL  Phosphorus     Status: None   Collection Time: 04/24/15 12:21 AM  Result Value Ref Range   Phosphorus 3.7 2.5 - 4.6 mg/dL  Glucose, capillary     Status: Abnormal   Collection Time: 04/24/15  1:38 AM  Result Value Ref Range   Glucose-Capillary 149 (H) 65 - 99 mg/dL  Hemoglobin A1c     Status: Abnormal   Collection Time: 04/24/15  4:36 AM  Result Value Ref Range   Hgb A1c MFr Bld 6.8 (H) 4.8 - 5.6 %    Comment: (NOTE)         Pre-diabetes: 5.7 - 6.4         Diabetes: >6.4         Glycemic control for adults with diabetes: <7.0    Mean Plasma Glucose 148 mg/dL    Comment: (NOTE) Performed At: [facility] [place], [place] [id number] Lindon Romp MD EG:[id number]   Magnesium     Status: Abnormal   Collection Time: 04/24/15  4:36 AM  Result Value Ref Range   Magnesium 2.6 (H) 1.7 - 2.4 mg/dL  Phosphorus     Status: None   Collection Time: 04/24/15  4:36 AM  Result Value Ref Range   Phosphorus 4.3 2.5 - 4.6 mg/dL  TSH     Status: None   Collection Time: 04/24/15  4:36 AM  Result Value Ref Range   TSH 0.828 0.350 - 4.500 uIU/mL  Comprehensive metabolic panel     Status: Abnormal   Collection Time: 04/24/15  4:36 AM  Result Value Ref Range   Sodium 135 135 - 145 mmol/L   Potassium 3.8 3.5 - 5.1 mmol/L   Chloride 99 (L) 101 - 111 mmol/L   CO2 26 22 - 32 mmol/L   Glucose, Bld 158 (H) 65 - 99 mg/dL   BUN 48 (H) 6 - 20 mg/dL   Creatinine, Ser 4.31 (H) 0.61  - 1.24 mg/dL   Calcium 8.9 8.9 - 10.3 mg/dL   Total Protein 6.0 (L) 6.5 - 8.1 g/dL   Albumin 3.0 (L) 3.5 - 5.0 g/dL   AST 100 (H) 15 - 41 U/L   ALT 182 (H) 17 - 63 U/L   Alkaline Phosphatase 339 (H) 38 - 126 U/L   Total Bilirubin 0.9 0.3 - 1.2 mg/dL   GFR calc non Af Amer 14 (L) >60 mL/min   GFR calc Af Amer 16 (L) >60 mL/min    Comment: (NOTE) The eGFR has been calculated using the CKD EPI equation. This calculation has not been validated in all clinical situations. eGFR's persistently <60 mL/min signify possible Chronic Kidney Disease.    Anion gap 10 5 - 15  CBC     Status: Abnormal   Collection Time: 04/24/15  4:36 AM  Result Value Ref Range   WBC 12.5 (H) 4.0 - 10.5 K/uL   RBC 4.76 4.22 - 5.81 MIL/uL   Hemoglobin 15.8 13.0 - 17.0 g/dL   HCT 45.0 39.0 - 52.0 %   MCV 94.5 78.0 - 100.0 fL   MCH 33.2 26.0 - 34.0 pg   MCHC 35.1 30.0 - 36.0 g/dL   RDW 13.3 11.5 - 15.5 %   Platelets 159 150 - 400 K/uL  Glucose, capillary     Status: Abnormal  Collection Time: 04/24/15  7:33 AM  Result Value Ref Range   Glucose-Capillary 150 (H) 65 - 99 mg/dL   Comment 1 Notify RN    Comment 2 Document in Chart   Glucose, capillary     Status: Abnormal   Collection Time: 04/24/15 11:59 AM  Result Value Ref Range   Glucose-Capillary 138 (H) 65 - 99 mg/dL   Comment 1 Notify RN    Comment 2 Document in Chart   Creatinine, urine, random     Status: None   Collection Time: 04/24/15 12:30 PM  Result Value Ref Range   Creatinine, Urine 99.46 mg/dL  Sodium, urine, random     Status: None   Collection Time: 04/24/15 12:30 PM  Result Value Ref Range   Sodium, Ur 28 mmol/L  Culture, Urine     Status: None   Collection Time: 04/24/15 12:30 PM  Result Value Ref Range   Specimen Description URINE, RANDOM    Special Requests NONE    Culture NO GROWTH 1 DAY    Report Status 04/25/2015 FINAL   Glucose, capillary     Status: Abnormal   Collection Time: 04/24/15  4:26 PM  Result Value Ref Range    Glucose-Capillary 132 (H) 65 - 99 mg/dL   Comment 1 Notify RN    Comment 2 Document in Chart   Glucose, capillary     Status: Abnormal   Collection Time: 04/24/15  9:06 PM  Result Value Ref Range   Glucose-Capillary 164 (H) 65 - 99 mg/dL  CBC     Status: None   Collection Time: 04/25/15  5:12 AM  Result Value Ref Range   WBC 5.4 4.0 - 10.5 K/uL   RBC 4.22 4.22 - 5.81 MIL/uL   Hemoglobin 13.9 13.0 - 17.0 g/dL   HCT 40.9 39.0 - 52.0 %   MCV 96.9 78.0 - 100.0 fL   MCH 32.9 26.0 - 34.0 pg   MCHC 34.0 30.0 - 36.0 g/dL   RDW 13.6 11.5 - 15.5 %   Platelets 160 150 - 400 K/uL  Comprehensive metabolic panel     Status: Abnormal   Collection Time: 04/25/15  5:12 AM  Result Value Ref Range   Sodium 136 135 - 145 mmol/L   Potassium 4.6 3.5 - 5.1 mmol/L    Comment: DELTA CHECK NOTED   Chloride 103 101 - 111 mmol/L   CO2 24 22 - 32 mmol/L   Glucose, Bld 194 (H) 65 - 99 mg/dL   BUN 58 (H) 6 - 20 mg/dL   Creatinine, Ser 4.17 (H) 0.61 - 1.24 mg/dL   Calcium 8.8 (L) 8.9 - 10.3 mg/dL   Total Protein 5.3 (L) 6.5 - 8.1 g/dL   Albumin 2.5 (L) 3.5 - 5.0 g/dL   AST 69 (H) 15 - 41 U/L   ALT 167 (H) 17 - 63 U/L   Alkaline Phosphatase 320 (H) 38 - 126 U/L   Total Bilirubin 0.7 0.3 - 1.2 mg/dL   GFR calc non Af Amer 14 (L) >60 mL/min   GFR calc Af Amer 16 (L) >60 mL/min    Comment: (NOTE) The eGFR has been calculated using the CKD EPI equation. This calculation has not been validated in all clinical situations. eGFR's persistently <60 mL/min signify possible Chronic Kidney Disease.    Anion gap 9 5 - 15  Glucose, capillary     Status: Abnormal   Collection Time: 04/25/15  7:37 AM  Result Value Ref Range  Glucose-Capillary 167 (H) 65 - 99 mg/dL  Glucose, capillary     Status: Abnormal   Collection Time: 04/25/15 11:39 AM  Result Value Ref Range   Glucose-Capillary 124 (H) 65 - 99 mg/dL   Dg Chest 2 View  04/24/2015   CLINICAL DATA:  59 year old male with acute renal failure. Preop  radiograph.  EXAM: CHEST  2 VIEW  COMPARISON:  04/01/2015  FINDINGS: The heart size and mediastinal contours are within normal limits. Both lungs are clear. The visualized skeletal structures are unremarkable.  IMPRESSION: No active cardiopulmonary disease.   Electronically Signed   By: Anner Crete M.D.   On: 04/24/2015 01:36   US Abdomen Complete  04/24/2015   CLINICAL DATA:  Acute renal failure  EXAM: ULTRASOUND ABDOMEN COMPLETE  COMPARISON:  None.  FINDINGS: Gallbladder: Gallbladder is full and there are internal mid level echoes layering. No definitive, shadowing stone. No wall thickening or focal tenderness.  Common bile duct: Diameter: 4 mm. Where visualized, no filling defect.  Liver: No focal lesion identified. Within normal limits in parenchymal echogenicity. Antegrade flow in the imaged portal venous system.  IVC: No abnormality visualized.  Pancreas: Visualized portion unremarkable.  Spleen: Size and appearance within normal limits.  Right Kidney: Length: 11 cm. Echogenicity within normal limits. No mass or hydronephrosis visualized.  Left Kidney: Length: 12 cm. Echogenicity within normal limits. No mass or hydronephrosis visualized.  Abdominal aorta: Scattered atherosclerotic wall calcification. No visualized aneurysm.  IMPRESSION: 1. No hydronephrosis. 2. Gallbladder sludge   Electronically Signed   By: Monte Fantasia M.D.   On: 04/24/2015 15:15    ROS Constitutional: no fevers/chills Eyes: no vision changes Ears, nose, mouth, throat, and face: no cough Respiratory: no shortness of breath Cardiovascular: no chest pain Gastrointestinal: no nausea/vomiting, no abdominal pain, no constipation, no diarrhea Genitourinary: no dysuria, no hematuria Integument: no rash Hematologic/lymphatic: no bleeding/bruising, no edema Musculoskeletal: +chronic back pain Neurological: +chronic right leg paresthesias, no weakness  Blood pressure 138/64, pulse 72, temperature 98.2 F (36.8 C),  temperature source Oral, resp. rate 18, height 6' (1.829 m), weight 165 lb 14.4 oz (75.252 kg), SpO2 98 %. Physical Exam  General Apperance: NAD Head: Normocephalic, atraumatic Eyes: PERRL, EOMI, anicteric sclera Ears: Normal external ear canal Nose: Nares normal, septum midline, mucosa normal Throat: Lips, mucosa and tongue normal  Neck: Supple, trachea midline Back: No bony abnormality  Lungs: Clear to auscultation bilaterally. No wheezes, rhonchi or rales. Breathing comfortably on room air Chest Wall: Nontender, no deformity Heart: Regular rate and rhythm, no murmur/rub/gallop Abdomen: Soft, nontender, nondistended, no rebound/guarding Extremities: Normal, atraumatic, warm and well perfused, no edema Pulses: 2+ throughout Skin: No rashes or lesions Neurologic: Alert and oriented x 3. CNII-XII intact. Decreased sensation on RLE but otherwise normal strength and sensation  Assessment/Plan Acute renal failure - Creatinine peaked at 4.31 on 04/24/2015 from admission creatinine of 3.72 with baseline of 1.0. UA with 100 protein and UCx with no growth. FENa <1%. Renal US unremarkable. He has been receiving NS at 144m/hr. Likely secondary volume depletion in the setting of ARB and diuretic use.   Recommend: -Continue to hold losartan and HCTZ -Avoid nephrotoxins including NSAIDs -Continue IV fluids -Continue to monitor urine output, daily weight -Continue to monitor renal function  Discussed with attending, Dr. PCyndi Lennert MD Internal Medicine PGY-2  04/25/2015, 12:31 PM

## 2015-04-25 NOTE — Progress Notes (Signed)
Patient Demographics  Keith [name], is a 59 y.o. male, DOB - Dec 11, 1955, JOA:[id number]  Admit date - 04/23/2015   Admitting Physician Toy Baker, MD  Outpatient Primary MD for the patient is [name], MD  LOS - 1   Chief Complaint  Patient presents with  . abnormal labs          Subjective:   Keith [name] today has, No headache, No chest pain, No abdominal pain - No Nausea, No Cough - SOB. Reports generalized weakness, itching, and lower back pain.  Assessment & Plan    Active Problems:   Hyperlipidemia   Essential hypertension   Lumbar radiculopathy   Acute renal failure   Elevated LFTs   Alcohol abuse   DM (diabetes mellitus) type II controlled with renal manifestation   Protein-calorie malnutrition, severe  Acute renal failure - Appears to be multifactorial, prerenal azotemia from volume depletion secondary to decreased oral intake, as well as evidence of obstructive uropathy as patient had 900 mL on bladder scan. - Foley catheter inserted - Continue with IV fluid - Ultrasound abdomen with no evidence of hydronephrosis - Continue to hold hydrochlorothiazide and losartan - Nephrology consult appreciated  Urinary retention - Continue with Foley catheter in for now, will attempt voiding trial once renal function started to improve, started on Flomax  Elevated LFTs - Trending down,follow on  Abdominal ultrasound difficult for gallbladder sludge, no evidence of cholecystitis, normal right upper quadrant pain.  Hypertension -acceptable blood pressure, continue with clonidine, hydralazine and metoprolol   hyperlipidemia  - Avoid statin giving elevated LFTs   Lumbar radiculopathy - When necessary pain medication  Alcohol abuse - he was counseled, reports last drink was 8 daysprevertebral admission, continue to monitor on CIWA protocol  Diabetes mellitus -CBGs  acceptable  with insulin sliding scale  Skin hives - benadryl as needed - Much improved   Code Status: Full  Family Communication: none at bedside   Disposition Plan: pending further workup   Procedures  None   Consults   None   Medications  Scheduled Meds: . cloNIDine  0.2 mg Oral BID  . docusate sodium  100 mg Oral BID  . feeding supplement (GLUCERNA SHAKE)  237 mL Oral BID BM  . folic acid  1 mg Oral Daily  . heparin  5,000 Units Subcutaneous 3 times per day  . hydrALAZINE  50 mg Oral TID  . insulin aspart  0-5 Units Subcutaneous QHS  . insulin aspart  0-9 Units Subcutaneous TID WC  . LORazepam  0-4 mg Intravenous Q6H   Followed by  . [START ON 04/26/2015] LORazepam  0-4 mg Intravenous Q12H  . metoprolol  50 mg Oral BID  . multivitamin with minerals  1 tablet Oral Daily  . senna  1 tablet Oral BID  . sodium chloride  3 mL Intravenous Q12H  . tamsulosin  0.4 mg Oral QPC supper  . thiamine  100 mg Oral Daily   Or  . thiamine  100 mg Intravenous Daily   Continuous Infusions: . sodium chloride 100 mL/hr at 04/24/15 2327   PRN Meds:.acetaminophen **OR** acetaminophen, bisacodyl, diphenhydrAMINE, HYDROmorphone (DILAUDID) injection, LORazepam **OR** LORazepam, methocarbamol, ondansetron **OR** ondansetron (ZOFRAN) IV, oxyCODONE, polyethylene glycol  DVT Prophylaxis  Lovenox -   Lab Results  Component Value Date   PLT 160 04/25/2015    Antibiotics    Anti-infectives    None          Objective:   Filed Vitals:   04/24/15 1744 04/24/15 2102 04/25/15 0516 04/25/15 0900  BP: 132/63 141/72 141/71 138/64  Pulse: 75 71 48 72  Temp: 98.8 F (37.1 C) 98.6 F (37 C) 97.4 F (36.3 C) 98.2 F (36.8 C)  TempSrc: Oral Oral Oral Oral  Resp: 20 18 18 18   Height:      Weight:   75.252 kg (165 lb 14.4 oz)   SpO2: 98% 97% 99% 98%    Wt Readings from Last 3 Encounters:  04/25/15 75.252 kg (165 lb 14.4 oz)  04/23/15 75.297 kg (166 lb)  04/01/15 79.833 kg  (176 lb)     Intake/Output Summary (Last 24 hours) at 04/25/15 1652 Last data filed at 04/25/15 1611  Gross per 24 hour  Intake 3243.33 ml  Output   2350 ml  Net 893.33 ml     Physical Exam  Awake Alert, Oriented X 3, No new F.N deficits, Normal affect Casey.AT,PERRAL Supple Neck,No JVD, No cervical lymphadenopathy appriciated.  Symmetrical Chest wall movement, Good air movement bilaterally, CTAB RRR,No Gallops,Rubs or new Murmurs, No Parasternal Heave +ve B.Sounds, Abd Soft, No tenderness, No organomegaly appriciated, No rebound - guarding or rigidity. No Cyanosis, Clubbing or edema,   hives resolved   Data Review   Micro Results Recent Results (from the past 240 hour(s))  Surgical pcr screen     Status: None   Collection Time: 04/23/15  3:12 PM  Result Value Ref Range Status   MRSA, PCR NEGATIVE NEGATIVE Final   Staphylococcus aureus NEGATIVE NEGATIVE Final    Comment:        The Xpert SA Assay (FDA approved for NASAL specimens in patients over 45 years of age), is one component of a comprehensive surveillance program.  Test performance has been validated by [facility] for patients greater than or equal to 28 year old. It is not intended to diagnose infection nor to guide or monitor treatment.   Urine culture     Status: None   Collection Time: 04/23/15 10:35 PM  Result Value Ref Range Status   Specimen Description URINE, RANDOM  Final   Special Requests NONE  Final   Culture NO GROWTH 1 DAY  Final   Report Status 04/25/2015 FINAL  Final  Culture, Urine     Status: None   Collection Time: 04/24/15 12:30 PM  Result Value Ref Range Status   Specimen Description URINE, RANDOM  Final   Special Requests NONE  Final   Culture NO GROWTH 1 DAY  Final   Report Status 04/25/2015 FINAL  Final    Radiology Reports Dg Chest 2 View  04/24/2015   CLINICAL DATA:  59 year old male with acute renal failure. Preop radiograph.  EXAM: CHEST  2 VIEW  COMPARISON:  04/01/2015   FINDINGS: The heart size and mediastinal contours are within normal limits. Both lungs are clear. The visualized skeletal structures are unremarkable.  IMPRESSION: No active cardiopulmonary disease.   Electronically Signed   By: Anner Crete M.D.   On: 04/24/2015 01:36   Dg Chest 2 View  04/01/2015   CLINICAL DATA:  Hypertension, smoker.  EXAM: CHEST  2 VIEW  COMPARISON:  09/24/2011 and 12/23/2009  FINDINGS: Lungs are adequately inflated without focal consolidation or effusion. Nodule opacity over  the posterior lung bases on the lateral film not well seen on the film. Cardiomediastinal silhouette and remainder of the exam is unchanged.  IMPRESSION: Nodule opacity projected over the spine in the lung bases on the lateral film. Recommend noncontrast chest CT to exclude pulmonary parenchymal nodule.   Electronically Signed   By: Marin Olp M.D.   On: 04/01/2015 13:15   US Abdomen Complete  04/24/2015   CLINICAL DATA:  Acute renal failure  EXAM: ULTRASOUND ABDOMEN COMPLETE  COMPARISON:  None.  FINDINGS: Gallbladder: Gallbladder is full and there are internal mid level echoes layering. No definitive, shadowing stone. No wall thickening or focal tenderness.  Common bile duct: Diameter: 4 mm. Where visualized, no filling defect.  Liver: No focal lesion identified. Within normal limits in parenchymal echogenicity. Antegrade flow in the imaged portal venous system.  IVC: No abnormality visualized.  Pancreas: Visualized portion unremarkable.  Spleen: Size and appearance within normal limits.  Right Kidney: Length: 11 cm. Echogenicity within normal limits. No mass or hydronephrosis visualized.  Left Kidney: Length: 12 cm. Echogenicity within normal limits. No mass or hydronephrosis visualized.  Abdominal aorta: Scattered atherosclerotic wall calcification. No visualized aneurysm.  IMPRESSION: 1. No hydronephrosis. 2. Gallbladder sludge   Electronically Signed   By: Monte Fantasia M.D.   On: 04/24/2015 15:15      CBC  Recent Labs Lab 04/23/15 1512 04/23/15 2217 04/24/15 0436 04/25/15 0512  WBC 15.7* 14.2* 12.5* 5.4  HGB 18.4* 17.5* 15.8 13.9  HCT 52.1* 49.2 45.0 40.9  PLT 149* 160 159 160  MCV 95.4 94.3 94.5 96.9  MCH 33.7 33.5 33.2 32.9  MCHC 35.3 35.6 35.1 34.0  RDW 13.3 13.2 13.3 13.6  LYMPHSABS 2.2 2.4  --   --   MONOABS 1.6* 1.4*  --   --   EOSABS 0.8* 0.9*  --   --   BASOSABS 0.0 0.0  --   --     Chemistries   Recent Labs Lab 04/23/15 1513 04/23/15 2216 04/24/15 0021 04/24/15 0436 04/25/15 0512  NA 132* 135  --  135 136  K 4.2 3.8  --  3.8 4.6  CL 94* 92*  --  99* 103  CO2 23 29  --  26 24  GLUCOSE 187* 173*  --  158* 194*  BUN 49* 49*  --  48* 58*  CREATININE 3.72* 4.19*  --  4.31* 4.17*  CALCIUM 10.0 9.8  --  8.9 8.8*  MG  --   --  2.5* 2.6*  --   AST 133* 108*  --  100* 69*  ALT 208* 208*  --  182* 167*  ALKPHOS 442* 404*  --  339* 320*  BILITOT 1.2 1.1  --  0.9 0.7   ------------------------------------------------------------------------------------------------------------------ estimated creatinine clearance is 19.8 mL/min (by C-G formula based on Cr of 4.17). ------------------------------------------------------------------------------------------------------------------  Recent Labs  04/24/15 0436  HGBA1C 6.8*   ------------------------------------------------------------------------------------------------------------------ No results for input(s): CHOL, HDL, LDLCALC, TRIG, CHOLHDL, LDLDIRECT in the last 72 hours. ------------------------------------------------------------------------------------------------------------------  Recent Labs  04/24/15 0436  TSH 0.828   ------------------------------------------------------------------------------------------------------------------ No results for input(s): VITAMINB12, FOLATE, FERRITIN, TIBC, IRON, RETICCTPCT in the last 72 hours.  Coagulation profile  Recent Labs Lab 04/23/15 1512  04/24/15 0021  INR 1.10 1.06    No results for input(s): DDIMER in the last 72 hours.  Cardiac Enzymes No results for input(s): CKMB, TROPONINI, MYOGLOBIN in the last 168 hours.  Invalid input(s): CK ------------------------------------------------------------------------------------------------------------------ Invalid input(s): POCBNP  Time Spent in minutes   30 minutes   ELGERGAWY, DAWOOD M.D on 04/25/2015 at 4:52 PM  Between 7am to 7pm - Pager - [phone]  After 7pm go to [email or web address] - password [facility]  [facility]   Office  [phone]

## 2015-04-26 LAB — COMPREHENSIVE METABOLIC PANEL
ALBUMIN: 2.9 g/dL — AB (ref 3.5–5.0)
ALT: 168 U/L — AB (ref 17–63)
AST: 71 U/L — AB (ref 15–41)
Alkaline Phosphatase: 284 U/L — ABNORMAL HIGH (ref 38–126)
Anion gap: 8 (ref 5–15)
BUN: 60 mg/dL — AB (ref 6–20)
CHLORIDE: 106 mmol/L (ref 101–111)
CO2: 24 mmol/L (ref 22–32)
CREATININE: 2.92 mg/dL — AB (ref 0.61–1.24)
Calcium: 9.1 mg/dL (ref 8.9–10.3)
GFR calc Af Amer: 25 mL/min — ABNORMAL LOW (ref >60)
GFR, EST NON AFRICAN AMERICAN: 22 mL/min — AB (ref >60)
GLUCOSE: 122 mg/dL — AB (ref 65–99)
POTASSIUM: 4.2 mmol/L (ref 3.5–5.1)
SODIUM: 138 mmol/L (ref 135–145)
Total Bilirubin: 0.4 mg/dL (ref 0.3–1.2)
Total Protein: 6 g/dL — ABNORMAL LOW (ref 6.5–8.1)

## 2015-04-26 LAB — GLUCOSE, CAPILLARY
GLUCOSE-CAPILLARY: 126 mg/dL — AB (ref 65–99)
GLUCOSE-CAPILLARY: 145 mg/dL — AB (ref 65–99)
Glucose-Capillary: 187 mg/dL — ABNORMAL HIGH (ref 65–99)
Glucose-Capillary: 99 mg/dL (ref 65–99)

## 2015-04-26 MED ORDER — METOPROLOL TARTRATE 25 MG PO TABS
25.0000 mg | ORAL_TABLET | Freq: Two times a day (BID) | ORAL | Status: DC
  Administered 2015-04-26 – 2015-04-28 (×4): 25 mg via ORAL
  Filled 2015-04-26 (×2): qty 2
  Filled 2015-04-26 (×2): qty 1

## 2015-04-26 MED ORDER — HYDRALAZINE HCL 50 MG PO TABS
75.0000 mg | ORAL_TABLET | Freq: Three times a day (TID) | ORAL | Status: DC
  Administered 2015-04-26 – 2015-04-27 (×3): 75 mg via ORAL
  Filled 2015-04-26 (×6): qty 1

## 2015-04-26 NOTE — Progress Notes (Signed)
Pt foley dc'd per order with no complication. Pt tolerated well. Voided 250 cc immediately after. Will continue to monitor.   Tyna Jaksch, RN

## 2015-04-26 NOTE — Progress Notes (Signed)
[facility] Progress Note    Assessment/ Plan:   1. Acute renal failure - Likely secondary volume depletion in the setting of ARB and diuretic use. Creatinine peaked at 4.31 on 04/24/2015 from admission creatinine of 3.72 with baseline of 1.0. He has been receiving NS at 158ml/hr. Creatinine down to 2.9 and 4.9L of urine output. Continue management per primary team. No acute HD needs. Can consider voiding trial.   Please call if any questions arise.  Subjective:   No acute events overnight. Denies SOB. Doing well.    Objective:   BP 155/78 mmHg  Pulse 62  Temp(Src) 98.2 F (36.8 C) (Oral)  Resp 18  Ht 6' (1.829 m)  Wt 172 lb (78.019 kg)  BMI 23.32 kg/m2  SpO2 97%  Intake/Output Summary (Last 24 hours) at 04/26/15 1150 Last data filed at 04/26/15 9518  Gross per 24 hour  Intake   2586 ml  Output   4790 ml  Net  -2204 ml   Weight change: 6 lb 1.6 oz (2.767 kg)  Physical Exam: Gen: NAD CVS: RRR Resp: clear bilaterally Abd: soft, nontender Ext: no edema  Imaging: US Abdomen Complete  04/24/2015   CLINICAL DATA:  Acute renal failure  EXAM: ULTRASOUND ABDOMEN COMPLETE  COMPARISON:  None.  FINDINGS: Gallbladder: Gallbladder is full and there are internal mid level echoes layering. No definitive, shadowing stone. No wall thickening or focal tenderness.  Common bile duct: Diameter: 4 mm. Where visualized, no filling defect.  Liver: No focal lesion identified. Within normal limits in parenchymal echogenicity. Antegrade flow in the imaged portal venous system.  IVC: No abnormality visualized.  Pancreas: Visualized portion unremarkable.  Spleen: Size and appearance within normal limits.  Right Kidney: Length: 11 cm. Echogenicity within normal limits. No mass or hydronephrosis visualized.  Left Kidney: Length: 12 cm. Echogenicity within normal limits. No mass or hydronephrosis visualized.  Abdominal aorta: Scattered atherosclerotic wall calcification. No visualized  aneurysm.  IMPRESSION: 1. No hydronephrosis. 2. Gallbladder sludge   Electronically Signed   By: Monte Fantasia M.D.   On: 04/24/2015 15:15    Labs: BMET  Recent Labs Lab 04/23/15 1513 04/23/15 2216 04/24/15 0021 04/24/15 0436 04/25/15 0512 04/26/15 0700  NA 132* 135  --  135 136 138  K 4.2 3.8  --  3.8 4.6 4.2  CL 94* 92*  --  99* 103 106  CO2 23 29  --  26 24 24   GLUCOSE 841* 173*  --  158* 194* 122*  BUN 49* 49*  --  48* 58* 60*  CREATININE 3.72* 4.19*  --  4.31* 4.17* 2.92*  CALCIUM 10.0 9.8  --  8.9 8.8* 9.1  PHOS  --   --  3.7 4.3  --   --    CBC  Recent Labs Lab 04/23/15 1512 04/23/15 2217 04/24/15 0436 04/25/15 0512  WBC 15.7* 14.2* 12.5* 5.4  NEUTROABS 11.1* 9.5*  --   --   HGB 18.4* 17.5* 15.8 13.9  HCT 52.1* 49.2 45.0 40.9  MCV 95.4 94.3 94.5 96.9  PLT 149* 160 159 160    Medications:    . cloNIDine  0.2 mg Oral BID  . docusate sodium  100 mg Oral BID  . feeding supplement (GLUCERNA SHAKE)  237 mL Oral BID BM  . folic acid  1 mg Oral Daily  . heparin  5,000 Units Subcutaneous 3 times per day  . hydrALAZINE  50 mg Oral TID  . insulin aspart  0-5  Units Subcutaneous QHS  . insulin aspart  0-9 Units Subcutaneous TID WC  . LORazepam  0-4 mg Intravenous Q6H   Followed by  . LORazepam  0-4 mg Intravenous Q12H  . metoprolol  50 mg Oral BID  . multivitamin with minerals  1 tablet Oral Daily  . senna  1 tablet Oral BID  . sodium chloride  3 mL Intravenous Q12H  . tamsulosin  0.4 mg Oral QPC supper  . thiamine  100 mg Oral Daily   Or  . thiamine  100 mg Intravenous Daily   Jacques Earthly, MD  Internal Medicine PGY-2  04/26/2015, 11:50 AM

## 2015-04-26 NOTE — Evaluation (Signed)
Physical Therapy Evaluation Patient Details Name: Keith [name] MRN: [id number] DOB: May 04, 1956 Today's Date: 04/26/2015   History of Present Illness  On 04-23-15 pt was undergoing pre-op workup for back surgery which was scheduled for 04-25-15. Lab results returned with abnormal values and pt was advised to report to the ED. Pt was admitted for acute renal failure. PMH consists of HTN, DM without complications, and ETOH abuse.  Clinical Impression  Pt admitted with above diagnosis. Pt currently with functional limitations due to the deficits listed below (see PT Problem List). Eval was very pt driven to assist with maintaining decreased agitation. Pt reporting 8/10 pain with mobility, limiting gait distance to 10 feet. Pt agreeable to OOB in recliner for lunch. Pt will benefit from skilled PT to increase their independence and safety with mobility to allow discharge to the venue listed below.       Follow Up Recommendations Home health PT;Supervision/Assistance - 24 hour    Equipment Recommendations  Other (comment) (TBD, if pt willing to use)    Recommendations for Other Services       Precautions / Restrictions Precautions Precautions: Fall;Other (comment) Precaution Comments: ETOH withdrawal      Mobility  Bed Mobility Overal bed mobility: Needs Assistance Bed Mobility: Supine to Sit     Supine to sit: Supervision;HOB elevated     General bed mobility comments: use of rails  Transfers Overall transfer level: Needs assistance Equipment used: None Transfers: Sit to/from [facility] Sit to Stand: Min guard Stand pivot transfers: Min guard          Ambulation/Gait Ambulation/Gait assistance: Min guard Ambulation Distance (Feet): 10 Feet Assistive device: None Gait Pattern/deviations: Step-through pattern;Decreased stride length Gait velocity: decreased   General Gait Details: Pt declining use of A.D. He used to IV pole for support. He sat  impulsively on the foot of the bed after ambulating, reporting increased pain.  Stairs            Wheelchair Mobility    Modified Rankin (Stroke Patients Only)       Balance                                             Pertinent Vitals/Pain Pain Assessment: 0-10 Pain Score: 8  Pain Location: R thigh Pain Descriptors / Indicators: Aching;Burning Pain Intervention(s): Limited activity within patient's tolerance;Repositioned    Home Living Family/patient expects to be discharged to:: Private residence Living Arrangements: Spouse/significant other Available Help at Discharge: Family;Available 24 hours/day Type of Home: Mobile home Home Access: Stairs to enter Entrance Stairs-Rails: Psychiatric nurse of Steps: 4 Home Layout: One level Home Equipment: None      Prior Function Level of Independence: Independent         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     Hand Dominance        Extremity/Trunk Assessment   Upper Extremity Assessment: Overall WFL for tasks assessed           Lower Extremity Assessment: Overall WFL for tasks assessed         Communication   Communication: No difficulties  Cognition Arousal/Alertness: Awake/alert Behavior During Therapy: Impulsive Overall Cognitive Status: No family/caregiver present to determine baseline cognitive functioning (mild confusion/agitation)       Memory: Decreased short-term memory  General Comments      Exercises        Assessment/Plan    PT Assessment Patient needs continued PT services  PT Diagnosis Difficulty walking;Acute pain   PT Problem List Decreased strength;Decreased activity tolerance;Decreased balance;Decreased mobility;Decreased knowledge of use of DME;Decreased safety awareness;Pain  PT Treatment Interventions DME instruction;Gait training;Stair training;Functional mobility  training;Therapeutic activities;Therapeutic exercise;Patient/family education;Balance training;Cognitive remediation   PT Goals (Current goals can be found in the Care Plan section) Acute Rehab PT Goals Patient Stated Goal: decrease back pain PT Goal Formulation: With patient Time For Goal Achievement: 05/10/15 Potential to Achieve Goals: Good    Frequency Min 3X/week   Barriers to discharge        Co-evaluation               End of Session Equipment Utilized During Treatment: Gait belt Activity Tolerance: Patient limited by pain Patient left: in chair;with call bell/phone within reach Nurse Communication: Mobility status         Time: 1123-1140 PT Time Calculation (min) (ACUTE ONLY): 17 min   Charges:   PT Evaluation $Initial PT Evaluation Tier I: 1 Procedure     PT G CodesLorriane Shire 04/26/2015, 12:39 PM

## 2015-04-26 NOTE — Progress Notes (Signed)
Patient Demographics  Draken Farrior, is a 59 y.o. male, DOB - [birth year], ZDG:[id number]  Admit date - 04/23/2015   Admitting Physician Toy Baker, MD  Outpatient Primary MD for the patient is [name], MD  LOS - 2   Chief Complaint  Patient presents with  . abnormal labs          Subjective:   K[place]oa Fyock today has, No headache, No chest pain, No abdominal pain - No Nausea, No Cough - SOB. No complaints today.  Assessment & Plan    Active Problems:   Hyperlipidemia   Essential hypertension   Lumbar radiculopathy   Acute renal failure   Elevated LFTs   Alcohol abuse   DM (diabetes mellitus) type II controlled with renal manifestation   Protein-calorie malnutrition, severe  Acute renal failure - Appears to be multifactorial, prerenal azotemia from volume depletion secondary to decreased oral intake, as well as evidence of obstructive uropathy as patient had 900 mL on bladder scan. - Foley catheter inserted - Ultrasound abdomen with no evidence of hydronephrosis - Continue to hold hydrochlorothiazide and losartan - Nephrology consult appreciated, continue to improve on IV fluids, creatinine is 2.9 today. - Nephrology signed off  Urinary retention - Started on Flomax, will attempt a voiding trial today.  Elevated LFTs - Trending down,follow on  Abdominal ultrasound significant for gallbladder sludge, no evidence of cholecystitis, no right upper quadrant pain. - Negative hepatitis panel  Hypertension -acceptable blood pressure, continue with clonidine, increase metoprolol dose given heartrate on the lower side, increase hydralazine meanwhile.   hyperlipidemia  - Avoid statin giving elevated LFTs   Lumbar radiculopathy - When necessary pain medication  Alcohol abuse - he was counseled, reports last drink was 8 daysprevertebral admission, continue to monitor on  CIWA protocol  Diabetes mellitus -CBGs acceptable  with insulin sliding scale  Skin hives - benadryl as needed - Resolved  Code Status: Full  Family Communication: none at bedside   Disposition Plan: Home with renal function at baseline   Procedures  None   Consults   None   Medications  Scheduled Meds: . cloNIDine  0.2 mg Oral BID  . docusate sodium  100 mg Oral BID  . feeding supplement (GLUCERNA SHAKE)  237 mL Oral BID BM  . folic acid  1 mg Oral Daily  . heparin  5,000 Units Subcutaneous 3 times per day  . hydrALAZINE  50 mg Oral TID  . insulin aspart  0-5 Units Subcutaneous QHS  . insulin aspart  0-9 Units Subcutaneous TID WC  . LORazepam  0-4 mg Intravenous Q12H  . metoprolol  50 mg Oral BID  . multivitamin with minerals  1 tablet Oral Daily  . senna  1 tablet Oral BID  . sodium chloride  3 mL Intravenous Q12H  . tamsulosin  0.4 mg Oral QPC supper  . thiamine  100 mg Oral Daily   Or  . thiamine  100 mg Intravenous Daily   Continuous Infusions: . sodium chloride 100 mL/hr at 04/26/15 0553   PRN Meds:.acetaminophen **OR** acetaminophen, bisacodyl, diphenhydrAMINE, HYDROmorphone (DILAUDID) injection, LORazepam **OR** LORazepam, methocarbamol, ondansetron **OR** ondansetron (ZOFRAN) IV, oxyCODONE, polyethylene glycol  DVT Prophylaxis  Lovenox -   Lab Results  Component  Value Date   PLT 160 04/25/2015    Antibiotics    Anti-infectives    None          Objective:   Filed Vitals:   04/25/15 1737 04/25/15 2116 04/26/15 0519 04/26/15 0801  BP: 146/66 156/75 149/68 155/78  Pulse: 58 63 53 62  Temp: 97.9 F (36.6 C) 98.6 F (37 C) 97.6 F (36.4 C) 98.2 F (36.8 C)  TempSrc: Oral Oral Oral Oral  Resp: 19 18 18 18   Height:      Weight:  78.019 kg (172 lb)    SpO2: 98% 97% 98% 97%    Wt Readings from Last 3 Encounters:  04/25/15 78.019 kg (172 lb)  04/23/15 75.297 kg (166 lb)  04/01/15 79.833 kg (176 lb)     Intake/Output Summary  (Last 24 hours) at 04/26/15 1258 Last data filed at 04/26/15 0904  Gross per 24 hour  Intake   2586 ml  Output   4790 ml  Net  -2204 ml     Physical Exam  Awake Alert, Oriented X 3, No new F.N deficits, Normal affect [place].AT,PERRAL Supple Neck,No JVD, No cervical lymphadenopathy appriciated.  Symmetrical Chest wall movement, Good air movement bilaterally, CTAB RRR,No Gallops,Rubs or new Murmurs, No Parasternal Heave +ve B.Sounds, Abd Soft, No tenderness, No organomegaly appriciated, No rebound - guarding or rigidity. No Cyanosis, Clubbing or edema,   hives resolved   Data Review   Micro Results Recent Results (from the past 240 hour(s))  Surgical pcr screen     Status: None   Collection Time: 04/23/15  3:12 PM  Result Value Ref Range Status   MRSA, PCR NEGATIVE NEGATIVE Final   Staphylococcus aureus NEGATIVE NEGATIVE Final    Comment:        The Xpert SA Assay (FDA approved for NASAL specimens in patients over 73 years of age), is one component of a comprehensive surveillance program.  Test performance has been validated by [facility] for patients greater than or equal to 62 year old. It is not intended to diagnose infection nor to guide or monitor treatment.   Urine culture     Status: None   Collection Time: 04/23/15 10:35 PM  Result Value Ref Range Status   Specimen Description URINE, RANDOM  Final   Special Requests NONE  Final   Culture NO GROWTH 1 DAY  Final   Report Status 04/25/2015 FINAL  Final  Culture, Urine     Status: None   Collection Time: 04/24/15 12:30 PM  Result Value Ref Range Status   Specimen Description URINE, RANDOM  Final   Special Requests NONE  Final   Culture NO GROWTH 1 DAY  Final   Report Status 04/25/2015 FINAL  Final    Radiology Reports Dg Chest 2 View  04/24/2015   CLINICAL DATA:  59 year old male with acute renal failure. Preop radiograph.  EXAM: CHEST  2 VIEW  COMPARISON:  04/01/2015  FINDINGS: The heart size and  mediastinal contours are within normal limits. Both lungs are clear. The visualized skeletal structures are unremarkable.  IMPRESSION: No active cardiopulmonary disease.   Electronically Signed   By: Anner Crete M.D.   On: 04/24/2015 01:36   Dg Chest 2 View  04/01/2015   CLINICAL DATA:  Hypertension, smoker.  EXAM: CHEST  2 VIEW  COMPARISON:  09/24/2011 and 12/23/2009  FINDINGS: Lungs are adequately inflated without focal consolidation or effusion. Nodule opacity over the posterior lung bases on the lateral film not  well seen on the film. Cardiomediastinal silhouette and remainder of the exam is unchanged.  IMPRESSION: Nodule opacity projected over the spine in the lung bases on the lateral film. Recommend noncontrast chest CT to exclude pulmonary parenchymal nodule.   Electronically Signed   By: Marin Olp M.D.   On: 04/01/2015 13:15   US Abdomen Complete  04/24/2015   CLINICAL DATA:  Acute renal failure  EXAM: ULTRASOUND ABDOMEN COMPLETE  COMPARISON:  None.  FINDINGS: Gallbladder: Gallbladder is full and there are internal mid level echoes layering. No definitive, shadowing stone. No wall thickening or focal tenderness.  Common bile duct: Diameter: 4 mm. Where visualized, no filling defect.  Liver: No focal lesion identified. Within normal limits in parenchymal echogenicity. Antegrade flow in the imaged portal venous system.  IVC: No abnormality visualized.  Pancreas: Visualized portion unremarkable.  Spleen: Size and appearance within normal limits.  Right Kidney: Length: 11 cm. Echogenicity within normal limits. No mass or hydronephrosis visualized.  Left Kidney: Length: 12 cm. Echogenicity within normal limits. No mass or hydronephrosis visualized.  Abdominal aorta: Scattered atherosclerotic wall calcification. No visualized aneurysm.  IMPRESSION: 1. No hydronephrosis. 2. Gallbladder sludge   Electronically Signed   By: Monte Fantasia M.D.   On: 04/24/2015 15:15     CBC  Recent Labs Lab  04/23/15 1512 04/23/15 2217 04/24/15 0436 04/25/15 0512  WBC 15.7* 14.2* 12.5* 5.4  HGB 18.4* 17.5* 15.8 13.9  HCT 52.1* 49.2 45.0 40.9  PLT 149* 160 159 160  MCV 95.4 94.3 94.5 96.9  MCH 33.7 33.5 33.2 32.9  MCHC 35.3 35.6 35.1 34.0  RDW 13.3 13.2 13.3 13.6  LYMPHSABS 2.2 2.4  --   --   MONOABS 1.6* 1.4*  --   --   EOSABS 0.8* 0.9*  --   --   BASOSABS 0.0 0.0  --   --     Chemistries   Recent Labs Lab 04/23/15 1513 04/23/15 2216 04/24/15 0021 04/24/15 0436 04/25/15 0512 04/26/15 0700  NA 132* 135  --  135 136 138  K 4.2 3.8  --  3.8 4.6 4.2  CL 94* 92*  --  99* 103 106  CO2 23 29  --  26 24 24   GLUCOSE 187* 173*  --  158* 194* 122*  BUN 49* 49*  --  48* 58* 60*  CREATININE 3.72* 4.19*  --  4.31* 4.17* 2.92*  CALCIUM 10.0 9.8  --  8.9 8.8* 9.1  MG  --   --  2.5* 2.6*  --   --   AST 133* 108*  --  100* 69* 71*  ALT 208* 208*  --  182* 167* 168*  ALKPHOS 442* 404*  --  339* 320* 284*  BILITOT 1.2 1.1  --  0.9 0.7 0.4   ------------------------------------------------------------------------------------------------------------------ estimated creatinine clearance is 29.2 mL/min (by C-G formula based on Cr of 2.92). ------------------------------------------------------------------------------------------------------------------  Recent Labs  04/24/15 0436  HGBA1C 6.8*   ------------------------------------------------------------------------------------------------------------------ No results for input(s): CHOL, HDL, LDLCALC, TRIG, CHOLHDL, LDLDIRECT in the last 72 hours. ------------------------------------------------------------------------------------------------------------------  Recent Labs  04/24/15 0436  TSH 0.828   ------------------------------------------------------------------------------------------------------------------ No results for input(s): VITAMINB12, FOLATE, FERRITIN, TIBC, IRON, RETICCTPCT in the last 72 hours.  Coagulation  profile  Recent Labs Lab 04/23/15 1512 04/24/15 0021  INR 1.10 1.06    No results for input(s): DDIMER in the last 72 hours.  Cardiac Enzymes No results for input(s): CKMB, TROPONINI, MYOGLOBIN in the last 168 hours.  Invalid input(s):  CK ------------------------------------------------------------------------------------------------------------------ Invalid input(s): POCBNP     Time Spent in minutes   30 minutes   Darchelle Nunes M.D on 04/26/2015 at 12:58 PM  Between 7am to 7pm - Pager - [phone]  After 7pm go to [email or web address] - password [facility]  [facility]   Office  [phone]

## 2015-04-27 LAB — COMPREHENSIVE METABOLIC PANEL
ALK PHOS: 242 U/L — AB (ref 38–126)
ALT: 144 U/L — AB (ref 17–63)
AST: 59 U/L — ABNORMAL HIGH (ref 15–41)
Albumin: 2.7 g/dL — ABNORMAL LOW (ref 3.5–5.0)
Anion gap: 6 (ref 5–15)
BILIRUBIN TOTAL: 1 mg/dL (ref 0.3–1.2)
BUN: 41 mg/dL — ABNORMAL HIGH (ref 6–20)
CALCIUM: 9.4 mg/dL (ref 8.9–10.3)
CO2: 25 mmol/L (ref 22–32)
CREATININE: 2.02 mg/dL — AB (ref 0.61–1.24)
Chloride: 112 mmol/L — ABNORMAL HIGH (ref 101–111)
GFR calc non Af Amer: 34 mL/min — ABNORMAL LOW (ref >60)
GFR, EST AFRICAN AMERICAN: 39 mL/min — AB (ref >60)
GLUCOSE: 125 mg/dL — AB (ref 65–99)
Potassium: 4.9 mmol/L (ref 3.5–5.1)
SODIUM: 143 mmol/L (ref 135–145)
Total Protein: 5.6 g/dL — ABNORMAL LOW (ref 6.5–8.1)

## 2015-04-27 LAB — GLUCOSE, CAPILLARY
GLUCOSE-CAPILLARY: 125 mg/dL — AB (ref 65–99)
GLUCOSE-CAPILLARY: 117 mg/dL — AB (ref 65–99)
Glucose-Capillary: 190 mg/dL — ABNORMAL HIGH (ref 65–99)
Glucose-Capillary: 109 mg/dL — ABNORMAL HIGH (ref 65–99)

## 2015-04-27 MED ORDER — DIPHENHYDRAMINE HCL 25 MG PO CAPS: 25.0000 mg | ORAL_CAPSULE | Freq: Once | ORAL | Status: DC

## 2015-04-27 MED ORDER — HYDRALAZINE HCL 50 MG PO TABS
100.0000 mg | ORAL_TABLET | Freq: Three times a day (TID) | ORAL | Status: DC
  Administered 2015-04-27 – 2015-04-28 (×3): 100 mg via ORAL
  Filled 2015-04-27 (×3): qty 2

## 2015-04-27 NOTE — Progress Notes (Signed)
Patient Demographics  Keith [name], is a 59 y.o. male, DOB - 08-28-1955, KGY:[id number]  Admit date - 04/23/2015   Admitting Physician Toy Baker, MD  Outpatient Primary MD for the patient is [name], MD  LOS - 3   Chief Complaint  Patient presents with  . abnormal labs          Subjective:   Keith [name] today has, No headache, No chest pain, No abdominal pain - No Nausea, No Cough - SOB. No complaints today.  Assessment & Plan    Active Problems:   Hyperlipidemia   Essential hypertension   Lumbar radiculopathy   Acute renal failure   Elevated LFTs   Alcohol abuse   DM (diabetes mellitus) type II controlled with renal manifestation   Protein-calorie malnutrition, severe  Acute renal failure - Appears to be multifactorial, prerenal azotemia from volume depletion secondary to decreased oral intake, as well as evidence of obstructive uropathy as patient had 900 mL on bladder scan. - Ultrasound abdomen with no evidence of hydronephrosis - Continue to hold hydrochlorothiazide and losartan - Nephrology consult appreciated, continue to improve on IV fluids, creatinine is 2  today. Hopefully will be able to discharge tomorrow if creatinine continues to trend down. - Nephrology signed off  Urinary retention - Started on Flomax, catheter insertion, discontinued 9/17, no recurrence  Elevated LFTs - Trending down, Abdominal ultrasound significant for gallbladder sludge, no evidence of cholecystitis, no right upper quadrant pain. - Negative hepatitis panel  Hypertension -acceptable blood pressure, continue with clonidine, decreased metoprolol dose given heartrate on the lower side, increased hydralazine meanwhile.   hyperlipidemia  - Avoid statin giving elevated LFTs   Lumbar radiculopathy - When necessary pain medication  Alcohol abuse - he was counseled, reports last  drink was 8 daysprevertebral admission, continue to monitor on CIWA protocol  Diabetes mellitus -CBGs acceptable  with insulin sliding scale  Skin hives - benadryl as needed - Resolved  Code Status: Full  Family Communication: none at bedside   Disposition Plan: Home with renal function at baseline   Procedures  None   Consults   None   Medications  Scheduled Meds: . cloNIDine  0.2 mg Oral BID  . docusate sodium  100 mg Oral BID  . feeding supplement (GLUCERNA SHAKE)  237 mL Oral BID BM  . folic acid  1 mg Oral Daily  . heparin  5,000 Units Subcutaneous 3 times per day  . hydrALAZINE  75 mg Oral TID  . insulin aspart  0-5 Units Subcutaneous QHS  . insulin aspart  0-9 Units Subcutaneous TID WC  . LORazepam  0-4 mg Intravenous Q12H  . metoprolol  25 mg Oral BID  . multivitamin with minerals  1 tablet Oral Daily  . senna  1 tablet Oral BID  . sodium chloride  3 mL Intravenous Q12H  . tamsulosin  0.4 mg Oral QPC supper  . thiamine  100 mg Oral Daily   Or  . thiamine  100 mg Intravenous Daily   Continuous Infusions: . sodium chloride 100 mL/hr at 04/27/15 0607   PRN Meds:.acetaminophen **OR** acetaminophen, bisacodyl, diphenhydrAMINE, HYDROmorphone (DILAUDID) injection, LORazepam **OR** LORazepam, methocarbamol, ondansetron **OR** ondansetron (ZOFRAN) IV, oxyCODONE, polyethylene glycol  DVT Prophylaxis  Lovenox -   Lab Results  Component Value Date   PLT 160 04/25/2015    Antibiotics    Anti-infectives    None          Objective:   Filed Vitals:   04/26/15 1455 04/26/15 2037 04/27/15 0520 04/27/15 0732  BP: 161/78 160/78 164/75 173/78  Pulse: 56 58 62 63  Temp: 97.9 F (36.6 C) 98.5 F (36.9 C) 99 F (37.2 C) 98.9 F (37.2 C)  TempSrc: Oral Oral Oral Oral  Resp: 18 20 18 18   Height:      Weight:      SpO2: 100% 99% 96% 96%    Wt Readings from Last 3 Encounters:  04/25/15 78.019 kg (172 lb)  04/23/15 75.297 kg (166 lb)  04/01/15 79.833  kg (176 lb)     Intake/Output Summary (Last 24 hours) at 04/27/15 1400 Last data filed at 04/27/15 1030  Gross per 24 hour  Intake   3462 ml  Output   2526 ml  Net    936 ml     Physical Exam  Awake Alert, Oriented X 3, No new F.N deficits, Normal affect [place].AT,PERRAL Supple Neck,No JVD, No cervical lymphadenopathy appriciated.  Symmetrical Chest wall movement, Good air movement bilaterally, CTAB RRR,No Gallops,Rubs or new Murmurs, No Parasternal Heave +ve B.Sounds, Abd Soft, No tenderness, No organomegaly appriciated, No rebound - guarding or rigidity. No Cyanosis, Clubbing or edema,   hives resolved   Data Review   Micro Results Recent Results (from the past 240 hour(s))  Surgical pcr screen     Status: None   Collection Time: 04/23/15  3:12 PM  Result Value Ref Range Status   MRSA, PCR NEGATIVE NEGATIVE Final   Staphylococcus aureus NEGATIVE NEGATIVE Final    Comment:        The Xpert SA Assay (FDA approved for NASAL specimens in patients over 74 years of age), is one component of a comprehensive surveillance program.  Test performance has been validated by [facility] for patients greater than or equal to 6 year old. It is not intended to diagnose infection nor to guide or monitor treatment.   Urine culture     Status: None   Collection Time: 04/23/15 10:35 PM  Result Value Ref Range Status   Specimen Description URINE, RANDOM  Final   Special Requests NONE  Final   Culture NO GROWTH 1 DAY  Final   Report Status 04/25/2015 FINAL  Final  Culture, Urine     Status: None   Collection Time: 04/24/15 12:30 PM  Result Value Ref Range Status   Specimen Description URINE, RANDOM  Final   Special Requests NONE  Final   Culture NO GROWTH 1 DAY  Final   Report Status 04/25/2015 FINAL  Final    Radiology Reports Dg Chest 2 View  04/24/2015   CLINICAL DATA:  59 year old male with acute renal failure. Preop radiograph.  EXAM: CHEST  2 VIEW  COMPARISON:   04/01/2015  FINDINGS: The heart size and mediastinal contours are within normal limits. Both lungs are clear. The visualized skeletal structures are unremarkable.  IMPRESSION: No active cardiopulmonary disease.   Electronically Signed   By: Anner Crete M.D.   On: 04/24/2015 01:36   Dg Chest 2 View  04/01/2015   CLINICAL DATA:  Hypertension, smoker.  EXAM: CHEST  2 VIEW  COMPARISON:  09/24/2011 and 12/23/2009  FINDINGS: Lungs are adequately inflated without focal consolidation or effusion. Nodule opacity over the posterior  lung bases on the lateral film not well seen on the film. Cardiomediastinal silhouette and remainder of the exam is unchanged.  IMPRESSION: Nodule opacity projected over the spine in the lung bases on the lateral film. Recommend noncontrast chest CT to exclude pulmonary parenchymal nodule.   Electronically Signed   By: Marin Olp M.D.   On: 04/01/2015 13:15   US Abdomen Complete  04/24/2015   CLINICAL DATA:  Acute renal failure  EXAM: ULTRASOUND ABDOMEN COMPLETE  COMPARISON:  None.  FINDINGS: Gallbladder: Gallbladder is full and there are internal mid level echoes layering. No definitive, shadowing stone. No wall thickening or focal tenderness.  Common bile duct: Diameter: 4 mm. Where visualized, no filling defect.  Liver: No focal lesion identified. Within normal limits in parenchymal echogenicity. Antegrade flow in the imaged portal venous system.  IVC: No abnormality visualized.  Pancreas: Visualized portion unremarkable.  Spleen: Size and appearance within normal limits.  Right Kidney: Length: 11 cm. Echogenicity within normal limits. No mass or hydronephrosis visualized.  Left Kidney: Length: 12 cm. Echogenicity within normal limits. No mass or hydronephrosis visualized.  Abdominal aorta: Scattered atherosclerotic wall calcification. No visualized aneurysm.  IMPRESSION: 1. No hydronephrosis. 2. Gallbladder sludge   Electronically Signed   By: Monte Fantasia M.D.   On: 04/24/2015  15:15     CBC  Recent Labs Lab 04/23/15 1512 04/23/15 2217 04/24/15 0436 04/25/15 0512  WBC 15.7* 14.2* 12.5* 5.4  HGB 18.4* 17.5* 15.8 13.9  HCT 52.1* 49.2 45.0 40.9  PLT 149* 160 159 160  MCV 95.4 94.3 94.5 96.9  MCH 33.7 33.5 33.2 32.9  MCHC 35.3 35.6 35.1 34.0  RDW 13.3 13.2 13.3 13.6  LYMPHSABS 2.2 2.4  --   --   MONOABS 1.6* 1.4*  --   --   EOSABS 0.8* 0.9*  --   --   BASOSABS 0.0 0.0  --   --     Chemistries   Recent Labs Lab 04/23/15 2216 04/24/15 0021 04/24/15 0436 04/25/15 0512 04/26/15 0700 04/27/15 0530  NA 135  --  135 136 138 143  K 3.8  --  3.8 4.6 4.2 4.9  CL 92*  --  99* 103 106 112*  CO2 29  --  26 24 24 25   GLUCOSE 173*  --  158* 194* 122* 125*  BUN 49*  --  48* 58* 60* 41*  CREATININE 4.19*  --  4.31* 4.17* 2.92* 2.02*  CALCIUM 9.8  --  8.9 8.8* 9.1 9.4  MG  --  2.5* 2.6*  --   --   --   AST 108*  --  100* 69* 71* 59*  ALT 208*  --  182* 167* 168* 144*  ALKPHOS 404*  --  339* 320* 284* 242*  BILITOT 1.1  --  0.9 0.7 0.4 1.0   ------------------------------------------------------------------------------------------------------------------ estimated creatinine clearance is 42.2 mL/min (by C-G formula based on Cr of 2.02). ------------------------------------------------------------------------------------------------------------------ No results for input(s): HGBA1C in the last 72 hours. ------------------------------------------------------------------------------------------------------------------ No results for input(s): CHOL, HDL, LDLCALC, TRIG, CHOLHDL, LDLDIRECT in the last 72 hours. ------------------------------------------------------------------------------------------------------------------ No results for input(s): TSH, T4TOTAL, T3FREE, THYROIDAB in the last 72 hours.  Invalid input(s): FREET3 ------------------------------------------------------------------------------------------------------------------ No results for  input(s): VITAMINB12, FOLATE, FERRITIN, TIBC, IRON, RETICCTPCT in the last 72 hours.  Coagulation profile  Recent Labs Lab 04/23/15 1512 04/24/15 0021  INR 1.10 1.06    No results for input(s): DDIMER in the last 72 hours.  Cardiac Enzymes No results for  input(s): CKMB, TROPONINI, MYOGLOBIN in the last 168 hours.  Invalid input(s): CK ------------------------------------------------------------------------------------------------------------------ Invalid input(s): POCBNP     Time Spent in minutes   30 minutes   ELGERGAWY, DAWOOD M.D on 04/27/2015 at 2:00 PM  Between 7am to 7pm - Pager - [phone]  After 7pm go to [email or web address] - password [email or web address]  [facility]   Office  [phone]

## 2015-04-28 ENCOUNTER — Telehealth: Payer: Self-pay | Admitting: Emergency Medicine

## 2015-04-28 ENCOUNTER — Other Ambulatory Visit: Payer: Self-pay | Admitting: Internal Medicine

## 2015-04-28 DIAGNOSIS — R7989 Other specified abnormal findings of blood chemistry: Secondary | ICD-10-CM

## 2015-04-28 DIAGNOSIS — N179 Acute kidney failure, unspecified: Secondary | ICD-10-CM

## 2015-04-28 DIAGNOSIS — R945 Abnormal results of liver function studies: Principal | ICD-10-CM

## 2015-04-28 LAB — COMPREHENSIVE METABOLIC PANEL
ALBUMIN: 2.6 g/dL — AB (ref 3.5–5.0)
ALK PHOS: 255 U/L — AB (ref 38–126)
ALT: 170 U/L — ABNORMAL HIGH (ref 17–63)
ANION GAP: 8 (ref 5–15)
AST: 85 U/L — ABNORMAL HIGH (ref 15–41)
BILIRUBIN TOTAL: 0.7 mg/dL (ref 0.3–1.2)
BUN: 24 mg/dL — ABNORMAL HIGH (ref 6–20)
CALCIUM: 8.9 mg/dL (ref 8.9–10.3)
CO2: 25 mmol/L (ref 22–32)
Chloride: 108 mmol/L (ref 101–111)
Creatinine, Ser: 1.44 mg/dL — ABNORMAL HIGH (ref 0.61–1.24)
GFR calc non Af Amer: 51 mL/min — ABNORMAL LOW (ref >60)
GFR, EST AFRICAN AMERICAN: 59 mL/min — AB (ref >60)
GLUCOSE: 110 mg/dL — AB (ref 65–99)
POTASSIUM: 4.3 mmol/L (ref 3.5–5.1)
SODIUM: 141 mmol/L (ref 135–145)
TOTAL PROTEIN: 5.4 g/dL — AB (ref 6.5–8.1)

## 2015-04-28 LAB — GLUCOSE, CAPILLARY
GLUCOSE-CAPILLARY: 163 mg/dL — AB (ref 65–99)
Glucose-Capillary: 127 mg/dL — ABNORMAL HIGH (ref 65–99)

## 2015-04-28 MED ORDER — METOPROLOL TARTRATE 25 MG PO TABS: 25.0000 mg | ORAL_TABLET | Freq: Two times a day (BID) | ORAL | Status: DC

## 2015-04-28 MED ORDER — OXYCODONE-ACETAMINOPHEN 5-325 MG PO TABS: 1.0000 | ORAL_TABLET | ORAL | Status: DC | PRN

## 2015-04-28 MED ORDER — ADULT MULTIVITAMIN W/MINERALS CH: 1.0000 | ORAL_TABLET | Freq: Every day | ORAL | Status: DC

## 2015-04-28 MED ORDER — HYDRALAZINE HCL 100 MG PO TABS: 100.0000 mg | ORAL_TABLET | Freq: Three times a day (TID) | ORAL | Status: DC

## 2015-04-28 MED ORDER — TAMSULOSIN HCL 0.4 MG PO CAPS: 0.4000 mg | ORAL_CAPSULE | Freq: Every day | ORAL | Status: DC

## 2015-04-28 MED ORDER — FOLIC ACID 1 MG PO TABS: 1.0000 mg | ORAL_TABLET | Freq: Every day | ORAL | Status: DC

## 2015-04-28 MED ORDER — THIAMINE HCL 100 MG PO TABS
100.0000 mg | ORAL_TABLET | Freq: Every day | ORAL | Status: DC
Start: 2015-04-28 — End: 2015-05-14

## 2015-04-28 NOTE — Progress Notes (Signed)
Pt discharge instructions given, pt verbalized understanding. VSS. Denies pain at this time. Pt left floor via wheelchair accompanied by staff and family along with walker and 3 in 1 BSC.

## 2015-04-28 NOTE — Telephone Encounter (Signed)
Pt called in stating that he went in for Pre Op clearance and was admitted to the hospital for renal failure. Pts surgery has been cancelled. Pt is wanted to know how soon he needs to have a hospital follow-up appt.   Per discharge summary   On your next visit with your primary care physician please Get Medicines reviewed and adjusted.  Please request your Prim.MD to go over all Hospital Tests and Procedure/Radiological results at the follow up, please get all Hospital records sent to your Prim MD by signing hospital  release before you go home.  Please advise on when pt should be seen.

## 2015-04-28 NOTE — Discharge Instructions (Signed)
Follow with Primary MD [name], MD in 7 days   Get CBC, CMP,  checked  by Primary MD next visit.    Activity: As tolerated with Full fall precautions use walker/cane & assistance as needed   Disposition Home    Diet: Heart Healthy  , with feeding assistance and aspiration precautions.  For Heart failure patients - Check your Weight same time everyday, if you gain over 2 pounds, or you develop in leg swelling, experience more shortness of breath or chest pain, call your Primary MD immediately. Follow Cardiac Low Salt Diet and 1.5 lit/day fluid restriction.   On your next visit with your primary care physician please Get Medicines reviewed and adjusted.   Please request your Prim.MD to go over all Hospital Tests and Procedure/Radiological results at the follow up, please get all Hospital records sent to your Prim MD by signing hospital release before you go home.   If you experience worsening of your admission symptoms, develop shortness of breath, life threatening emergency, suicidal or homicidal thoughts you must seek medical attention immediately by calling 911 or calling your MD immediately  if symptoms less severe.  You Must read complete instructions/literature along with all the possible adverse reactions/side effects for all the Medicines you take and that have been prescribed to you. Take any new Medicines after you have completely understood and accpet all the possible adverse reactions/side effects.   Do not drive, operating heavy machinery, perform activities at heights, swimming or participation in water activities or provide baby sitting services if your were admitted for syncope or siezures until you have seen by Primary MD or a Neurologist and advised to do so again.  Do not drive when taking Pain medications.    Do not take more than prescribed Pain, Sleep and Anxiety Medications  Special Instructions: If you have smoked or chewed Tobacco  in the last 2 yrs  please stop smoking, stop any regular Alcohol  and or any Recreational drug use.  Wear Seat belts while driving.   Please note  You were cared for by a hospitalist during your hospital stay. If you have any questions about your discharge medications or the care you received while you were in the hospital after you are discharged, you can call the unit and asked to speak with the hospitalist on call if the hospitalist that took care of you is not available. Once you are discharged, your primary care physician will handle any further medical issues. Please note that NO REFILLS for any discharge medications will be authorized once you are discharged, as it is imperative that you return to your primary care physician (or establish a relationship with a primary care physician if you do not have one) for your aftercare needs so that they can reassess your need for medications and monitor your lab values.

## 2015-04-28 NOTE — Care Management Note (Addendum)
Case Management Note  Patient Details  Name: Keith [name] MRN: [id number] Date of Birth: April 26, 1956  Subjective/Objective:          CM following for progression and d/c planning.          Action/Plan: Pt for d/c to home DME ordered, pt with back surgery pending.   Expected Discharge Date:      04/28/2015            Expected Discharge Plan:  Home/Self Care  In-House Referral:  NA  Discharge planning Services  CM Consult  Post Acute Care Choice:  Durable Medical Equipment Choice offered to:  Patient  DME Arranged:  Walker rolling, Shower stool DME Agency:  [place]:   HHPT [place] Agency:   [facility] 04/28/2015 2:30 pm per [facility] pt copay for HHPT would be $110 per visit , pt was given this info by [facility] rep and has declined [facility] services at this time.  Status of Service:  Completed   Medicare Important Message Given:    Date Medicare IM Given:    Medicare IM give by:    Date Additional Medicare IM Given:    Additional Medicare Important Message give by:     If discussed at [place] of Stay Meetings, dates discussed:    Additional Comments:  Adron Bene, RN 04/28/2015, 10:21 AM

## 2015-04-28 NOTE — Discharge Summary (Signed)
Keith [name], is a 59 y.o. male  DOB Aug 15, 1955  MRN [id number].  Admission date:  04/23/2015  Admitting Physician  Toy Baker, MD  Discharge Date:  04/28/2015   Primary MD  [name], MD  Recommendations for primary care physician for things to follow:  - Please check labs including a BMP to ensure continued resolution of renal failure. - Antihypotensive medication has been adjusted, please see note below.   Admission Diagnosis  Dehydration [E86.0] Acute renal failure [N17.9] Transaminitis [R74.0] Acute renal failure, unspecified acute renal failure type [N17.9]   Discharge Diagnosis  Dehydration [E86.0] Acute renal failure [N17.9] Transaminitis [R74.0] Acute renal failure, unspecified acute renal failure type [N17.9]    Active Problems:   Hyperlipidemia   Essential hypertension   Lumbar radiculopathy   Acute renal failure   Elevated LFTs   Alcohol abuse   DM (diabetes mellitus) type II controlled with renal manifestation   Protein-calorie malnutrition, severe      Past Medical History  Diagnosis Date  . Hypertension   . Colon polyp   . Diverticulosis   . Tobacco abuse   . Diabetes mellitus without complication     borderline; not treated    Past Surgical History  Procedure Laterality Date  . Wisdom tooth extraction    . 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 Colonoscopy with polypectomy      polyp X 1       History of present illness and  Hospital Course:     Kindly see H&P for history of present illness and admission details, please review complete Labs, Consult reports and Test reports for all details in brief  HPI  from the history and physical done on the day of admission  Keith [name] is a 59 y.o. male   has a past medical history of Hypertension; Colon polyp; Diverticulosis; Tobacco abuse; and Diabetes mellitus without  complication.   Presented with  Patient has history of poorly controlled hypertension as well as radicular Back pain for which she was undergoing preop clearance. The operation was scheduled for 04/25/2015 Patient had his labs checked and was found to have acute onset of renal failure with creatinine up to 4.19 with baseline creatinine being 1.0 in August 2016. Patient appears to be dehydrated and hemoconcentrated with hemoglobin of 17 potassium 3.8 She also was found to have elevated LFTs with AST 108 ALT 208 and elevated alkaline phosphatase is 404. He denies any right upper quadrant pain no nausea no vomiting no diarrhea. Of note patient has history of alcohol abuse usually drinks 6 packs per day. For the past 5 days he stopped drinking in preparation for his back surgery. He denies any symptoms of withdrawal. She reports taking good by mouth. Patient has history of BPH but reports being able to empty properly. He did notice and lately his urine has turned darker and he had had decrease in urine output.  His been taking Percocet for  his back pain. Of note home medications includes losartan hydrochlorothiazide. Patient reports significant back pain he is currently using percocet but have not had any since AM. Deneis any neurological complaints. denies fever or chills. Reports he has lost 6 LB over last 3 months. Reports poor appettite  Hospitalist was called for admission for acute renal failure and LFT elevations  Hospital Course   Acute renal failure - Appears to be multifactorial, prerenal azotemia from volume depletion secondary to decreased oral intake, as well as evidence of obstructive uropathy as patient had 900 mL on bladder scan. - Ultrasound abdomen with no evidence of hydronephrosis - Continue to hold hydrochlorothiazide and losartan on discharge - Continues to improve with IV fluids, creatinine is 1.44 on discharge.  Urinary retention - Started on Flomax, catheter insertion,  discontinued 9/17, no recurrence of retention.  Elevated LFTs - Trending down, Abdominal ultrasound significant for gallbladder sludge, no evidence of cholecystitis, no right upper quadrant pain. - Negative hepatitis panel  Hypertension -acceptable blood pressure, continue with clonidine, decreased metoprolol dose given heartrate on the lower side, started on hydralazine as well during hospital stay. - Continue to hold hydrochlorothiazide and losartan on discharge given he was admitted with acute renal failure.  hyperlipidemia  - Avoid statin giving elevated LFTs   Lumbar radiculopathy - When necessary pain medication  Alcohol abuse - he was counseled, reports last drink was 8 days prior to  admission, was on CIWA protocol during hospital stay  Diabetes mellitus -CBGs acceptable   Skin hives - benadryl as needed - Resolved  Lower back pain - Pain meds as needed, to follow with his primary neurosurgeon as an outpatient regarding rescheduling lower back surgery.   Discharge Condition:  Stable   Follow UP  Follow-up Information    Follow up with JONES,DAVID S, MD. Schedule an appointment as soon as possible for a visit in 2 weeks.   Specialty:  Neurosurgery   Contact information:   [address] [address] [place] [address] [phone]       Follow up with [name], MD. Call in 1 week.   Specialty:  Internal Medicine   Why:  post hospitalization follow up.   Contact information:   [address] [place] [address] [phone]         Discharge Instructions  and  Discharge Medications     Discharge Instructions    Diet - low sodium heart healthy    Complete by:  As directed      Discharge instructions    Complete by:  As directed   Follow with Primary MD [name], MD in 7 days   Get CBC, CMP,  checked  by Primary MD next visit.    Activity: As tolerated with Full fall precautions use walker/cane & assistance as  needed   Disposition Home **   Diet: Heart Healthy ** , with feeding assistance and aspiration precautions.  For Heart failure patients - Check your Weight same time everyday, if you gain over 2 pounds, or you develop in leg swelling, experience more shortness of breath or chest pain, call your Primary MD immediately. Follow Cardiac Low Salt Diet and 1.5 lit/day fluid restriction.   On your next visit with your primary care physician please Get Medicines reviewed and adjusted.   Please request your Prim.MD to go over all Hospital Tests and Procedure/Radiological results at the follow up, please get all Hospital records sent to your Prim MD by signing hospital  release before you go home.   If you experience worsening of your admission symptoms, develop shortness of breath, life threatening emergency, suicidal or homicidal thoughts you must seek medical attention immediately by calling 911 or calling your MD immediately  if symptoms less severe.  You Must read complete instructions/literature along with all the possible adverse reactions/side effects for all the Medicines you take and that have been prescribed to you. Take any new Medicines after you have completely understood and accpet all the possible adverse reactions/side effects.   Do not drive, operating heavy machinery, perform activities at heights, swimming or participation in water activities or provide baby sitting services if your were admitted for syncope or siezures until you have seen by Primary MD or a Neurologist and advised to do so again.  Do not drive when taking Pain medications.    Do not take more than prescribed Pain, Sleep and Anxiety Medications  Special Instructions: If you have smoked or chewed Tobacco  in the last 2 yrs please stop smoking, stop any regular Alcohol  and or any Recreational drug use.  Wear Seat belts while driving.   Please note  You were cared for by a hospitalist during your hospital  stay. If you have any questions about your discharge medications or the care you received while you were in the hospital after you are discharged, you can call the unit and asked to speak with the hospitalist on call if the hospitalist that took care of you is not available. Once you are discharged, your primary care physician will handle any further medical issues. Please note that NO REFILLS for any discharge medications will be authorized once you are discharged, as it is imperative that you return to your primary care physician (or establish a relationship with a primary care physician if you do not have one) for your aftercare needs so that they can reassess your need for medications and monitor your lab values.            Medication List    STOP taking these medications        losartan-hydrochlorothiazide 100-12.5 MG per tablet  Commonly known as:  HYZAAR      TAKE these medications        cloNIDine 0.2 MG tablet  Commonly known as:  CATAPRES  Take 1 tablet (0.2 mg total) by mouth 2 (two) times daily.     folic acid 1 MG tablet  Commonly known as:  FOLVITE  Take 1 tablet (1 mg total) by mouth daily.     hydrALAZINE 100 MG tablet  Commonly known as:  APRESOLINE  Take 1 tablet (100 mg total) by mouth 3 (three) times daily.     metoprolol tartrate 25 MG tablet  Commonly known as:  LOPRESSOR  Take 1 tablet (25 mg total) by mouth 2 (two) times daily.     multivitamin with minerals Tabs tablet  Take 1 tablet by mouth daily.     oxyCODONE-acetaminophen 5-325 MG per tablet  Commonly known as:  PERCOCET/ROXICET  Take 1 tablet by mouth every 4 (four) hours as needed for moderate pain.     tamsulosin 0.4 MG Caps capsule  Commonly known as:  FLOMAX  Take 1 capsule (0.4 mg total) by mouth daily after supper.     thiamine 100 MG tablet  Take 1 tablet (100 mg total) by mouth daily.          Diet and Activity recommendation: See Discharge Instructions above  Consults  obtained -  Nephrology   Major procedures and Radiology Reports - PLEASE review detailed and final reports for all details, in brief -      Dg Chest 2 View  04/24/2015   CLINICAL DATA:  59 year old male with acute renal failure. Preop radiograph.  EXAM: CHEST  2 VIEW  COMPARISON:  04/01/2015  FINDINGS: The heart size and mediastinal contours are within normal limits. Both lungs are clear. The visualized skeletal structures are unremarkable.  IMPRESSION: No active cardiopulmonary disease.   Electronically Signed   By: Anner Crete M.D.   On: 04/24/2015 01:36   Dg Chest 2 View  04/01/2015   CLINICAL DATA:  Hypertension, smoker.  EXAM: CHEST  2 VIEW  COMPARISON:  09/24/2011 and 12/23/2009  FINDINGS: Lungs are adequately inflated without focal consolidation or effusion. Nodule opacity over the posterior lung bases on the lateral film not well seen on the film. Cardiomediastinal silhouette and remainder of the exam is unchanged.  IMPRESSION: Nodule opacity projected over the spine in the lung bases on the lateral film. Recommend noncontrast chest CT to exclude pulmonary parenchymal nodule.   Electronically Signed   By: Marin Olp M.D.   On: 04/01/2015 13:15   US Abdomen Complete  04/24/2015   CLINICAL DATA:  Acute renal failure  EXAM: ULTRASOUND ABDOMEN COMPLETE  COMPARISON:  None.  FINDINGS: Gallbladder: Gallbladder is full and there are internal mid level echoes layering. No definitive, shadowing stone. No wall thickening or focal tenderness.  Common bile duct: Diameter: 4 mm. Where visualized, no filling defect.  Liver: No focal lesion identified. Within normal limits in parenchymal echogenicity. Antegrade flow in the imaged portal venous system.  IVC: No abnormality visualized.  Pancreas: Visualized portion unremarkable.  Spleen: Size and appearance within normal limits.  Right Kidney: Length: 11 cm. Echogenicity within normal limits. No mass or hydronephrosis visualized.  Left Kidney: Length:  12 cm. Echogenicity within normal limits. No mass or hydronephrosis visualized.  Abdominal aorta: Scattered atherosclerotic wall calcification. No visualized aneurysm.  IMPRESSION: 1. No hydronephrosis. 2. Gallbladder sludge   Electronically Signed   By: Monte Fantasia M.D.   On: 04/24/2015 15:15    Micro Results     Recent Results (from the past 240 hour(s))  Surgical pcr screen     Status: None   Collection Time: 04/23/15  3:12 PM  Result Value Ref Range Status   MRSA, PCR NEGATIVE NEGATIVE Final   Staphylococcus aureus NEGATIVE NEGATIVE Final    Comment:        The Xpert SA Assay (FDA approved for NASAL specimens in patients over 32 years of age), is one component of a comprehensive surveillance program.  Test performance has been validated by [facility] for patients greater than or equal to 65 year old. It is not intended to diagnose infection nor to guide or monitor treatment.   Urine culture     Status: None   Collection Time: 04/23/15 10:35 PM  Result Value Ref Range Status   Specimen Description URINE, RANDOM  Final   Special Requests NONE  Final   Culture NO GROWTH 1 DAY  Final   Report Status 04/25/2015 FINAL  Final  Culture, Urine     Status: None   Collection Time: 04/24/15 12:30 PM  Result Value Ref Range Status   Specimen Description URINE, RANDOM  Final   Special Requests NONE  Final   Culture NO GROWTH 1 DAY  Final   Report Status 04/25/2015 FINAL  Final       Today   Subjective:   Keith [name] today has no headache,no chest or abdominal pain, no nausea or vomiting  Objective:   Blood pressure 148/74, pulse 66, temperature 99.3 F (37.4 C), temperature source Oral, resp. rate 18, height 6' (1.829 m), weight 79.6 kg (175 lb 7.8 oz), SpO2 96 %.   Intake/Output Summary (Last 24 hours) at 04/28/15 1046 Last data filed at 04/28/15 0900  Gross per 24 hour  Intake   1820 ml  Output   1700 ml  Net    120 ml    Exam Awake Alert, Oriented x  3, No new F.N deficits, Normal affect [place].AT,PERRAL Supple Neck,No JVD, No cervical lymphadenopathy appriciated.  Symmetrical Chest wall movement, Good air movement bilaterally, CTAB RRR,No Gallops,Rubs or new Murmurs, No Parasternal Heave +ve B.Sounds, Abd Soft, Non tender, No organomegaly appriciated,  No Cyanosis, Clubbing or edema, No new Rash or bruise  Data Review   CBC w Diff: Lab Results  Component Value Date   WBC 5.4 04/25/2015   HGB 13.9 04/25/2015   HCT 40.9 04/25/2015   PLT 160 04/25/2015   LYMPHOPCT 17 04/23/2015   MONOPCT 10 04/23/2015   EOSPCT 6 04/23/2015   BASOPCT 0 04/23/2015    CMP: Lab Results  Component Value Date   NA 141 04/28/2015   K 4.3 04/28/2015   CL 108 04/28/2015   CO2 25 04/28/2015   BUN 24* 04/28/2015   CREATININE 1.44* 04/28/2015   PROT 5.4* 04/28/2015   ALBUMIN 2.6* 04/28/2015   BILITOT 0.7 04/28/2015   ALKPHOS 255* 04/28/2015   AST 85* 04/28/2015   ALT 170* 04/28/2015  .   Total Time in preparing paper work, data evaluation and todays exam - 35 minutes  ELGERGAWY, DAWOOD M.D on 04/28/2015 at 10:46 AM  [facility]   Office  [phone]

## 2015-04-28 NOTE — Telephone Encounter (Signed)
Should have fasting labs Thurs am and OV Fri

## 2015-04-28 NOTE — Progress Notes (Signed)
Physical Therapy Treatment Patient Details Name: Keith [name] MRN: [id number] DOB: 1956/05/01 Today's Date: 04/28/2015    History of Present Illness On 04-23-15 pt was undergoing pre-op workup for back surgery which was scheduled for 04-25-15. Lab results returned with abnormal values and pt was advised to report to the ED. Pt was admitted for acute renal failure. PMH consists of HTN, DM without complications, and ETOH abuse.    PT Comments    [name] was hesitant to move at all today, worried about getting his pain going too badly on discharge day; He did seem much more open to using a RW, and this therapist demonstrated proper fit and how to use RW to take pressure off of his back and hopefully help with pain moving around the house while he awaits surgery; Educated pt on back precautions to help with comfort moving in bed and getting up; He is willing to use a RW; he asked about a shower seat for his home as well -- this makes sense for him to have given his limited standing tolerance at this time  Follow Up Recommendations  Home health PT;Supervision - Intermittent     Equipment Recommendations  Rolling walker with 5" wheels, shower seat   Recommendations for Other Services       Precautions / Restrictions Precautions Precautions: Fall;Back (Back precautions for comfort) Precaution Booklet Issued: Yes (comment) Precaution Comments: ETOH withdrawal    Mobility  Bed Mobility Overal bed mobility: Needs Assistance Bed Mobility: Rolling;Sidelying to Sit Rolling: Supervision Sidelying to sit: Supervision       General bed mobility comments: Cues for log roll to help with activity tolerance and hopefully decr pain with getting up  Transfers                 General transfer comment: Politely declined transfers, getting up due to pain  Ambulation/Gait             General Gait Details: declined amb due to pain   Stairs            Wheelchair  Mobility    Modified Rankin (Stroke Patients Only)       Balance                                    Cognition Arousal/Alertness: Awake/alert Behavior During Therapy: WFL for tasks assessed/performed Overall Cognitive Status: Within Functional Limits for tasks assessed                      Exercises      General Comments        Pertinent Vitals/Pain Pain Assessment: 0-10 Pain Score: 8  Pain Location: Back pain with pain, numbness, tingling RLE Pain Descriptors / Indicators: Aching;Constant;Grimacing;Guarding Pain Intervention(s): Limited activity within patient's tolerance;Monitored during session;Patient requesting pain meds-RN notified    Home Living                      Prior Function            PT Goals (current goals can now be found in the care plan section) Acute Rehab PT Goals Patient Stated Goal: decrease back pain PT Goal Formulation: With patient Time For Goal Achievement: 05/10/15 Potential to Achieve Goals: Good Progress towards PT goals: Progressing toward goals    Frequency  Min 3X/week    PT Plan Current plan remains  appropriate    Co-evaluation             End of Session   Activity Tolerance: Patient limited by pain Patient left: in bed;with call bell/phone within reach     Time: 0942-1012 PT Time Calculation (min) (ACUTE ONLY): 30 min  Charges:  $Therapeutic Activity: 8-22 mins $Self Care/Home Management: 8-22                    G Codes:      Roney Marion Hamff 04/28/2015, 1:09 PM  Roney Marion, [place] Pager [phone] Office [phone]

## 2015-05-01 ENCOUNTER — Other Ambulatory Visit (INDEPENDENT_AMBULATORY_CARE_PROVIDER_SITE_OTHER): Payer: No Typology Code available for payment source

## 2015-05-01 DIAGNOSIS — N179 Acute kidney failure, unspecified: Secondary | ICD-10-CM | POA: Diagnosis not present

## 2015-05-01 DIAGNOSIS — R945 Abnormal results of liver function studies: Secondary | ICD-10-CM

## 2015-05-01 DIAGNOSIS — R7989 Other specified abnormal findings of blood chemistry: Secondary | ICD-10-CM | POA: Diagnosis not present

## 2015-05-01 LAB — HEPATIC FUNCTION PANEL
ALBUMIN: 3.4 g/dL — AB (ref 3.5–5.2)
ALT: 104 U/L — AB (ref 0–53)
AST: 29 U/L (ref 0–37)
Alkaline Phosphatase: 248 U/L — ABNORMAL HIGH (ref 39–117)
BILIRUBIN DIRECT: 0.2 mg/dL (ref 0.0–0.3)
TOTAL PROTEIN: 7.1 g/dL (ref 6.0–8.3)
Total Bilirubin: 0.6 mg/dL (ref 0.2–1.2)

## 2015-05-01 LAB — BASIC METABOLIC PANEL
BUN: 16 mg/dL (ref 6–23)
CHLORIDE: 102 meq/L (ref 96–112)
CO2: 28 meq/L (ref 19–32)
CREATININE: 1.3 mg/dL (ref 0.40–1.50)
Calcium: 9.5 mg/dL (ref 8.4–10.5)
GFR: 59.58 mL/min — ABNORMAL LOW (ref >60.00)
GLUCOSE: 124 mg/dL — AB (ref 70–99)
Potassium: 4.3 mEq/L (ref 3.5–5.1)
Sodium: 138 mEq/L (ref 135–145)

## 2015-05-02 ENCOUNTER — Ambulatory Visit (INDEPENDENT_AMBULATORY_CARE_PROVIDER_SITE_OTHER): Payer: No Typology Code available for payment source | Admitting: Internal Medicine

## 2015-05-02 ENCOUNTER — Encounter: Payer: Self-pay | Admitting: Internal Medicine

## 2015-05-02 VITALS — BP 100/60 | HR 67 | Temp 98.5°F | Resp 18 | Wt 163.0 lb

## 2015-05-02 DIAGNOSIS — R945 Abnormal results of liver function studies: Secondary | ICD-10-CM

## 2015-05-02 DIAGNOSIS — N179 Acute kidney failure, unspecified: Secondary | ICD-10-CM

## 2015-05-02 DIAGNOSIS — R7989 Other specified abnormal findings of blood chemistry: Secondary | ICD-10-CM

## 2015-05-02 DIAGNOSIS — M5416 Radiculopathy, lumbar region: Secondary | ICD-10-CM

## 2015-05-02 DIAGNOSIS — I10 Essential (primary) hypertension: Secondary | ICD-10-CM

## 2015-05-02 NOTE — Progress Notes (Signed)
   Subjective:    Patient ID: Keith [name], male    DOB: 07-20-1956, 59 y.o.   MRN: [id number]  HPI  He was hospitalized 9/14-9/19/16 with hepatorenal failure attributed to dehydration. Creatinine rose to 4.31; ALT was 208 and AST 133.  Since discharge he continues to have low back pain up to level VIII.A walker is being used with improved mobility.  Last night he had some blurred vision watching TV but he had just taken all of his medications.   He describes numbness and tingling from the right lateral knee to the hip constantly. If he hangs his leg off the bed he gets numbness and tingling from the right knee to the foot. He also have a sensation of a "cold fluid running down my skin" in the right leg.  He states his blood pressure at home was 144/78 today. He is on hydralazine 100 mg two twice a day rather than 1 pill 3 times a day. He's also been metoprolol 25 mg twice a day and clonidine 0.2 twice a day.  He did quit smoking and drinking at the time of admission. Yesterday he had 3 beers which he stated "tasted like crap".  He has had some "cotton mouth the last 2 weeks". He denies other upper respiratory tract, cardiovascular, GI, genitourinary symptoms.  Review of Systems  Fever, chills, sweats, or unexplained weight loss not present. No significant headaches. Mental status change or memory loss denied. Vertigo, near syncope or imbalance denied. No loss of control of bladder or bowels. No seizure stigmata.  There is no significant cough, sputum production,hemoptysis, wheezing,or  paroxysmal nocturnal dyspnea. Unexplained weight loss, abdominal pain, significant dyspepsia, dysphagia, melena, rectal bleeding, or persistently small caliber stools are not present. Dysuria, pyuria, hematuria, frequency, nocturia or polyuria are denied.     Objective:   Physical Exam Pertinent or positive findings include: Repeat blood pressure was 100/60. He has pattern alopecia, full beard,  and mustache. The right ear is deformed. The dorsalis pedis pulses are slightly decreased. The lower extremities are thin but strength is good. The right knee reflex is absent. Gait is slightly broad and unsteady.  General appearance :adequately nourished; in no distress.  Eyes: No conjunctival inflammation or scleral icterus is present.  Oral exam:  Lips and gums are healthy appearing.There is no oropharyngeal erythema or exudate noted. Dental hygiene is good.  Heart:  Normal rate and regular rhythm. S1 and S2 normal without gallop, murmur, click, rub or other extra sounds    Lungs:Chest clear to auscultation; no wheezes, rhonchi,rales ,or rubs present.No increased work of breathing.   Abdomen: bowel sounds normal, soft and non-tender without masses, organomegaly or hernias noted.  No guarding or rebound. No flank tenderness to percussion.  Vascular : all pulses equal ; no bruits present.  Skin:Warm & dry.  Intact without suspicious lesions or rashes ; no tenting or jaundice   Lymphatic: No lymphadenopathy is noted about the head, neck, axilla.   Neuro: Strength, tone  normal.     Assessment & Plan:  #1 hepatorenal failure, resolved.  #2 hypertension, controlled  #3 radiculopathy   Plan: Hydralazine will be decreased to 100 mg one half every 8 hours. He is cleared for the neurosurgery by Dr. Ronnald Ramp.

## 2015-05-02 NOTE — Progress Notes (Signed)
Pre visit review using our clinic review tool, if applicable. No additional management support is needed unless otherwise documented below in the visit note. 

## 2015-05-02 NOTE — Patient Instructions (Addendum)
Please decrease the hydralazine 100 mg to one half pill every 8 hours.  Minimal Blood Pressure Goal= AVERAGE < 140/90;  Ideal is an AVERAGE < 135/85. This AVERAGE should be calculated from @ least 5-7 BP readings taken @ different times of day on different days of week. You should not respond to isolated BP readings , but rather the AVERAGE for that week .Please bring your  blood pressure cuff to office visits to verify that it is reliable.It  can also be checked against the blood pressure device at the pharmacy. Finger or wrist cuffs are not dependable; an arm cuff is.  Please pursue the Neurosurgical procedure as the kidney and liver dysfunction have essentially resolved.

## 2015-05-05 ENCOUNTER — Other Ambulatory Visit: Payer: Self-pay | Admitting: Neurological Surgery

## 2015-05-13 ENCOUNTER — Encounter (HOSPITAL_COMMUNITY): Payer: Self-pay | Admitting: *Deleted

## 2015-05-13 MED ORDER — DEXAMETHASONE SODIUM PHOSPHATE 10 MG/ML IJ SOLN
10.0000 mg | INTRAMUSCULAR | Status: AC
  Administered 2015-05-14: 10 mg via INTRAVENOUS
  Filled 2015-05-13: qty 1

## 2015-05-13 MED ORDER — VANCOMYCIN HCL IN DEXTROSE 1-5 GM/200ML-% IV SOLN
1000.0000 mg | INTRAVENOUS | Status: AC
  Administered 2015-05-14: 1000 mg via INTRAVENOUS
  Filled 2015-05-13: qty 200

## 2015-05-13 NOTE — Progress Notes (Addendum)
Anesthesia Chart Review: SAME DAY WORK-UP.  Patient is a 59 year old male scheduled for L3-4, L4-5 MAS, PLIF on 05/14/15 by Dr. Ronnald Ramp. Surgery was initially scheduled for 04/25/15, but pre-operative labs on 04/23/15 showed acute renal failure and elevated LFTs, so he was sent to the ED and admitted from 04/23/15 - 04/28/15 for acute renal failure (multifactorial including prerenal azotemia from volume depletion/decreased po intake and obstructive uropathy) and transaminitis (with known ETOH abuse; hepatitis A, B, C negative 04/24/15).  He was seen by his PCP Dr. [name] on 05/02/15 and was given medical clearance for this procedure.  Other history includes former smoker as of 04/19/15, HTN, DM2 (pre-diabetes).  Meds currently listed are clonidine, hydralazine, losartan-HCTZ, metoprolol, Flomax, MVI. Patient told PAT phone interviewing RN that he is not taking vitamins, folic acid, or Flomax because "no one explained to him why he needed to, that made sense to him." He is not monitoring his glucose at home.  04/24/15 CXR: IMPRESSION: No active cardiopulmonary disease.  04/24/15 Abdominal U/S: IMPRESSION: 1. No hydronephrosis. 2. Gallbladder sludge.  Labs from 05/01/15 noted. Cr down to 1.30 (had peaked at 4.31). AST normal at 29, ALT still elevated at 104, but down from 140-208. Glucose 124.  A1C on 04/24/15 was 6.8.    04/23/15 EKG showed NSR. Question of lateral infarct, but suspect limb lead reversal. Before he ultimately has surgery, I would like a repeat EKG to verify. (Had normal EKG in 2013.) His EKG was not repeated during his hospitalization or at his PCP follow-up.  Most of his labs will be outdated (> 73 days old) and with recent abnormal renal function and LFTs would like to ensure they have remained stable since 05/01/15. Plan CBC, CMET, T&S, and EKG on arrival. PT/INR were normal within the month even when LFTs were significantly elevated, so I am not planning to repeat. I updated  anesthesiologist Dr. Kalman Shan.  [facility]/Anesthesiology Phone [phone] 05/13/2015 1:09 PM

## 2015-05-13 NOTE — Progress Notes (Signed)
I spoke with MR Randol Kern for pre- op call. Mr Earnshaw reports that he is not taking Vitamins, Folic Acid or Flomax, because "no one explained to him why he needed to, that made sense to him".. Mr Bublitz also reported that Dr Linna Darner decreased Hydralazine to 12.5 tid, "I take 25 mg once a day, I am not breaking those pills in half."  Mr Longmore is not checking CBGs because no one told him what the follow up would be, "I not spending my money on strips and things, unless I know why , what the follow up is, in a way that makes sense to me.  I  Told patient that the last A1C drawn 9/15/1 6 was 6.8, which is a level that is positive for Diabetes.  I informed patient of some of the negative effects of untreated diabetes.  I also told him that Flomax was ordered because his bladder had approximately 900 cc when he was catheterized in the hospital and that is  Could help with starting th flow of urine.

## 2015-05-14 ENCOUNTER — Inpatient Hospital Stay (HOSPITAL_COMMUNITY)
Admission: RE | Admit: 2015-05-14 | Discharge: 2015-05-16 | DRG: 460 | Disposition: A | Discharge status: Home / Self Care | Payer: No Typology Code available for payment source | Source: Ambulatory Visit | Attending: Neurological Surgery | Admitting: Neurological Surgery

## 2015-05-14 ENCOUNTER — Other Ambulatory Visit: Payer: Self-pay

## 2015-05-14 ENCOUNTER — Inpatient Hospital Stay (HOSPITAL_COMMUNITY): Payer: No Typology Code available for payment source | Admitting: Vascular Surgery

## 2015-05-14 ENCOUNTER — Encounter (HOSPITAL_COMMUNITY)
Admission: RE | Disposition: A | Discharge status: Home / Self Care | Payer: No Typology Code available for payment source | Source: Ambulatory Visit | Attending: Neurological Surgery

## 2015-05-14 ENCOUNTER — Inpatient Hospital Stay (HOSPITAL_COMMUNITY): Payer: No Typology Code available for payment source

## 2015-05-14 ENCOUNTER — Encounter (HOSPITAL_COMMUNITY): Payer: Self-pay | Admitting: *Deleted

## 2015-05-14 DIAGNOSIS — F1010 Alcohol abuse, uncomplicated: Secondary | ICD-10-CM | POA: Diagnosis present

## 2015-05-14 DIAGNOSIS — E119 Type 2 diabetes mellitus without complications: Secondary | ICD-10-CM | POA: Diagnosis present

## 2015-05-14 DIAGNOSIS — Z981 Arthrodesis status: Secondary | ICD-10-CM

## 2015-05-14 DIAGNOSIS — M4806 Spinal stenosis, lumbar region: Secondary | ICD-10-CM | POA: Diagnosis present

## 2015-05-14 DIAGNOSIS — Z823 Family history of stroke: Secondary | ICD-10-CM | POA: Diagnosis not present

## 2015-05-14 DIAGNOSIS — Z419 Encounter for procedure for purposes other than remedying health state, unspecified: Secondary | ICD-10-CM

## 2015-05-14 DIAGNOSIS — M4316 Spondylolisthesis, lumbar region: Secondary | ICD-10-CM | POA: Diagnosis present

## 2015-05-14 DIAGNOSIS — I129 Hypertensive chronic kidney disease with stage 1 through stage 4 chronic kidney disease, or unspecified chronic kidney disease: Secondary | ICD-10-CM | POA: Diagnosis present

## 2015-05-14 DIAGNOSIS — Z811 Family history of alcohol abuse and dependence: Secondary | ICD-10-CM | POA: Diagnosis not present

## 2015-05-14 DIAGNOSIS — M545 Low back pain: Secondary | ICD-10-CM | POA: Diagnosis present

## 2015-05-14 DIAGNOSIS — M5116 Intervertebral disc disorders with radiculopathy, lumbar region: Secondary | ICD-10-CM | POA: Diagnosis present

## 2015-05-14 DIAGNOSIS — Z833 Family history of diabetes mellitus: Secondary | ICD-10-CM | POA: Diagnosis not present

## 2015-05-14 DIAGNOSIS — Z87891 Personal history of nicotine dependence: Secondary | ICD-10-CM

## 2015-05-14 DIAGNOSIS — N189 Chronic kidney disease, unspecified: Secondary | ICD-10-CM | POA: Diagnosis present

## 2015-05-14 HISTORY — DX: Dehydration: E86.0

## 2015-05-14 HISTORY — PX: MAXIMUM ACCESS (MAS)POSTERIOR LUMBAR INTERBODY FUSION (PLIF) 2 LEVEL: SHX6369

## 2015-05-14 HISTORY — DX: Chronic kidney disease, unspecified: N18.9

## 2015-05-14 LAB — GLUCOSE, CAPILLARY
GLUCOSE-CAPILLARY: 139 mg/dL — AB (ref 65–99)
Glucose-Capillary: 135 mg/dL — ABNORMAL HIGH (ref 65–99)
Glucose-Capillary: 143 mg/dL — ABNORMAL HIGH (ref 65–99)

## 2015-05-14 LAB — CBC
HEMATOCRIT: 39.8 % (ref 39.0–52.0)
HEMOGLOBIN: 13.2 g/dL (ref 13.0–17.0)
MCH: 32 pg (ref 26.0–34.0)
MCHC: 33.2 g/dL (ref 30.0–36.0)
MCV: 96.6 fL (ref 78.0–100.0)
Platelets: 176 10*3/uL (ref 150–400)
RBC: 4.12 MIL/uL — ABNORMAL LOW (ref 4.22–5.81)
RDW: 12.6 % (ref 11.5–15.5)
WBC: 6.5 10*3/uL (ref 4.0–10.5)

## 2015-05-14 LAB — COMPREHENSIVE METABOLIC PANEL
ALBUMIN: 3.2 g/dL — AB (ref 3.5–5.0)
ALK PHOS: 99 U/L (ref 38–126)
ALT: 35 U/L (ref 17–63)
AST: 25 U/L (ref 15–41)
Anion gap: 10 (ref 5–15)
BILIRUBIN TOTAL: 0.3 mg/dL (ref 0.3–1.2)
BUN: 15 mg/dL (ref 6–20)
CALCIUM: 9 mg/dL (ref 8.9–10.3)
CO2: 27 mmol/L (ref 22–32)
CREATININE: 1.51 mg/dL — AB (ref 0.61–1.24)
Chloride: 96 mmol/L — ABNORMAL LOW (ref 101–111)
GFR calc Af Amer: 56 mL/min — ABNORMAL LOW (ref >60)
GFR calc non Af Amer: 48 mL/min — ABNORMAL LOW (ref >60)
GLUCOSE: 130 mg/dL — AB (ref 65–99)
Potassium: 4.7 mmol/L (ref 3.5–5.1)
Sodium: 133 mmol/L — ABNORMAL LOW (ref 135–145)
TOTAL PROTEIN: 6.7 g/dL (ref 6.5–8.1)

## 2015-05-14 LAB — TYPE AND SCREEN
ABO/RH(D): A POS
Antibody Screen: NEGATIVE

## 2015-05-14 SURGERY — FOR MAXIMUM ACCESS (MAS) POSTERIOR LUMBAR INTERBODY FUSION (PLIF) 2 LEVEL
Anesthesia: General | Site: Back

## 2015-05-14 MED ORDER — THROMBIN 20000 UNITS EX SOLR
CUTANEOUS | Status: DC | PRN
  Administered 2015-05-14: 15:00:00 via TOPICAL

## 2015-05-14 MED ORDER — PHENYLEPHRINE HCL 10 MG/ML IJ SOLN
INTRAMUSCULAR | Status: DC | PRN
  Administered 2015-05-14: 80 ug via INTRAVENOUS
  Administered 2015-05-14: 40 ug via INTRAVENOUS

## 2015-05-14 MED ORDER — FENTANYL CITRATE (PF) 250 MCG/5ML IJ SOLN
INTRAMUSCULAR | Status: AC
  Filled 2015-05-14: qty 5

## 2015-05-14 MED ORDER — SODIUM CHLORIDE 0.9 % IV SOLN: 250.0000 mL | INTRAVENOUS | Status: DC

## 2015-05-14 MED ORDER — OXYCODONE HCL 5 MG PO TABS: 5.0000 mg | ORAL_TABLET | Freq: Once | ORAL | Status: DC | PRN

## 2015-05-14 MED ORDER — LOSARTAN POTASSIUM-HCTZ 100-12.5 MG PO TABS: 1.0000 | ORAL_TABLET | Freq: Every day | ORAL | Status: DC

## 2015-05-14 MED ORDER — KETAMINE HCL 100 MG/ML IJ SOLN
INTRAMUSCULAR | Status: AC
Start: 2015-05-14 — End: 2015-05-14
  Administered 2015-05-14: 25 mg via INTRAVENOUS
  Administered 2015-05-14: 50 mg via INTRAVENOUS
  Filled 2015-05-14: qty 1

## 2015-05-14 MED ORDER — VANCOMYCIN HCL 1000 MG IV SOLR
INTRAVENOUS | Status: DC | PRN
  Administered 2015-05-14: 1000 mg via TOPICAL

## 2015-05-14 MED ORDER — HYDROCHLOROTHIAZIDE 12.5 MG PO CAPS
12.5000 mg | ORAL_CAPSULE | Freq: Every day | ORAL | Status: DC
  Administered 2015-05-15 – 2015-05-16 (×2): 12.5 mg via ORAL
  Filled 2015-05-14 (×2): qty 1

## 2015-05-14 MED ORDER — ACETAMINOPHEN 325 MG PO TABS: 650.0000 mg | ORAL_TABLET | ORAL | Status: DC | PRN

## 2015-05-14 MED ORDER — FENTANYL CITRATE (PF) 100 MCG/2ML IJ SOLN
INTRAMUSCULAR | Status: DC | PRN
  Administered 2015-05-14 (×4): 100 ug via INTRAVENOUS
  Administered 2015-05-14: 50 ug via INTRAVENOUS

## 2015-05-14 MED ORDER — EPHEDRINE SULFATE 50 MG/ML IJ SOLN
INTRAMUSCULAR | Status: DC | PRN
  Administered 2015-05-14 (×2): 10 mg via INTRAVENOUS
  Administered 2015-05-14: 5 mg via INTRAVENOUS

## 2015-05-14 MED ORDER — LACTATED RINGERS IV SOLN
INTRAVENOUS | Status: DC
  Administered 2015-05-14 (×4): via INTRAVENOUS

## 2015-05-14 MED ORDER — SODIUM CHLORIDE 0.9 % IJ SOLN
3.0000 mL | Freq: Two times a day (BID) | INTRAMUSCULAR | Status: DC
  Administered 2015-05-14 – 2015-05-16 (×3): 3 mL via INTRAVENOUS

## 2015-05-14 MED ORDER — LOSARTAN POTASSIUM 50 MG PO TABS
100.0000 mg | ORAL_TABLET | Freq: Every day | ORAL | Status: DC
  Administered 2015-05-15 – 2015-05-16 (×2): 100 mg via ORAL
  Filled 2015-05-14 (×2): qty 2

## 2015-05-14 MED ORDER — PROPOFOL 10 MG/ML IV BOLUS
INTRAVENOUS | Status: DC | PRN
  Administered 2015-05-14: 160 mg via INTRAVENOUS

## 2015-05-14 MED ORDER — PHENYLEPHRINE 40 MCG/ML (10ML) SYRINGE FOR IV PUSH (FOR BLOOD PRESSURE SUPPORT)
PREFILLED_SYRINGE | INTRAVENOUS | Status: AC
  Filled 2015-05-14: qty 10

## 2015-05-14 MED ORDER — SUCCINYLCHOLINE CHLORIDE 20 MG/ML IJ SOLN
INTRAMUSCULAR | Status: AC
  Filled 2015-05-14: qty 1

## 2015-05-14 MED ORDER — ONDANSETRON HCL 4 MG/2ML IJ SOLN
INTRAMUSCULAR | Status: AC
  Filled 2015-05-14: qty 2

## 2015-05-14 MED ORDER — DEXAMETHASONE SODIUM PHOSPHATE 4 MG/ML IJ SOLN
4.0000 mg | Freq: Four times a day (QID) | INTRAMUSCULAR | Status: DC
  Administered 2015-05-14 – 2015-05-15 (×2): 4 mg via INTRAVENOUS
  Filled 2015-05-14 (×2): qty 1

## 2015-05-14 MED ORDER — ONDANSETRON HCL 4 MG/2ML IJ SOLN: 4.0000 mg | INTRAMUSCULAR | Status: DC | PRN

## 2015-05-14 MED ORDER — ROCURONIUM BROMIDE 50 MG/5ML IV SOLN
INTRAVENOUS | Status: AC
  Filled 2015-05-14: qty 1

## 2015-05-14 MED ORDER — PROPOFOL 10 MG/ML IV BOLUS
INTRAVENOUS | Status: AC
  Filled 2015-05-14: qty 20

## 2015-05-14 MED ORDER — OXYCODONE HCL 5 MG/5ML PO SOLN: 5.0000 mg | Freq: Once | ORAL | Status: DC | PRN

## 2015-05-14 MED ORDER — MIDAZOLAM HCL 2 MG/2ML IJ SOLN
INTRAMUSCULAR | Status: AC
  Filled 2015-05-14: qty 4

## 2015-05-14 MED ORDER — HYDRALAZINE HCL 25 MG PO TABS
25.0000 mg | ORAL_TABLET | Freq: Every day | ORAL | Status: DC
  Administered 2015-05-15 – 2015-05-16 (×2): 25 mg via ORAL
  Filled 2015-05-14 (×2): qty 1

## 2015-05-14 MED ORDER — DEXAMETHASONE 4 MG PO TABS
4.0000 mg | ORAL_TABLET | Freq: Four times a day (QID) | ORAL | Status: DC
  Administered 2015-05-15 – 2015-05-16 (×4): 4 mg via ORAL
  Filled 2015-05-14 (×5): qty 1

## 2015-05-14 MED ORDER — ONDANSETRON HCL 4 MG/2ML IJ SOLN
INTRAMUSCULAR | Status: DC | PRN
  Administered 2015-05-14: 4 mg via INTRAVENOUS

## 2015-05-14 MED ORDER — METHOCARBAMOL 500 MG PO TABS
500.0000 mg | ORAL_TABLET | Freq: Four times a day (QID) | ORAL | Status: DC | PRN
  Administered 2015-05-14 – 2015-05-15 (×3): 500 mg via ORAL
  Filled 2015-05-14 (×3): qty 1

## 2015-05-14 MED ORDER — METHOCARBAMOL 1000 MG/10ML IJ SOLN
500.0000 mg | Freq: Four times a day (QID) | INTRAVENOUS | Status: DC | PRN
  Filled 2015-05-14: qty 5

## 2015-05-14 MED ORDER — LIDOCAINE HCL (CARDIAC) 20 MG/ML IV SOLN
INTRAVENOUS | Status: DC | PRN
  Administered 2015-05-14: 80 mg via INTRAVENOUS

## 2015-05-14 MED ORDER — SUCCINYLCHOLINE CHLORIDE 20 MG/ML IJ SOLN
INTRAMUSCULAR | Status: DC | PRN
  Administered 2015-05-14: 100 mg via INTRAVENOUS

## 2015-05-14 MED ORDER — PHENOL 1.4 % MT LIQD: 1.0000 | OROMUCOSAL | Status: DC | PRN

## 2015-05-14 MED ORDER — ACETAMINOPHEN 650 MG RE SUPP: 650.0000 mg | RECTAL | Status: DC | PRN

## 2015-05-14 MED ORDER — MIDAZOLAM HCL 5 MG/5ML IJ SOLN
INTRAMUSCULAR | Status: DC | PRN
  Administered 2015-05-14: 2 mg via INTRAVENOUS

## 2015-05-14 MED ORDER — MORPHINE SULFATE (PF) 2 MG/ML IV SOLN
1.0000 mg | INTRAVENOUS | Status: DC | PRN
  Administered 2015-05-14: 2 mg via INTRAVENOUS
  Administered 2015-05-15: 4 mg via INTRAVENOUS
  Administered 2015-05-15 (×2): 2 mg via INTRAVENOUS
  Filled 2015-05-14 (×3): qty 1
  Filled 2015-05-14 (×2): qty 2

## 2015-05-14 MED ORDER — MENTHOL 3 MG MT LOZG: 1.0000 | LOZENGE | OROMUCOSAL | Status: DC | PRN

## 2015-05-14 MED ORDER — 0.9 % SODIUM CHLORIDE (POUR BTL) OPTIME
TOPICAL | Status: DC | PRN
  Administered 2015-05-14: 1000 mL

## 2015-05-14 MED ORDER — SODIUM CHLORIDE 0.9 % IJ SOLN: 3.0000 mL | INTRAMUSCULAR | Status: DC | PRN

## 2015-05-14 MED ORDER — PROMETHAZINE HCL 25 MG/ML IJ SOLN: 6.2500 mg | INTRAMUSCULAR | Status: DC | PRN

## 2015-05-14 MED ORDER — DOCUSATE SODIUM 100 MG PO CAPS
100.0000 mg | ORAL_CAPSULE | Freq: Two times a day (BID) | ORAL | Status: DC
  Administered 2015-05-14 – 2015-05-15 (×3): 100 mg via ORAL
  Filled 2015-05-14 (×4): qty 1

## 2015-05-14 MED ORDER — BUPIVACAINE HCL (PF) 0.25 % IJ SOLN
INTRAMUSCULAR | Status: DC | PRN
  Administered 2015-05-14: 5 mL

## 2015-05-14 MED ORDER — METOPROLOL TARTRATE 25 MG PO TABS
25.0000 mg | ORAL_TABLET | Freq: Two times a day (BID) | ORAL | Status: DC
  Administered 2015-05-14 – 2015-05-16 (×4): 25 mg via ORAL
  Filled 2015-05-14 (×4): qty 1

## 2015-05-14 MED ORDER — SODIUM CHLORIDE 0.9 % IR SOLN
Status: DC | PRN
  Administered 2015-05-14: 15:00:00

## 2015-05-14 MED ORDER — VANCOMYCIN HCL IN DEXTROSE 750-5 MG/150ML-% IV SOLN
750.0000 mg | Freq: Two times a day (BID) | INTRAVENOUS | Status: DC
  Administered 2015-05-15 – 2015-05-16 (×4): 750 mg via INTRAVENOUS
  Filled 2015-05-14 (×5): qty 150

## 2015-05-14 MED ORDER — HYDROMORPHONE HCL 1 MG/ML IJ SOLN
0.2500 mg | INTRAMUSCULAR | Status: DC | PRN
  Administered 2015-05-14 (×2): 0.5 mg via INTRAVENOUS

## 2015-05-14 MED ORDER — OXYCODONE-ACETAMINOPHEN 5-325 MG PO TABS
1.0000 | ORAL_TABLET | ORAL | Status: DC | PRN
  Administered 2015-05-14 – 2015-05-16 (×7): 2 via ORAL
  Filled 2015-05-14 (×7): qty 2

## 2015-05-14 MED ORDER — LIDOCAINE HCL (CARDIAC) 20 MG/ML IV SOLN
INTRAVENOUS | Status: AC
  Filled 2015-05-14: qty 5

## 2015-05-14 MED ORDER — THROMBIN 5000 UNITS EX SOLR
OROMUCOSAL | Status: DC | PRN
  Administered 2015-05-14: 15:00:00 via TOPICAL

## 2015-05-14 MED ORDER — HYDROMORPHONE HCL 1 MG/ML IJ SOLN
INTRAMUSCULAR | Status: AC
  Filled 2015-05-14: qty 1

## 2015-05-14 MED ORDER — VANCOMYCIN HCL 1000 MG IV SOLR
INTRAVENOUS | Status: AC
  Filled 2015-05-14: qty 1000

## 2015-05-14 MED ORDER — POTASSIUM CHLORIDE IN NACL 20-0.9 MEQ/L-% IV SOLN
INTRAVENOUS | Status: DC
  Administered 2015-05-14: 23:00:00 via INTRAVENOUS
  Filled 2015-05-14 (×2): qty 1000

## 2015-05-14 MED ORDER — CLONIDINE HCL 0.1 MG PO TABS
0.2000 mg | ORAL_TABLET | Freq: Two times a day (BID) | ORAL | Status: DC
  Administered 2015-05-14 – 2015-05-16 (×4): 0.2 mg via ORAL
  Filled 2015-05-14 (×4): qty 2

## 2015-05-14 SURGICAL SUPPLY — 62 items
BAG DECANTER FOR FLEXI CONT (MISCELLANEOUS) ×2 IMPLANT
BENZOIN TINCTURE PRP APPL 2/3 (GAUZE/BANDAGES/DRESSINGS) ×2 IMPLANT
BIT DRILL PLIF MAS 5.0MM DISP (DRILL) ×1 IMPLANT
BLADE CLIPPER SURG (BLADE) ×2 IMPLANT
BONE MATRIX OSTEOCEL PRO SM (Bone Implant) ×2 IMPLANT
BUR MATCHSTICK NEURO 3.0 LAGG (BURR) ×2 IMPLANT
CAGE COROENT 11X9X23-8 (Cage) ×4 IMPLANT
CAGE COROENT MP 11X23X9 (Cage) ×4 IMPLANT
CANISTER SUCT 3000ML PPV (MISCELLANEOUS) ×2 IMPLANT
CLIP NEUROVISION LG (CLIP) ×2 IMPLANT
CONT SPEC 4OZ CLIKSEAL STRL BL (MISCELLANEOUS) ×2 IMPLANT
COVER BACK TABLE 24X17X13 BIG (DRAPES) IMPLANT
COVER BACK TABLE 60X90IN (DRAPES) ×2 IMPLANT
DRAPE C-ARM 42X72 X-RAY (DRAPES) ×2 IMPLANT
DRAPE C-ARMOR (DRAPES) ×2 IMPLANT
DRAPE LAPAROTOMY 100X72X124 (DRAPES) ×2 IMPLANT
DRAPE POUCH INSTRU U-SHP 10X18 (DRAPES) ×2 IMPLANT
DRAPE SURG 17X23 STRL (DRAPES) ×2 IMPLANT
DRILL PLIF MAS 5.0MM DISP (DRILL) ×2
DRSG OPSITE 4X5.5 SM (GAUZE/BANDAGES/DRESSINGS) ×4 IMPLANT
DRSG OPSITE POSTOP 4X6 (GAUZE/BANDAGES/DRESSINGS) ×4 IMPLANT
DURAPREP 26ML APPLICATOR (WOUND CARE) ×2 IMPLANT
ELECT REM PT RETURN 9FT ADLT (ELECTROSURGICAL) ×2
ELECTRODE REM PT RTRN 9FT ADLT (ELECTROSURGICAL) ×1 IMPLANT
EVACUATOR 1/8 PVC DRAIN (DRAIN) ×2 IMPLANT
GAUZE SPONGE 4X4 16PLY XRAY LF (GAUZE/BANDAGES/DRESSINGS) IMPLANT
GLOVE BIO SURGEON STRL SZ8 (GLOVE) ×4 IMPLANT
GOWN STRL REUS W/ TWL LRG LVL3 (GOWN DISPOSABLE) IMPLANT
GOWN STRL REUS W/ TWL XL LVL3 (GOWN DISPOSABLE) ×2 IMPLANT
GOWN STRL REUS W/TWL 2XL LVL3 (GOWN DISPOSABLE) IMPLANT
GOWN STRL REUS W/TWL LRG LVL3 (GOWN DISPOSABLE)
GOWN STRL REUS W/TWL XL LVL3 (GOWN DISPOSABLE) ×2
HEMOSTAT POWDER KIT SURGIFOAM (HEMOSTASIS) IMPLANT
KIT BASIN OR (CUSTOM PROCEDURE TRAY) ×2 IMPLANT
KIT NEEDLE NVM5 EMG ELECT (KITS) ×1 IMPLANT
KIT NEEDLE NVM5 EMG ELECTRODE (KITS) ×1
KIT ROOM TURNOVER OR (KITS) ×2 IMPLANT
MILL MEDIUM DISP (BLADE) ×2 IMPLANT
NEEDLE HYPO 25X1 1.5 SAFETY (NEEDLE) ×2 IMPLANT
NS IRRIG 1000ML POUR BTL (IV SOLUTION) ×2 IMPLANT
PACK LAMINECTOMY NEURO (CUSTOM PROCEDURE TRAY) ×2 IMPLANT
PAD ARMBOARD 7.5X6 YLW CONV (MISCELLANEOUS) ×6 IMPLANT
ROD 70MM (Rod) ×2 IMPLANT
ROD SPNL 70XPREBNT NS MAS (Rod) ×2 IMPLANT
SCREW LOCK (Screw) ×6 IMPLANT
SCREW LOCK FXNS SPNE MAS PL (Screw) ×6 IMPLANT
SCREW PLIF MAS 5.0X35 (Screw) ×8 IMPLANT
SCREW SHANK 5.0X35 (Screw) ×4 IMPLANT
SCREW TULIP 5.5 (Screw) ×4 IMPLANT
SPONGE LAP 4X18 X RAY DECT (DISPOSABLE) IMPLANT
SPONGE SURGIFOAM ABS GEL 100 (HEMOSTASIS) ×2 IMPLANT
STRIP CLOSURE SKIN 1/2X4 (GAUZE/BANDAGES/DRESSINGS) ×2 IMPLANT
SUT VIC AB 0 CT1 18XCR BRD8 (SUTURE) ×1 IMPLANT
SUT VIC AB 0 CT1 8-18 (SUTURE) ×1
SUT VIC AB 2-0 CP2 18 (SUTURE) ×2 IMPLANT
SUT VIC AB 3-0 SH 8-18 (SUTURE) ×4 IMPLANT
SYR 3ML LL SCALE MARK (SYRINGE) IMPLANT
TOWEL OR 17X24 6PK STRL BLUE (TOWEL DISPOSABLE) ×2 IMPLANT
TOWEL OR 17X26 10 PK STRL BLUE (TOWEL DISPOSABLE) ×2 IMPLANT
TRAY FOLEY CATH 16FRSI W/METER (SET/KITS/TRAYS/PACK) ×2 IMPLANT
TRAY FOLEY W/METER SILVER 14FR (SET/KITS/TRAYS/PACK) IMPLANT
WATER STERILE IRR 1000ML POUR (IV SOLUTION) ×2 IMPLANT

## 2015-05-14 NOTE — Progress Notes (Signed)
ANTIBIOTIC CONSULT NOTE - INITIAL  Pharmacy Consult for Vancomycin Indication: surgical prophylaxis  Allergies  Allergen Reactions  . Bupropion Hcl     REACTION: rash @ pressure areas (waist, groin , axillae)  . Penicillins     ? Rash , hives  . Amoxicillin Itching and Rash    Patient Measurements: Height: 6' (182.9 cm) Weight: 163 lb (73.936 kg) IBW/kg (Calculated) : 77.6 Adjusted Body Weight:   Vital Signs: Temp: 97.7 F (36.5 C) (10/05 1857) Temp Source: Oral (10/05 1857) BP: 144/74 mmHg (10/05 1857) Pulse Rate: 56 (10/05 1857) Intake/Output from previous day:   Intake/Output from this shift:    Labs:  Recent Labs  05/14/15 1045  WBC 6.5  HGB 13.2  PLT 176  CREATININE 1.51*   Estimated Creatinine Clearance: 53.7 mL/min (by C-G formula based on Cr of 1.51). No results for input(s): VANCOTROUGH, VANCOPEAK, VANCORANDOM, GENTTROUGH, GENTPEAK, GENTRANDOM, TOBRATROUGH, TOBRAPEAK, TOBRARND, AMIKACINPEAK, AMIKACINTROU, AMIKACIN in the last 72 hours.   Microbiology: Recent Results (from the past 720 hour(s))  Surgical pcr screen     Status: None   Collection Time: 04/23/15  3:12 PM  Result Value Ref Range Status   MRSA, PCR NEGATIVE NEGATIVE Final   Staphylococcus aureus NEGATIVE NEGATIVE Final    Comment:        The Xpert SA Assay (FDA approved for NASAL specimens in patients over 18 years of age), is one component of a comprehensive surveillance program.  Test performance has been validated by [facility] for patients greater than or equal to 71 year old. It is not intended to diagnose infection nor to guide or monitor treatment.   Urine culture     Status: None   Collection Time: 04/23/15 10:35 PM  Result Value Ref Range Status   Specimen Description URINE, RANDOM  Final   Special Requests NONE  Final   Culture NO GROWTH 1 DAY  Final   Report Status 04/25/2015 FINAL  Final  Culture, Urine     Status: None   Collection Time: 04/24/15 12:30 PM   Result Value Ref Range Status   Specimen Description URINE, RANDOM  Final   Special Requests NONE  Final   Culture NO GROWTH 1 DAY  Final   Report Status 04/25/2015 FINAL  Final    Medical History: Past Medical History  Diagnosis Date  . Hypertension   . Colon polyp   . Diverticulosis   . Tobacco abuse   . Chronic kidney disease 04/23/15    ARF- "resolved"  . Diabetes mellitus without complication ([place])     G9Q 04/24/15  . Dehydration 04/2015    Hospitalized for 4 days after abnormal labs found during preop appointment.    Medications:  Prescriptions prior to admission  Medication Sig Dispense Refill Last Dose  . cloNIDine (CATAPRES) 0.2 MG tablet Take 1 tablet (0.2 mg total) by mouth 2 (two) times daily. 60 tablet 2 05/14/2015 at Unknown time  . hydrALAZINE (APRESOLINE) 25 MG tablet Take 25 mg by mouth daily.   05/14/2015 at Unknown time  . losartan-hydrochlorothiazide (HYZAAR) 100-12.5 MG tablet Take 1 tablet by mouth daily.    05/13/2015 at Unknown time  . metoprolol tartrate (LOPRESSOR) 25 MG tablet Take 1 tablet (25 mg total) by mouth 2 (two) times daily. 60 tablet 0 05/14/2015 at 0800  . oxyCODONE-acetaminophen (PERCOCET/ROXICET) 5-325 MG tablet Take 1 tablet by mouth every 6 (six) hours as needed.   05/14/2015 at Unknown time   Scheduled:  . cloNIDine  0.2 mg Oral BID  . dexamethasone  4 mg Intravenous 4 times per day   Or  . dexamethasone  4 mg Oral 4 times per day  . docusate sodium  100 mg Oral BID  . hydrALAZINE  25 mg Oral Daily  . HYDROmorphone      . losartan-hydrochlorothiazide  1 tablet Oral Daily  . metoprolol tartrate  25 mg Oral BID  . sodium chloride  3 mL Intravenous Q12H  . vancomycin       Infusions:  . sodium chloride    . 0.9 % NaCl with KCl 20 mEq / L     Assessment: 59yo male with history of HTN, diverticulosis, tobacco use and DM2 presents for spine surgery. Pharmacy is consulted to dose vancomycin for surgical prophylaxis. Pt is afebrile, WBC  6.5, sCr 1.51 and has a drain in place.  Goal of Therapy:  Vancomycin trough level 15-20 mcg/ml  Plan:  Vancomycin 750mg  IV q12h Measure antibiotic drug levels at steady state Follow up culture results, renal function and clinical course  Andrey Cota. Diona Foley, PharmD Clinical Pharmacist Pager [phone] 05/14/2015,8:07 PM

## 2015-05-14 NOTE — Op Note (Signed)
05/14/2015  5:12 PM  PATIENT:  Keith [name]  59 y.o. male  PRE-OPERATIVE DIAGNOSIS:  1. Spondylolisthesis with stenosis L4-5, 2. Retrolisthesis L3-4 with a large right disc herniation with inferior free fragment, 3. Back and leg pain  POST-OPERATIVE DIAGNOSIS:  Same  PROCEDURE:   1. Decompressive lumbar laminectomy L3-4 and L4-5 requiring more work than would be required for a simple exposure of the disk for PLIF in order to adequately decompress the neural elements and address the spinal stenosis 2. Posterior lumbar interbody fusion L3-4 and L4-5 using PEEK interbody cages packed with morcellized allograft and autograft 3. Posterior fixation L3-L5 inclusive using cortical pedicle screws.  4. Intertransverse arthrodesis L3-L5 using morcellized autograft and allograft.  SURGEON:  Sherley Bounds, MD  ASSISTANTS: Dr. Cyndy Freeze  ANESTHESIA:  General  EBL: 300 ml  Total I/O In: 2000 [I.V.:2000] Out: 1350 [Urine:1050; Blood:300]  BLOOD ADMINISTERED:none  DRAINS: Hemovac   INDICATION FOR PROCEDURE: This patient presented with severe back and right leg pain. MRI showed a grade 1 spondylolisthesis with stenosis L4-5 and a large inferior free fragment at L3-4. Management without relief. Recommended decompression and cemented fusion to address is stenosis and instability. Patient understood the risks, benefits, and alternatives and potential outcomes and wished to proceed.  PROCEDURE DETAILS:  The patient was brought to the operating room. After induction of generalized endotracheal anesthesia the patient was rolled into the prone position on chest rolls and all pressure points were padded. The patient's lumbar region was cleaned and then prepped with DuraPrep and draped in the usual sterile fashion. Anesthesia was injected and then a dorsal midline incision was made and carried down to the lumbosacral fascia. The fascia was opened and the paraspinous musculature was taken down in a subperiosteal  fashion to expose L3-4 and L4-5. A self-retaining retractor was placed. Intraoperative fluoroscopy confirmed my level, and I started with placement of the L3 cortical pedicle screws. The pedicle screw entry zones were identified utilizing surface landmarks and  AP and lateral fluoroscopy. I scored the cortex with the high-speed drill and then used the hand drill and EMG monitoring to drill an upward and outward direction into the pedicle. I then tapped line to line, and the tap was also monitored. I then placed a 5-0 x 35 mm cortical pedicle screw into the pedicles of L3 bilaterally. I then turned my attention to the decompression and the spinous process was removed and complete lumbar laminectomies, hemi- facetectomies, and foraminotomies were performed at L3-4 and L4-5. The patient had significant spinal stenosis and this required more work than would be required for a simple exposure of the disc for posterior lumbar interbody fusion. Much more generous decompression was undertaken in order to adequately decompress the neural elements and address the patient's leg pain. The yellow ligament was removed to expose the underlying dura and nerve roots, and generous foraminotomies were performed to adequately decompress the neural elements. Both the exiting and traversing nerve roots were decompressed on both sides until a coronary dilator passed easily along the nerve roots. Once the decompression was complete, I turned my attention to the posterior lower lumbar interbody fusion. The epidural venous vasculature was coagulated and cut sharply. Disc space was incised and the initial discectomy was performed with pituitary rongeurs. The disc space was distracted with sequential distractors to a height of 11 mm. We then used a series of scrapers and shavers to prepare the endplates for fusion. The midline was prepared with Epstein curettes. Once the complete  discectomy was finished, we packed an appropriate sized peek  interbody cage with local autograft and morcellized allograft, gently retracted the nerve root, and tapped the cage into position at L3-4 and L4-5.  The midline between the cages was packed with morselized autograft and allograft. We then turned our attention to the placement of the lower pedicle screws. The pedicle screw entry zones were identified utilizing surface landmarks and fluoroscopy. I drilled into each pedicle utilizing the hand drill and EMG monitoring, and tapped each pedicle with the appropriate tap. We palpated with a ball probe to assure no break in the cortex. We then placed 5-0 x 35 mm cortical pedicle screws into the pedicles bilaterally at L4 and L5 bilaterally. We then decorticated the transverse processes and laid a mixture of morcellized autograft and allograft out over these to perform intertransverse arthrodesis at L3-L5. We then placed lordotic rods into the multiaxial screw heads of the pedicle screws and locked these in position with the locking caps and anti-torque device. We then checked our construct with AP and lateral fluoroscopy. Irrigated with copious amounts of bacitracin-containing saline solution. Placed a medium Hemovac drain through separate stab incision. Inspected the nerve roots once again to assure adequate decompression, lined to the dura with Gelfoam, and closed the muscle and the fascia with 0 Vicryl. Closed the subcutaneous tissues with 2-0 Vicryl and subcuticular tissues with 3-0 Vicryl. The skin was closed with benzoin and Steri-Strips. Dressing was then applied, the patient was awakened from general anesthesia and transported to the recovery room in stable condition. At the end of the procedure all sponge, needle and instrument counts were correct.   PLAN OF CARE: Admit to inpatient   PATIENT DISPOSITION:  PACU - hemodynamically stable.   Delay start of Pharmacological VTE agent (>24hrs) due to surgical blood loss or risk of bleeding:  yes

## 2015-05-14 NOTE — Transfer of Care (Signed)
Immediate Anesthesia Transfer of Care Note  Patient: Keith [name]  Procedure(s) Performed: Procedure(s) with comments: Lumbar three-lumbar four,  umbar four-lumbar five Maximum access posterior lumbar interbody fusion (N/A) - L3-4 L4-5 Maximum access posterior lumbar interbody fusion  Patient Location: PACU  Anesthesia Type:General  Level of Consciousness: awake, alert  and oriented  Airway & Oxygen Therapy: Patient Spontanous Breathing and Patient connected to nasal cannula oxygen  Post-op Assessment: Report given to RN and Post -op Vital signs reviewed and stable  Post vital signs: Reviewed and stable  Last Vitals:  Filed Vitals:   05/14/15 1717  BP:   Pulse: 66  Temp:   Resp: 28    Complications: No apparent anesthesia complications

## 2015-05-14 NOTE — H&P (Signed)
Subjective: Patient is a 59 y.o. male admitted for back and leg pain. Onset of symptoms was several months ago, gradually worsening since that time.  The pain is rated intense, and is located at the across the lower back and radiates to  Right lower extremity. The pain is described as aching and stabbing and occurs all day. The symptoms have been progressive. Symptoms are exacerbated by exercise. MRI or CT showed  Large right-sided inferior free fragment at L3-4 with retrolisthesis of L3 on L4 and a spondylolisthesis with stenosis at L4-5.   Past Medical History  Diagnosis Date  . Hypertension   . Colon polyp   . Diverticulosis   . Tobacco abuse   . Chronic kidney disease 04/23/15    ARF- "resolved"  . Diabetes mellitus without complication ([place])     L2X 04/24/15  . Dehydration 04/2015    Hospitalized for 4 days after abnormal labs found during preop appointment.    Past Surgical History  Procedure Laterality Date  . Wisdom tooth extraction    . 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 Colonoscopy with polypectomy      polyp X 1    Prior to Admission medications   Medication Sig Start Date End Date Taking? Authorizing Provider  cloNIDine (CATAPRES) 0.2 MG tablet Take 1 tablet (0.2 mg total) by mouth 2 (two) times daily. 04/01/15  Yes Hendricks Limes, MD  hydrALAZINE (APRESOLINE) 25 MG tablet Take 25 mg by mouth daily.   Yes Historical Provider, MD  losartan-hydrochlorothiazide (HYZAAR) 100-12.5 MG tablet Take 1 tablet by mouth daily.    Yes Historical Provider, MD  metoprolol tartrate (LOPRESSOR) 25 MG tablet Take 1 tablet (25 mg total) by mouth 2 (two) times daily. 04/28/15  Yes Albertine Patricia, MD  oxyCODONE-acetaminophen (PERCOCET/ROXICET) 5-325 MG tablet Take 1 tablet by mouth every 6 (six) hours as needed. 05/09/15  Yes Historical Provider, MD   Allergies  Allergen Reactions  . Bupropion Hcl     REACTION: rash @ pressure areas (waist, groin , axillae)  .  Penicillins     ? Rash , hives  . Amoxicillin Itching and Rash    Social History  Substance Use Topics  . Smoking status: Former Smoker -- 2.00 packs/day for 40 years    Types: Cigarettes    Quit date: 04/19/2015  . Smokeless tobacco: Never Used  . Alcohol Use: 1.8 oz/week    3 Cans of beer per week     Comment: case/week last Alcoholic drink 12/07/6999. 74/9/44- 6 beers since ddischarge 04/23/15    Family History  Problem Relation Age of Onset  . Stroke Father   . Melanoma Father   . Stomach cancer Father   . Alcohol abuse Father   . Diabetes Mother   . Breast cancer Sister   . Diabetes Brother   . Colon cancer Neg Hx   . Esophageal cancer Neg Hx   . Rectal cancer Neg Hx      Review of Systems  Positive ROS:  Negative;  Recent hospitalization for acute renal insufficiency  All other systems have been reviewed and were otherwise negative with the exception of those mentioned in the HPI and as above.  Objective: Vital signs in last 24 hours: Temp:  [97.7 F (36.5 C)] 97.7 F (36.5 C) (10/05 1012) Pulse Rate:  [57] 57 (10/05 1012) Resp:  [18] 18 (10/05 1012) BP: (87-90)/(55-57) 90/55 mmHg (10/05 1115) SpO2:  [  100 %] 100 % (10/05 1012) Weight:  [163 lb (73.936 kg)] 163 lb (73.936 kg) (10/05 1012)  General Appearance: Alert, cooperative, no distress, appears stated age Head: Normocephalic, without obvious abnormality, atraumatic Eyes: PERRL, conjunctiva/corneas clear, EOM's intact    Neck: Supple, symmetrical, trachea midline Back: Symmetric, no curvature, ROM normal, no CVA tenderness Lungs:  respirations unlabored Heart: Regular rate and rhythm Abdomen: Soft, non-tender Extremities: Extremities normal, atraumatic, no cyanosis or edema Pulses: 2+ and symmetric all extremities Skin: Skin color, texture, turgor normal, no rashes or lesions  NEUROLOGIC:   Mental status: Alert and oriented x4,  no aphasia, good attention span, fund of knowledge, and memory Motor Exam  - grossly normal Sensory Exam - grossly normal Reflexes: 1+ Coordination - grossly normal Gait - grossly normal Balance - grossly normal Cranial Nerves: I: smell Not tested  II: visual acuity  OS: nl    OD: nl  II: visual fields Full to confrontation  II: pupils Equal, round, reactive to light  III,VII: ptosis None  III,IV,VI: extraocular muscles  Full ROM  V: mastication Normal  V: facial light touch sensation  Normal  V,VII: corneal reflex  Present  VII: facial muscle function - upper  Normal  VII: facial muscle function - lower Normal  VIII: hearing Not tested  IX: soft palate elevation  Normal  IX,X: gag reflex Present  XI: trapezius strength  5/5  XI: sternocleidomastoid strength 5/5  XI: neck flexion strength  5/5  XII: tongue strength  Normal    Data Review Lab Results  Component Value Date   WBC 6.5 05/14/2015   HGB 13.2 05/14/2015   HCT 39.8 05/14/2015   MCV 96.6 05/14/2015   PLT 176 05/14/2015   Lab Results  Component Value Date   NA 133* 05/14/2015   K 4.7 05/14/2015   CL 96* 05/14/2015   CO2 27 05/14/2015   BUN 15 05/14/2015   CREATININE 1.51* 05/14/2015   GLUCOSE 130* 05/14/2015   Lab Results  Component Value Date   INR 1.06 04/24/2015    Assessment/Plan: Patient admitted for PLIF L3-4, L4-5. Patient has failed a reasonable attempt at conservative therapy. He has not had any alcohol in over a week. He has stopped smoking.  I explained the condition and procedure to the patient and answered any questions.  Patient wishes to proceed with procedure as planned. Understands risks/ benefits and typical outcomes of procedure.   Keith [name] S 05/14/2015 1:22 PM

## 2015-05-14 NOTE — Progress Notes (Signed)
Dr. Jillyn Hidden called and informed of low blood pressure. Pt denies dizziness. No new orders given.

## 2015-05-14 NOTE — Anesthesia Postprocedure Evaluation (Signed)
  Anesthesia Post-op Note  Patient: Keith [name]  Procedure(s) Performed: Procedure(s) with comments: Lumbar three-lumbar four,  umbar four-lumbar five Maximum access posterior lumbar interbody fusion (N/A) - L3-4 L4-5 Maximum access posterior lumbar interbody fusion  Patient Location: PACU  Anesthesia Type: General   Level of Consciousness: awake, alert  and oriented  Airway and Oxygen Therapy: Patient Spontanous Breathing  Post-op Pain: mild  Post-op Assessment: Post-op Vital signs reviewed  Post-op Vital Signs: Reviewed  Last Vitals:  Filed Vitals:   05/14/15 1717  BP:   Pulse: 66  Temp:   Resp: 28    Complications: No apparent anesthesia complications

## 2015-05-14 NOTE — Anesthesia Preprocedure Evaluation (Addendum)
Anesthesia Evaluation  Patient identified by MRN, date of birth, ID band Patient awake    Reviewed: Allergy & Precautions, H&P , NPO status , Patient's Chart, lab work & pertinent test results  History of Anesthesia Complications Negative for: history of anesthetic complications  Airway Mallampati: II  TM Distance: >3 FB Neck ROM: full    Dental  (+) Poor Dentition   Pulmonary former smoker,    Pulmonary exam normal breath sounds clear to auscultation       Cardiovascular hypertension, Pt. on medications Normal cardiovascular exam Rhythm:regular Rate:Normal     Neuro/Psych PSYCHIATRIC DISORDERS negative neurological ROS     GI/Hepatic negative GI ROS, (+)     substance abuse  alcohol use,   Endo/Other  negative endocrine ROSdiabetes, Type 2  Renal/GU CRFRenal disease     Musculoskeletal  (+) Arthritis , Lumbar spondylolisthesis   Abdominal   Peds  Hematology negative hematology ROS (+)   Anesthesia Other Findings Please see Allyn Kenner excellent note from 05/13/15 for full details  Reproductive/Obstetrics negative OB ROS                          Anesthesia Physical Anesthesia Plan  ASA: III  Anesthesia Plan: General   Post-op Pain Management:    Induction: Intravenous  Airway Management Planned: Oral ETT  Additional Equipment: None  Intra-op Plan:   Post-operative Plan: Extubation in OR  Informed Consent: I have reviewed the patients History and Physical, chart, labs and discussed the procedure including the risks, benefits and alternatives for the proposed anesthesia with the patient or authorized representative who has indicated his/her understanding and acceptance.   Dental Advisory Given  Plan Discussed with: Anesthesiologist, CRNA and Surgeon  Anesthesia Plan Comments: (Patient has recovered from his acute hepatic injury and renal failure, likely now around  baseline Cr of 1.3  EKG reviewed and normal  BP is 90/60 this am which is within 20% of clinic visit BP at 100/60, patient is NPO this am and has taken clonidine, metoprolol, and hydralazine all this am)       Anesthesia Quick Evaluation

## 2015-05-14 NOTE — Anesthesia Procedure Notes (Signed)
Procedure Name: Intubation Date/Time: 05/14/2015 1:49 PM Performed by: Greggory Stallion, Somya Jauregui L Pre-anesthesia Checklist: Patient identified, Emergency Drugs available, Suction available, Patient being monitored and Timeout performed Patient Re-evaluated:Patient Re-evaluated prior to inductionOxygen Delivery Method: Circle system utilized Preoxygenation: Pre-oxygenation with 100% oxygen Intubation Type: IV induction Ventilation: Mask ventilation without difficulty Laryngoscope Size: Mac and 4 Grade View: Grade I Tube type: Oral Tube size: 8.0 mm Number of attempts: 1 Airway Equipment and Method: Stylet Placement Confirmation: ETT inserted through vocal cords under direct vision,  positive ETCO2 and breath sounds checked- equal and bilateral Secured at: 22 cm Tube secured with: Tape Dental Injury: Teeth and Oropharynx as per pre-operative assessment

## 2015-05-15 ENCOUNTER — Encounter (HOSPITAL_COMMUNITY): Payer: Self-pay | Admitting: Neurological Surgery

## 2015-05-15 LAB — GLUCOSE, CAPILLARY
GLUCOSE-CAPILLARY: 136 mg/dL — AB (ref 65–99)
GLUCOSE-CAPILLARY: 184 mg/dL — AB (ref 65–99)

## 2015-05-15 MED ORDER — INSULIN ASPART 100 UNIT/ML ~~LOC~~ SOLN
0.0000 [IU] | Freq: Three times a day (TID) | SUBCUTANEOUS | Status: DC
  Administered 2015-05-16 (×2): 3 [IU] via SUBCUTANEOUS

## 2015-05-15 MED ORDER — ENSURE ENLIVE PO LIQD
237.0000 mL | Freq: Every day | ORAL | Status: DC | PRN
  Filled 2015-05-15: qty 237

## 2015-05-15 MED ORDER — INSULIN ASPART 100 UNIT/ML ~~LOC~~ SOLN: 0.0000 [IU] | Freq: Every day | SUBCUTANEOUS | Status: DC

## 2015-05-15 MED FILL — Heparin Sodium (Porcine) Inj 1000 Unit/ML: INTRAMUSCULAR | Qty: 30 | Status: AC

## 2015-05-15 MED FILL — Sodium Chloride IV Soln 0.9%: INTRAVENOUS | Qty: 1000 | Status: AC

## 2015-05-15 NOTE — Evaluation (Signed)
Physical Therapy Evaluation Patient Details Name: Keith [name] MRN: [id number] DOB: 11/16/1955 Today's Date: 05/15/2015   History of Present Illness  Patient is a 59 y.o. male admitted for back and leg pain now s/p PLIF L3-4, L4-5.  Clinical Impression  Patient presents at supervision to mod I level with back precautions with mobility.  No further skilled PT needed at this time and no follow up recommendations at this time.  May need outpatient PT once cleared by MD to begin core strengthening for eventual return to work.    Follow Up Recommendations No PT follow up    Equipment Recommendations  None recommended by PT    Recommendations for Other Services       Precautions / Restrictions Precautions Precautions: Fall;Back Required Braces or Orthoses: Spinal Brace Spinal Brace: Applied in sitting position      Mobility  Bed Mobility     Rolling: Modified independent (Device/Increase time) Sidelying to sit: Supervision Supine to sit: Supervision     General bed mobility comments: min cues for technique esp sit to sidelying  Transfers   Equipment used: Rolling walker (2 wheeled) Transfers: Sit to/from Stand Sit to Stand: Modified independent (Device/Increase time)            Ambulation/Gait Ambulation/Gait assistance: Modified independent (Device/Increase time) Ambulation Distance (Feet): 150 Feet Assistive device: Rolling walker (2 wheeled) Gait Pattern/deviations: Step-through pattern;Trunk flexed;Decreased stride length     General Gait Details: cues for posture  Stairs Stairs: Yes Stairs assistance: Supervision Stair Management: Two rails;Step to pattern;Forwards Number of Stairs: 4 General stair comments: demonstrated step to pattern without cues  Wheelchair Mobility    Modified Rankin (Stroke Patients Only)       Balance Overall balance assessment: Needs assistance         Standing balance support: Bilateral upper extremity  supported Standing balance-Leahy Scale: Fair Standing balance comment: can stand without UE support, but relies on walker for ambulation                             Pertinent Vitals/Pain Pain Score: 8  Pain Location: incisional pain Pain Descriptors / Indicators: Sore Pain Intervention(s): Monitored during session;Repositioned    Home Living Family/patient expects to be discharged to:: Private residence Living Arrangements: Spouse/significant other Available Help at Discharge: Family;Available PRN/intermittently;Other (Comment) Type of Home: Mobile home Home Access: Stairs to enter Entrance Stairs-Rails: Right;Left   Home Layout: One level Home Equipment: Clinical cytogeneticist - 2 wheels      Prior Function Level of Independence: Independent         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     Hand Dominance   Dominant Hand: Right    Extremity/Trunk Assessment               Lower Extremity Assessment: Overall WFL for tasks assessed         Communication   Communication: No difficulties  Cognition Arousal/Alertness: Awake/alert Behavior During Therapy: Anxious Overall Cognitive Status: Within Functional Limits for tasks assessed                      General Comments General comments (skin integrity, edema, etc.): educated on car transfers     Exercises        Assessment/Plan    PT Assessment Patent does not need any further PT services  PT Diagnosis  Acute pain;Difficulty walking   PT Problem List    PT Treatment Interventions     PT Goals (Current goals can be found in the Care Plan section) Acute Rehab PT Goals PT Goal Formulation: All assessment and education complete, DC therapy    Frequency     Barriers to discharge        Co-evaluation               End of Session Equipment Utilized During Treatment: Back brace Activity Tolerance: Patient tolerated treatment  well Patient left: in bed;with call bell/phone within reach           Time: 1340-1412 PT Time Calculation (min) (ACUTE ONLY): 32 min   Charges:   PT Evaluation $Initial PT Evaluation Tier I: 1 Procedure PT Treatments $Gait Training: 8-22 mins   PT G Codes:        WYNN,CYNDI 05/24/15, 3:43 PM  Magda Kiel, [place] May 24, 2015

## 2015-05-15 NOTE — Progress Notes (Signed)
Occupational Therapy Evaluation Patient Details Name: Keith [name] MRN: [id number] DOB: July 27, 1956 Today's Date: 05/15/2015    History of Present Illness Patient is a 59 y.o. male admitted for back and leg pain. Onset of symptoms was several months ago, gradually worsening since that time. The pain is rated intense, and is located at the across the lower back and radiates to Right lower extremity. The pain is described as aching and stabbing and occurs all day. The symptoms have been progressive. Symptoms are exacerbated by exercise. MRI or CT showed Large right-sided inferior free fragment at L3-4 with retrolisthesis of L3 on L4 and a spondylolisthesis with stenosis at L4-5.    Clinical Impression   Patient presents to OT with decreased ADL independence and safety due to the deficits listed below. Will benefit from skilled OT to maximize function and to facilitate a safe discharge plan.    Follow Up Recommendations  No OT follow up;Supervision - Intermittent    Equipment Recommendations  3 in 1 bedside comode    Recommendations for Other Services PT consult     Precautions / Restrictions Precautions Precautions: Fall;Back Required Braces or Orthoses: Spinal Brace Spinal Brace: Applied in sitting position Restrictions Weight Bearing Restrictions: No      Mobility Bed Mobility Overal bed mobility: Needs Assistance Bed Mobility: Rolling;Sidelying to Sit Rolling: Supervision Sidelying to sit: Supervision       General bed mobility comments: cues for log roll technique  Transfers Overall transfer level: Needs assistance Equipment used: Rolling walker (2 wheeled) Transfers: Sit to/from Stand Sit to Stand: Supervision Stand pivot transfers: Supervision            Balance                                            ADL Overall ADL's : Needs assistance/impaired Eating/Feeding: Independent   Grooming: Wash/dry hands;Wash/dry face;Oral  care;Independent   Upper Body Bathing: Set up;Sitting   Lower Body Bathing: Minimal assistance;Sit to/from stand   Upper Body Dressing : Set up;Sitting   Lower Body Dressing: Minimal assistance;Moderate assistance;Cueing for back precautions;Sit to/from stand   Toilet Transfer: Supervision/safety;Ambulation;Regular Toilet;RW           Functional mobility during ADLs: Supervision/safety;Rolling walker General ADL Comments: Reviewed back precautions with patient. He was able to recall them before I started review. Log roll technique taught to pt for bed mobility. Patient ambulated to bathroom. Left pt in bathroom per his request and informed nurse. Brace had not arrived to room yet.     Vision     Perception     Praxis      Pertinent Vitals/Pain Pain Assessment: 0-10 Pain Score: 7  Pain Location: back pain, R knee to just above hip Pain Descriptors / Indicators: Aching;Sore;Constant;Burning Pain Intervention(s): Limited activity within patient's tolerance     Hand Dominance Right   Extremity/Trunk Assessment Upper Extremity Assessment Upper Extremity Assessment: Overall WFL for tasks assessed   Lower Extremity Assessment Lower Extremity Assessment: Defer to PT evaluation       Communication Communication Communication: No difficulties   Cognition Arousal/Alertness: Awake/alert Behavior During Therapy: Anxious Overall Cognitive Status: Within Functional Limits for tasks assessed                     General Comments       Exercises  Shoulder Instructions      Home Living Family/patient expects to be discharged to:: Private residence Living Arrangements: Spouse/significant other Available Help at Discharge: Family;Available PRN/intermittently;Other (Comment) ((wife on disability and can provide limited physical assist)) Type of Home: Mobile home Home Access: Stairs to enter Entrance Stairs-Number of Steps: 4 Entrance Stairs-Rails:  Right;Left Home Layout: One level     Bathroom Shower/Tub: Teacher, [place] years/pre: Standard (comfort height toilet per patient) Bathroom Accessibility: Yes How Accessible: Accessible via walker Home Equipment: Shower seat;Walker - 2 wheels          Prior Functioning/Environment Level of Independence: Independent             OT Diagnosis: Acute pain   OT Problem List: Decreased knowledge of use of DME or AE;Decreased knowledge of precautions;Pain   OT Treatment/Interventions: Self-care/ADL training;DME and/or AE instruction;Therapeutic activities;Patient/family education    OT Goals(Current goals can be found in the care plan section) Acute Rehab OT Goals Patient Stated Goal: get moving OT Goal Formulation: With patient Time For Goal Achievement: 05/29/15 Potential to Achieve Goals: Good  OT Frequency: Min 2X/week   Barriers to D/C:            Co-evaluation              End of Session Equipment Utilized During Treatment: Surveyor, mining Communication: Mobility status;Other (comment) (look out for patient's bathroom call light)  Activity Tolerance: Patient tolerated treatment well Patient left: Other (comment) (pt requested to sit on the toilet for a while. RN informed.)   Time: 4098-1191 OT Time Calculation (min): 33 min Charges:  OT General Charges $OT Visit: 1 Procedure OT Evaluation $Initial OT Evaluation Tier I: 1 Procedure OT Treatments $Self Care/Home Management : 8-22 mins G-Codes:    Rashawnda Gaba A 05-25-2015, 12:42 PM

## 2015-05-15 NOTE — Progress Notes (Signed)
Patient seems to be doing well. He has appropriate back soreness. No leg pain. No numbness tingling or weakness. He has yet to mobilize because his brace has not arrived. He can get out of bed and walk to the bathroom without his brace. I've explained this to the nurse. We'll also put him on a sliding scale insulin.

## 2015-05-15 NOTE — Care Management Note (Signed)
Case Management Note  Patient Details  Name: Keith [name] MRN: [id number] Date of Birth: Nov 27, 1955  Subjective/Objective:                    Action/Plan: Patient admitted with L3-5 PLIF. Patient lives at home with his spouse. Awaiting PT/OT recommendations for discharge disposition. CM will continue to follow for discharge needs.   Expected Discharge Date:                  Expected Discharge Plan:  Home/Self Care  In-House Referral:     Discharge planning Services     Post Acute Care Choice:    Choice offered to:     DME Arranged:    DME Agency:     HH Arranged:    HH Agency:     Status of Service:  In process, will continue to follow  Medicare Important Message Given:    Date Medicare IM Given:    Medicare IM give by:    Date Additional Medicare IM Given:    Additional Medicare Important Message give by:     If discussed at [place] of Stay Meetings, dates discussed:    Additional Comments:  Pollie Friar, RN 05/15/2015, 11:52 AM

## 2015-05-15 NOTE — Progress Notes (Signed)
PT Cancellation Note  Patient Details Name: SALIF TAY MRN: [id number] DOB: 06/01/1956   Cancelled Treatment:    Reason Eval/Treat Not Completed: Other (comment); will await brace prior to PT eval.  Already seen by OT.  Will check this pm.   WYNN,CYNDI 05/15/2015, 11:51 AM  Magda Kiel, [place] 05/15/2015

## 2015-05-15 NOTE — Progress Notes (Signed)
Nutrition Brief Note  Patient identified on the Malnutrition Screening Tool (MST) Report  Wt Readings from Last 15 Encounters:  05/14/15 163 lb (73.936 kg)  05/02/15 163 lb (73.936 kg)  04/27/15 175 lb 7.8 oz (79.6 kg)  04/23/15 166 lb (75.297 kg)  04/01/15 176 lb (79.833 kg)  01/23/15 174 lb (78.926 kg)  01/15/15 172 lb (78.019 kg)  12/27/14 175 lb 8 oz (79.606 kg)  04/09/14 185 lb 2 oz (83.972 kg)  02/18/14 183 lb 3.2 oz (83.099 kg)  02/04/14 181 lb 8 oz (82.328 kg)  09/17/13 179 lb 9.6 oz (81.466 kg)  03/05/13 184 lb (83.462 kg)  05/29/12 189 lb (85.73 kg)  05/12/12 189 lb (85.73 kg)    Body mass index is 22.1 kg/(m^2). Patient meets criteria for Normal Weight based on current BMI. Pt was eating poorly PTA due to pain and inability to stand for longer than 3 minutes at a time. His weight has stabilized this past month. He declined any nutrition interventions at this time, stating he is being discharged tomorrow. RD emphasized the importance of nutrition and encouraged adequate intake of a general healthful diet. Pt requested handouts and RD provided "General Healthful Nutrition Therapy", "20 Ways to Enjoy More Fruits and Vegetables", "Healthy Snacks for Adults and Teens", and "1800 Calorie 5 day Sample Menus" from the Academy of Nutrition and Dietetics.   Current diet order is Carb Modified, patient is consuming approximately 50% of meals at this time. Labs and medications reviewed.   No further nutrition interventions warranted at this time. If nutrition issues arise, please consult RD.   Scarlette Ar RD, LDN Inpatient Clinical Dietitian Pager: [phone] After Hours Pager: [phone]

## 2015-05-16 LAB — GLUCOSE, CAPILLARY
GLUCOSE-CAPILLARY: 166 mg/dL — AB (ref 65–99)
GLUCOSE-CAPILLARY: 186 mg/dL — AB (ref 65–99)

## 2015-05-16 MED ORDER — POLYETHYLENE GLYCOL 3350 17 G PO PACK
17.0000 g | PACK | Freq: Every day | ORAL | Status: DC
  Administered 2015-05-16: 17 g via ORAL
  Filled 2015-05-16: qty 1

## 2015-05-16 MED ORDER — OXYCODONE-ACETAMINOPHEN 5-325 MG PO TABS: 1.0000 | ORAL_TABLET | Freq: Four times a day (QID) | ORAL | Status: DC | PRN

## 2015-05-16 MED ORDER — METHOCARBAMOL 500 MG PO TABS: 500.0000 mg | ORAL_TABLET | Freq: Four times a day (QID) | ORAL | Status: DC | PRN

## 2015-05-16 NOTE — Care Management Note (Signed)
Case Management Note  Patient Details  Name: Keith [name] MRN: [id number] Date of Birth: 05/24/1956  Subjective/Objective:                    Action/Plan: CM met with patient at the bedside. Patient has an order for rolling walker at discharge. Patient already has his rolling walker in the room. CM will continue to follow for discharge needs.   Expected Discharge Date:                  Expected Discharge Plan:  Home/Self Care  In-House Referral:     Discharge planning Services     Post Acute Care Choice:    Choice offered to:     DME Arranged:    DME Agency:     HH Arranged:    HH Agency:     Status of Service:  In process, will continue to follow  Medicare Important Message Given:    Date Medicare IM Given:    Medicare IM give by:    Date Additional Medicare IM Given:    Additional Medicare Important Message give by:     If discussed at [place] of Stay Meetings, dates discussed:    Additional Comments:  Pollie Friar, RN 05/16/2015, 10:05 AM

## 2015-05-16 NOTE — Progress Notes (Signed)
Occupational Therapy Treatment Patient Details Name: Keith [name] MRN: [id number] DOB: 10/20/1955 Today's Date: 05/16/2015    History of present illness Patient is a 59 y.o. male admitted for back and leg pain now s/p PLIF L3-4, L4-5.   OT comments  Pt. Initially agreeable to participation in skilled OT.  "go ahead with what you need to do". Pt. Then shifted into significant frustration and agitation.  Stating he did not need to provide physical return demo of bed mobility, donning brace, toileting, or tub transfers.  Pt. Provided verbal explanation of safe techniques with implementation of BAT but continued to decline need for review.  Reassured pt. Multiple times that this was his opportunity to review and we wanted to ensure he was given the time if he needed it.  He declined "i know how to do all that by myself, i am getting out of here today".   He then began to shift into listing all of his multiple frustrations and anger with his experience at the hospital.  Offered reassurance and any answers to his questions.  Offered with him to meet with unit supervisor to address his concerns, explained it was our goal for pt. Satisfaction.  He reluctantly agreed.  i spoke with RN assigned to him and he had charge rn meet with him.    Follow Up Recommendations  No OT follow up;Supervision - Intermittent    Equipment Recommendations  3 in 1 bedside comode    Recommendations for Other Services      Precautions / Restrictions Precautions Precautions: Fall;Back Precaution Comments: ETOH withdrawal Required Braces or Orthoses: Spinal Brace Spinal Brace: Applied in sitting position       Mobility Bed Mobility Overal bed mobility: Needs Assistance             General bed mobility comments: mod a to scoot up in bed  Transfers                 General transfer comment: Politely declined transfers, getting up due to pain    Balance                                    ADL Overall ADL's : Needs assistance/impaired                                       General ADL Comments: reviewed back precautions with pt., initially agreeable to participation in skilled OT.  "go ahead and go through your shit lets get it over with".  after thorough review of all tasks to be perfromed he was like "oh really i have to show you what i already know how to do just so i can get out of here".  i explained he did not have to and it was his choice but i wanted to offer this time and opportunity for review.  he verbally went through a full explanation of how he performed each step of bed mobility and b.room and tub tranfsers but declined need for physical demonstration of these tasks.  pts. mood and behavoir continued to escelate and he was going through each thing he was unabppy about regarding his experience with this hospital visit and was referencing previous hospital experiences.  offered multiple time to have unit supervisor meet with him to address his concerns and recieve  his feedback as it is very important to [place].  he initally declined due to not "wanting to regret anytihg he said" due to being so angry.  kept saying "i am an educated man, dont leave me in the dark tell me why, tell me what is going on".  finally agreeable to talk with rn supervisor as he said "none of you can grow if you dont know how to change or what you did wrong....but i do have some positive feedback i guess".  began to cycle again through his frustrations with wait time with meds, attempted to review some meds are prn, showed him phone numbers available on dry erase board for direct contact with staff regarding pain meds and toileting assistance which were his two main issues.  offered again to physically review bed mobility, donning brace, toielting and tub.  pt. declined stating he did not need review and was tired.  finally agreeable to talk with rn supervisor.  notified rn assigned to him who  had charge rn meet with him immediately      Vision                     Perception     Praxis      Cognition   Behavior During Therapy: Anxious;Agitated Overall Cognitive Status: Within Functional Limits for tasks assessed                       Extremity/Trunk Assessment               Exercises     Shoulder Instructions       General Comments      Pertinent Vitals/ Pain       Pain Assessment: 0-10 Pain Score: 6  Pain Location: incisional pain Pain Descriptors / Indicators: Aching;Sore Pain Intervention(s): Monitored during session;Repositioned  Home Living                                          Prior Functioning/Environment              Frequency Min 2X/week     Progress Toward Goals  OT Goals(current goals can now be found in the care plan section)        Plan Discharge plan remains appropriate    Co-evaluation                 End of Session     Activity Tolerance Patient limited by fatigue   Patient Left     Nurse Communication Other (comment) (see other portion of note, pt. with concerns required talking with charge nurse)        Time: 0727-0800 OT Time Calculation (min): 33 min  Charges: OT General Charges $OT Visit: 1 Procedure OT Treatments $Self Care/Home Management : 23-37 mins  Janice Coffin, COTA/L 05/16/2015, 8:15 AM

## 2015-05-16 NOTE — Discharge Summary (Signed)
Physician Discharge Summary  Patient ID: Keith [name] MRN: [id number] DOB/AGE: 61/26/1957 61 y.o.  Admit date: 05/14/2015 Discharge date: 05/16/2015  Admission Diagnoses: spondylolisthesis with stenosis L3-4 L4-5   Discharge Diagnoses: same   Discharged Condition: good  Hospital Course: The patient was admitted on 05/14/2015 and taken to the operating room where the patient underwent PLIF. The patient tolerated the procedure well and was taken to the recovery room and then to the floor in stable condition. The hospital course was routine. There were no complications. The wound remained clean dry and intact. Pt had appropriate back soreness. No complaints of leg pain or new N/T/W. The patient remained afebrile with stable vital signs, and tolerated a regular diet. The patient continued to increase activities, and pain was well controlled with oral pain medications.   Consults: None  Significant Diagnostic Studies:  Results for orders placed or performed during the hospital encounter of 05/14/15  CBC  Result Value Ref Range   WBC 6.5 4.0 - 10.5 K/uL   RBC 4.12 (L) 4.22 - 5.81 MIL/uL   Hemoglobin 13.2 13.0 - 17.0 g/dL   HCT 39.8 39.0 - 52.0 %   MCV 96.6 78.0 - 100.0 fL   MCH 32.0 26.0 - 34.0 pg   MCHC 33.2 30.0 - 36.0 g/dL   RDW 12.6 11.5 - 15.5 %   Platelets 176 150 - 400 K/uL  Comprehensive metabolic panel  Result Value Ref Range   Sodium 133 (L) 135 - 145 mmol/L   Potassium 4.7 3.5 - 5.1 mmol/L   Chloride 96 (L) 101 - 111 mmol/L   CO2 27 22 - 32 mmol/L   Glucose, Bld 130 (H) 65 - 99 mg/dL   BUN 15 6 - 20 mg/dL   Creatinine, Ser 1.51 (H) 0.61 - 1.24 mg/dL   Calcium 9.0 8.9 - 10.3 mg/dL   Total Protein 6.7 6.5 - 8.1 g/dL   Albumin 3.2 (L) 3.5 - 5.0 g/dL   AST 25 15 - 41 U/L   ALT 35 17 - 63 U/L   Alkaline Phosphatase 99 38 - 126 U/L   Total Bilirubin 0.3 0.3 - 1.2 mg/dL   GFR calc non Af Amer 48 (L) >60 mL/min   GFR calc Af Amer 56 (L) >60 mL/min   Anion gap 10 5 - 15   Glucose, capillary  Result Value Ref Range   Glucose-Capillary 135 (H) 65 - 99 mg/dL  Glucose, capillary  Result Value Ref Range   Glucose-Capillary 139 (H) 65 - 99 mg/dL   Comment 1 Notify RN    Comment 2 Document in Chart   Glucose, capillary  Result Value Ref Range   Glucose-Capillary 143 (H) 65 - 99 mg/dL  Glucose, capillary  Result Value Ref Range   Glucose-Capillary 136 (H) 65 - 99 mg/dL   Comment 1 Notify RN    Comment 2 Document in Chart   Glucose, capillary  Result Value Ref Range   Glucose-Capillary 184 (H) 65 - 99 mg/dL   Comment 1 Notify RN    Comment 2 Document in Chart   Glucose, capillary  Result Value Ref Range   Glucose-Capillary 166 (H) 65 - 99 mg/dL   Comment 1 Notify RN    Comment 2 Document in Chart   Glucose, capillary  Result Value Ref Range   Glucose-Capillary 186 (H) 65 - 99 mg/dL  Type and screen  Result Value Ref Range   ABO/RH(D) A POS    Antibody Screen NEG  Sample Expiration 05/17/2015     Dg Chest 2 View  04/24/2015   CLINICAL DATA:  59 year old male with acute renal failure. Preop radiograph.  EXAM: CHEST  2 VIEW  COMPARISON:  04/01/2015  FINDINGS: The heart size and mediastinal contours are within normal limits. Both lungs are clear. The visualized skeletal structures are unremarkable.  IMPRESSION: No active cardiopulmonary disease.   Electronically Signed   By: Anner Crete M.D.   On: 04/24/2015 01:36   Dg Lumbar Spine 2-3 Views  05/14/2015   CLINICAL DATA:  L3-L5 PLIF  EXAM: LUMBAR SPINE - 2-3 VIEW; DG C-ARM 61-120 MIN  COMPARISON:  01/25/2015  FINDINGS: Interbody fusion is seen at L3-4 and L4-5 with pedicle screws and posterior fixation. The degree of anterolisthesis of L4 on L5 as reduced somewhat in the interval from the prior exam. Some chronic compression deformity at L1 is seen.  IMPRESSION: Postoperative changes at L3-L5.   Electronically Signed   By: Inez Catalina M.D.   On: 05/14/2015 16:50   US Abdomen Complete  04/24/2015    CLINICAL DATA:  Acute renal failure  EXAM: ULTRASOUND ABDOMEN COMPLETE  COMPARISON:  None.  FINDINGS: Gallbladder: Gallbladder is full and there are internal mid level echoes layering. No definitive, shadowing stone. No wall thickening or focal tenderness.  Common bile duct: Diameter: 4 mm. Where visualized, no filling defect.  Liver: No focal lesion identified. Within normal limits in parenchymal echogenicity. Antegrade flow in the imaged portal venous system.  IVC: No abnormality visualized.  Pancreas: Visualized portion unremarkable.  Spleen: Size and appearance within normal limits.  Right Kidney: Length: 11 cm. Echogenicity within normal limits. No mass or hydronephrosis visualized.  Left Kidney: Length: 12 cm. Echogenicity within normal limits. No mass or hydronephrosis visualized.  Abdominal aorta: Scattered atherosclerotic wall calcification. No visualized aneurysm.  IMPRESSION: 1. No hydronephrosis. 2. Gallbladder sludge   Electronically Signed   By: Monte Fantasia M.D.   On: 04/24/2015 15:15   Dg C-arm 1-60 Min  05/14/2015   CLINICAL DATA:  L3-L5 PLIF  EXAM: LUMBAR SPINE - 2-3 VIEW; DG C-ARM 61-120 MIN  COMPARISON:  01/25/2015  FINDINGS: Interbody fusion is seen at L3-4 and L4-5 with pedicle screws and posterior fixation. The degree of anterolisthesis of L4 on L5 as reduced somewhat in the interval from the prior exam. Some chronic compression deformity at L1 is seen.  IMPRESSION: Postoperative changes at L3-L5.   Electronically Signed   By: Inez Catalina M.D.   On: 05/14/2015 16:50    Antibiotics:  Anti-infectives    Start     Dose/Rate Route Frequency Ordered Stop   05/15/15 0100  vancomycin (VANCOCIN) IVPB 750 mg/150 ml premix     750 mg 150 mL/hr over 60 Minutes Intravenous Every 12 hours 05/14/15 2016     05/14/15 1650  vancomycin (VANCOCIN) powder  Status:  Discontinued       As needed 05/14/15 1651 05/14/15 1707   05/14/15 1639  vancomycin (VANCOCIN) 1000 MG powder    Comments:   Daishon, Chui   : cabinet override      05/14/15 1639 05/15/15 0444   05/14/15 1449  bacitracin 50,000 Units in sodium chloride irrigation 0.9 % 500 mL irrigation  Status:  Discontinued       As needed 05/14/15 1450 05/14/15 1707   05/14/15 1130  vancomycin (VANCOCIN) IVPB 1000 mg/200 mL premix     1,000 mg 200 mL/hr over 60 Minutes Intravenous To [facility] 05/13/15 1110  05/14/15 1400      Discharge Exam: Blood pressure 134/62, pulse 58, temperature 98.2 F (36.8 C), temperature source Oral, resp. rate 19, height 6' (1.829 m), weight 163 lb (73.936 kg), SpO2 95 %. Neurologic: Grossly normal Incision cdi  Discharge Medications:     Medication List    TAKE these medications        cloNIDine 0.2 MG tablet  Commonly known as:  CATAPRES  Take 1 tablet (0.2 mg total) by mouth 2 (two) times daily.     hydrALAZINE 25 MG tablet  Commonly known as:  APRESOLINE  Take 25 mg by mouth daily.     losartan-hydrochlorothiazide 100-12.5 MG tablet  Commonly known as:  HYZAAR  Take 1 tablet by mouth daily.     methocarbamol 500 MG tablet  Commonly known as:  ROBAXIN  Take 1 tablet (500 mg total) by mouth every 6 (six) hours as needed for muscle spasms.     metoprolol tartrate 25 MG tablet  Commonly known as:  LOPRESSOR  Take 1 tablet (25 mg total) by mouth 2 (two) times daily.     oxyCODONE-acetaminophen 5-325 MG tablet  Commonly known as:  PERCOCET/ROXICET  Take 1-2 tablets by mouth every 6 (six) hours as needed for moderate pain.        Disposition: home   Final Dx: PLIF L3-4, L4-5      Discharge Instructions    Call MD for:  difficulty breathing, headache or visual disturbances    Complete by:  As directed      Call MD for:  persistant nausea and vomiting    Complete by:  As directed      Call MD for:  redness, tenderness, or signs of infection (pain, swelling, redness, odor or green/yellow discharge around incision site)    Complete by:  As directed      Call  MD for:  severe uncontrolled pain    Complete by:  As directed      Call MD for:  temperature >100.4    Complete by:  As directed      Diet - low sodium heart healthy    Complete by:  As directed      Discharge instructions    Complete by:  As directed   No heavy lifting, may shower, no driving, no bending or twisting     Increase activity slowly    Complete by:  As directed      Remove dressing in 48 hours    Complete by:  As directed            Follow-up Information    Follow up with Livan Hires S, MD. Schedule an appointment as soon as possible for a visit in 2 weeks.   Specialty:  Neurosurgery   Contact information:   [address] [place] [address] [phone]        Signed: Eustace Moore 05/16/2015, 11:42 AM

## 2015-05-16 NOTE — Progress Notes (Signed)
Patient being discharged home with wife IV removed discharge summary reviewed and medications with activity as well as dressing change orders prescriptions reviewed Hemovac drain removed.

## 2015-06-02 ENCOUNTER — Telehealth: Payer: Self-pay | Admitting: Internal Medicine

## 2015-06-02 NOTE — Telephone Encounter (Signed)
Hydralazine 25 mg q 8 hrs if BP averages > 140/90.

## 2015-06-02 NOTE — Telephone Encounter (Signed)
Advised patient of dr hoppers instructions, patient repeated back for understanding

## 2015-06-02 NOTE — Telephone Encounter (Signed)
Patient is seeking clarification on hydrALAZINE (APRESOLINE) 25 MG tablet [[id number]] script. Surgeon changed from what his sig was too 100 mg/day, then to 25mg  a day. He is now confused on how to proceed. He refused follow up appt. Please clarify to patient.

## 2015-06-24 ENCOUNTER — Other Ambulatory Visit: Payer: Self-pay | Admitting: Internal Medicine

## 2015-09-16 ENCOUNTER — Telehealth: Payer: Self-pay | Admitting: Internal Medicine

## 2015-09-16 NOTE — Telephone Encounter (Signed)
Pt request to speak to the assistant concern about refill for cloNIDine (CATAPRES) 0.2 MG tablet, pt only have 2 pill left. Offer to schedule an appt another PCP but pt stated he does not need an appt yet,for [place] to send a massage to the assistant about this first. Please give him a call

## 2015-09-17 NOTE — Telephone Encounter (Signed)
Spoke with pt. He will call back later today and schedule appt with new PCP

## 2015-09-21 ENCOUNTER — Other Ambulatory Visit: Payer: Self-pay | Admitting: Internal Medicine

## 2015-09-22 NOTE — Telephone Encounter (Signed)
Left msg on triage pharmacy have'nt receive request back on his Clonidine. Need to pick up today. Pt has made appt w/Dr. Quay Burow for 10/19/15. Will refill once until appt...Keith [name]

## 2015-10-02 ENCOUNTER — Telehealth: Payer: Self-pay | Admitting: Internal Medicine

## 2015-10-02 MED ORDER — HYDRALAZINE HCL 25 MG PO TABS: 25.0000 mg | ORAL_TABLET | Freq: Every day | ORAL | Status: DC

## 2015-10-02 MED ORDER — METOPROLOL TARTRATE 25 MG PO TABS: 25.0000 mg | ORAL_TABLET | Freq: Two times a day (BID) | ORAL | Status: DC

## 2015-10-02 NOTE — Telephone Encounter (Signed)
Patient has transfer appointment at the end of March with burns.  He is needing refills on metoprolol and hydralazine.  Patient uses Paediatric nurse at [facility].

## 2015-10-06 ENCOUNTER — Telehealth: Payer: Self-pay

## 2015-10-06 NOTE — Telephone Encounter (Signed)
Pt called stating that an Rx for Metoprolol was recently sent in but the direction where different and he wanted to be sure that Dr Quay Burow made this adjustment.  Pt advised via personal VM that Metoprolol directions where changed from 25 mg 2 tab BID to 25 mg 1 tab BID on 04/01/2015 by Dr Linna Darner and to start updated does, monitor BP and follow up with Dr Quay Burow as scheduled.

## 2015-10-31 ENCOUNTER — Telehealth: Payer: Self-pay | Admitting: *Deleted

## 2015-10-31 MED ORDER — CLONIDINE HCL 0.2 MG PO TABS: 0.2000 mg | ORAL_TABLET | Freq: Two times a day (BID) | ORAL | Status: DC

## 2015-10-31 NOTE — Telephone Encounter (Signed)
Pt left msg on triage stating have appt to see md last week in march, but he is out of his clonidine. Requesting rx to be sent to [facility]...Johny Chess

## 2015-11-07 ENCOUNTER — Ambulatory Visit (INDEPENDENT_AMBULATORY_CARE_PROVIDER_SITE_OTHER): Payer: 59 | Admitting: Internal Medicine

## 2015-11-07 ENCOUNTER — Encounter: Payer: Self-pay | Admitting: Internal Medicine

## 2015-11-07 VITALS — BP 164/96 | HR 63 | Temp 98.1°F | Resp 16 | Wt 165.0 lb

## 2015-11-07 DIAGNOSIS — F32A Depression, unspecified: Secondary | ICD-10-CM

## 2015-11-07 DIAGNOSIS — E785 Hyperlipidemia, unspecified: Secondary | ICD-10-CM | POA: Diagnosis not present

## 2015-11-07 DIAGNOSIS — Z0001 Encounter for general adult medical examination with abnormal findings: Secondary | ICD-10-CM

## 2015-11-07 DIAGNOSIS — F1010 Alcohol abuse, uncomplicated: Secondary | ICD-10-CM

## 2015-11-07 DIAGNOSIS — E1122 Type 2 diabetes mellitus with diabetic chronic kidney disease: Secondary | ICD-10-CM

## 2015-11-07 DIAGNOSIS — Z0000 Encounter for general adult medical examination without abnormal findings: Secondary | ICD-10-CM

## 2015-11-07 DIAGNOSIS — F418 Other specified anxiety disorders: Secondary | ICD-10-CM

## 2015-11-07 DIAGNOSIS — I10 Essential (primary) hypertension: Secondary | ICD-10-CM | POA: Diagnosis not present

## 2015-11-07 DIAGNOSIS — F419 Anxiety disorder, unspecified: Secondary | ICD-10-CM

## 2015-11-07 DIAGNOSIS — F329 Major depressive disorder, single episode, unspecified: Secondary | ICD-10-CM

## 2015-11-07 MED ORDER — LOSARTAN POTASSIUM-HCTZ 100-12.5 MG PO TABS: 1.0000 | ORAL_TABLET | Freq: Every day | ORAL | Status: DC

## 2015-11-07 NOTE — Progress Notes (Signed)
Subjective:    Patient ID: Keith [name], male    DOB: 07/08/1956, 60 y.o.   MRN: [id number]  HPI He is here for a physical exam.   He was told he was a prediabetic, but his a1c has been in the diabetic range.  He eats what he wants and is not compliant with a low sugar/carb diet. He does not exercise.   He did not take his medication for his blood pressure today.  He does not monitor his BP at home.   Medications and allergies reviewed with patient and updated if appropriate.  Patient Active Problem List   Diagnosis Date Noted  . S/P lumbar spinal fusion 05/14/2015  . Protein-calorie malnutrition, severe ([place]) 04/25/2015  . Dehydration   . Transaminitis   . Acute renal failure ([place]) 04/23/2015  . Elevated LFTs 04/23/2015  . Alcohol abuse 04/23/2015  . DM (diabetes mellitus) type II controlled with renal manifestation ([place]) 04/23/2015  . Lumbar radiculopathy 01/23/2015  . Scabies 02/05/2014  . CARPAL TUNNEL SYNDROME 12/22/2009  . HYPERPLASIA PROSTATE UNS W/O UR OBST & OTH LUTS 12/22/2009  . DIVERTICULOSIS, COLON 10/03/2008  . COLONIC POLYPS, HX OF 10/03/2008  . UNS ADVRS EFF UNS RX MEDICINAL&BIOLOGICAL SBSTNC 08/08/2007  . Hyperlipidemia 08/02/2007  . SMOKER 02/06/2007  . Essential hypertension 02/06/2007  . PRURITUS 02/06/2007  . FASTING HYPERGLYCEMIA 02/06/2007    Current Outpatient Prescriptions on File Prior to Visit  Medication Sig Dispense Refill  . cloNIDine (CATAPRES) 0.2 MG tablet Take 1 tablet (0.2 mg total) by mouth 2 (two) times daily. Must keep appt for future refills 60 tablet 0  . hydrALAZINE (APRESOLINE) 25 MG tablet Take 1 tablet (25 mg total) by mouth daily. 30 tablet 1  . losartan-hydrochlorothiazide (HYZAAR) 100-12.5 MG tablet TAKE ONE TABLET BY MOUTH ONCE DAILY 90 tablet 1  . metoprolol tartrate (LOPRESSOR) 25 MG tablet Take 1 tablet (25 mg total) by mouth 2 (two) times daily. 60 tablet 1  . oxyCODONE-acetaminophen (PERCOCET/ROXICET) 5-325 MG  tablet Take 1-2 tablets by mouth every 6 (six) hours as needed for moderate pain. 90 tablet 0   No current facility-administered medications on file prior to visit.    Past Medical History  Diagnosis Date  . Hypertension   . Colon polyp   . Diverticulosis   . Tobacco abuse   . Chronic kidney disease 04/23/15    ARF- "resolved"  . Diabetes mellitus without complication ([place])     [id number] 04/24/15  . Dehydration 04/2015    Hospitalized for 4 days after abnormal labs found during preop appointment.    Past Surgical History  Procedure Laterality Date  . Wisdom tooth extraction    . 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 Colonoscopy with polypectomy      polyp X 1  . Maximum access (mas)posterior lumbar interbody fusion (plif) 2 level N/A 05/14/2015    Procedure: Lumbar three-lumbar four,  umbar four-lumbar five Maximum access posterior lumbar interbody fusion;  Surgeon: Eustace Moore, MD;  Location: [facility];  Service: Neurosurgery;  Laterality: N/A;  L3-4 L4-5 Maximum access posterior lumbar interbody fusion    Social History   Social History  . Marital Status: Married    Spouse Name: N/A  . Number of Children: N/A  . Years of Education: N/A   Social History Main Topics  . Smoking status: Former Smoker -- 2.00 packs/day for 40 years  Types: Cigarettes    Quit date: 04/19/2015  . Smokeless tobacco: Never Used  . Alcohol Use: 1.8 oz/week    3 Cans of beer per week     Comment: case/week last Alcoholic drink [id number]. [id number] 6 beers since ddischarge 04/23/15  . Drug Use: No  . Sexual Activity: Not Asked   Other Topics Concern  . None   Social History Narrative    Family History  Problem Relation Age of Onset  . Stroke Father   . Melanoma Father   . Stomach cancer Father   . Alcohol abuse Father   . Diabetes Mother   . Breast cancer Sister   . Diabetes Brother   . Colon cancer Neg Hx   . Esophageal cancer Neg Hx   . Rectal cancer Neg Hx      Review of Systems  Constitutional: Positive for fatigue. Negative for fever, chills, appetite change and unexpected weight change.  HENT: Negative for hearing loss.   Eyes: Negative for visual disturbance.  Respiratory: Positive for cough (chronic- smoking), shortness of breath (with exertion, chronic) and wheezing (rare).   Cardiovascular: Negative for chest pain, palpitations and leg swelling.  Gastrointestinal: Positive for diarrhea. Negative for nausea, abdominal pain, constipation and blood in stool.       No gerd, occasional bleeding - in toilet water - told it was divetriculosis - rare  Genitourinary: Negative for dysuria, hematuria and difficulty urinating.  Musculoskeletal: Positive for back pain (soreness).  Skin: Negative for color change and rash.  Neurological: Negative for dizziness, light-headedness and headaches.  Psychiatric/Behavioral: Positive for dysphoric mood. The patient is nervous/anxious.        He feels on edge and irritable       Objective:   Filed Vitals:   11/07/15 1619  BP: 164/96  Pulse: 63  Temp: 98.1 F (36.7 C)  Resp: 16   Filed Weights   11/07/15 1619  Weight: 165 lb (74.844 kg)   Body mass index is 22.37 kg/(m^2).   Physical Exam Constitutional: He appears well-developed and well-nourished. No distress.  HENT:  Head: Normocephalic and atraumatic.  Right Ear: External ear normal.  Left Ear: External ear normal.  Mouth/Throat: Oropharynx is clear and moist.  Normal ear canals and TM b/l  Eyes: Conjunctivae and EOM are normal.  Neck: Neck supple. No tracheal deviation present. No thyromegaly present.  No carotid bruit  Cardiovascular: Normal rate, regular rhythm, normal heart sounds and intact distal pulses.   No murmur heard. Pulmonary/Chest: Effort normal and breath sounds normal. No respiratory distress. He has no wheezes. He has no rales.  Abdominal: Soft. Bowel sounds are normal. He exhibits no distension. There is no  tenderness.  Genitourinary: deferred  Musculoskeletal: He exhibits no edema.  Lymphadenopathy:    He has no cervical adenopathy.  Skin: Skin is warm and dry. He is not diaphoretic.  Psychiatric: He has a normal mood and affect. His behavior is normal.         Assessment & Plan:   Physical exam: Screening blood work ordered Immunizations - will do immunizations at his next visit - discussed briefly Colonoscopy -  Up to date Eye exams -  Will schedule EKG done within the past year, no need to repeat today Exercise - none- stressed regular exercise Weight, normal BMI Skin  - no concerns Substance abuse - stressed smoking cessation and decreasing alcohol intake - currently only drinks on the weekends.   See Problem List for Assessment and Plan  of chronic medical problems.  Follow-up in 6 months, sooner if needed

## 2015-11-07 NOTE — Progress Notes (Signed)
Pre visit review using our clinic review tool, if applicable. No additional management support is needed unless otherwise documented below in the visit note. 

## 2015-11-07 NOTE — Patient Instructions (Addendum)

## 2015-11-09 ENCOUNTER — Encounter: Payer: Self-pay | Admitting: Internal Medicine

## 2015-11-09 DIAGNOSIS — F329 Major depressive disorder, single episode, unspecified: Secondary | ICD-10-CM | POA: Insufficient documentation

## 2015-11-09 DIAGNOSIS — F419 Anxiety disorder, unspecified: Secondary | ICD-10-CM

## 2015-11-09 DIAGNOSIS — F32A Depression, unspecified: Secondary | ICD-10-CM | POA: Insufficient documentation

## 2015-11-09 MED ORDER — ESCITALOPRAM OXALATE 10 MG PO TABS: 10.0000 mg | ORAL_TABLET | Freq: Every day | ORAL | Status: DC

## 2015-11-09 NOTE — Assessment & Plan Note (Signed)
Advised decreased alcohol intake. Currently drinking only on weekends

## 2015-11-09 NOTE — Assessment & Plan Note (Signed)
Not on a statin Check lipid panel

## 2015-11-09 NOTE — Assessment & Plan Note (Signed)
Not controlled here today, but he did not take his medication today Stressed taking his medication daily He does get a DOT PE annually and his BP has been ok Continue current medications Encouraged him to monitor - discussed consequences of uncontrolled htn Check cmp and basic blood work

## 2015-11-09 NOTE — Assessment & Plan Note (Signed)
Stressed to him that he does have diabetes and needs to change his eating habits --low sugar/carb diet Encouraged regular exercise Keep alcohol to a minimum Smoking cessation Check a1c Schedule eye exam

## 2015-11-09 NOTE — Assessment & Plan Note (Addendum)
New He reports feeling on edge, irritable, and then into depression and anxiety He is interested in trying a medication He has never been on any medication previously We will try Lexapro 10 mg daily-discussed potential side effects

## 2015-11-10 ENCOUNTER — Other Ambulatory Visit (INDEPENDENT_AMBULATORY_CARE_PROVIDER_SITE_OTHER): Payer: 59

## 2015-11-10 DIAGNOSIS — Z0000 Encounter for general adult medical examination without abnormal findings: Secondary | ICD-10-CM

## 2015-11-10 DIAGNOSIS — E1122 Type 2 diabetes mellitus with diabetic chronic kidney disease: Secondary | ICD-10-CM

## 2015-11-10 DIAGNOSIS — I10 Essential (primary) hypertension: Secondary | ICD-10-CM | POA: Diagnosis not present

## 2015-11-10 LAB — COMPREHENSIVE METABOLIC PANEL
ALBUMIN: 4.1 g/dL (ref 3.5–5.2)
ALK PHOS: 53 U/L (ref 39–117)
ALT: 12 U/L (ref 0–53)
AST: 16 U/L (ref 0–37)
BILIRUBIN TOTAL: 0.5 mg/dL (ref 0.2–1.2)
BUN: 14 mg/dL (ref 6–23)
CO2: 28 mEq/L (ref 19–32)
CREATININE: 1.11 mg/dL (ref 0.40–1.50)
Calcium: 9.7 mg/dL (ref 8.4–10.5)
Chloride: 101 mEq/L (ref 96–112)
GFR: 71.38 mL/min (ref >60.00)
Glucose, Bld: 128 mg/dL — ABNORMAL HIGH (ref 70–99)
POTASSIUM: 4.7 meq/L (ref 3.5–5.1)
SODIUM: 136 meq/L (ref 135–145)
TOTAL PROTEIN: 7.1 g/dL (ref 6.0–8.3)

## 2015-11-10 LAB — URINALYSIS, ROUTINE W REFLEX MICROSCOPIC
Bilirubin Urine: NEGATIVE
Hgb urine dipstick: NEGATIVE
Ketones, ur: NEGATIVE
Leukocytes, UA: NEGATIVE
NITRITE: NEGATIVE
RBC / HPF: NONE SEEN (ref 0–?)
SPECIFIC GRAVITY, URINE: 1.01 (ref 1.000–1.030)
Total Protein, Urine: NEGATIVE
Urine Glucose: NEGATIVE
Urobilinogen, UA: 0.2 (ref 0.0–1.0)
WBC UA: NONE SEEN (ref 0–?)
pH: 6.5 (ref 5.0–8.0)

## 2015-11-10 LAB — HEMOGLOBIN A1C: Hgb A1c MFr Bld: 6.8 % — ABNORMAL HIGH (ref 4.6–6.5)

## 2015-11-10 LAB — CBC WITH DIFFERENTIAL/PLATELET
Basophils Absolute: 0.1 10*3/uL (ref 0.0–0.1)
Basophils Relative: 0.8 % (ref 0.0–3.0)
EOS PCT: 8.4 % — AB (ref 0.0–5.0)
Eosinophils Absolute: 0.6 10*3/uL (ref 0.0–0.7)
HCT: 45.3 % (ref 39.0–52.0)
HEMOGLOBIN: 15.3 g/dL (ref 13.0–17.0)
Lymphocytes Relative: 38.7 % (ref 12.0–46.0)
Lymphs Abs: 2.6 10*3/uL (ref 0.7–4.0)
MCHC: 33.8 g/dL (ref 30.0–36.0)
MCV: 94.3 fl (ref 78.0–100.0)
MONO ABS: 0.7 10*3/uL (ref 0.1–1.0)
Monocytes Relative: 9.7 % (ref 3.0–12.0)
Neutro Abs: 2.9 10*3/uL (ref 1.4–7.7)
Neutrophils Relative %: 42.4 % — ABNORMAL LOW (ref 43.0–77.0)
Platelets: 216 10*3/uL (ref 150.0–400.0)
RBC: 4.8 Mil/uL (ref 4.22–5.81)
RDW: 14.1 % (ref 11.5–15.5)
WBC: 6.7 10*3/uL (ref 4.0–10.5)

## 2015-11-10 LAB — LIPID PANEL
CHOL/HDL RATIO: 4
Cholesterol: 174 mg/dL (ref 0–200)
HDL: 39.7 mg/dL (ref >39.00)
LDL Cholesterol: 113 mg/dL — ABNORMAL HIGH (ref 0–99)
NONHDL: 134.63
Triglycerides: 106 mg/dL (ref 0.0–149.0)
VLDL: 21.2 mg/dL (ref 0.0–40.0)

## 2015-11-10 LAB — MICROALBUMIN / CREATININE URINE RATIO
CREATININE, U: 64.8 mg/dL
MICROALB UR: 1.1 mg/dL (ref 0.0–1.9)
MICROALB/CREAT RATIO: 1.7 mg/g (ref 0.0–30.0)

## 2015-11-10 LAB — TSH: TSH: 1.21 u[IU]/mL (ref 0.35–4.50)

## 2015-11-12 ENCOUNTER — Encounter: Payer: Self-pay | Admitting: Emergency Medicine

## 2015-11-12 ENCOUNTER — Telehealth: Payer: Self-pay | Admitting: Emergency Medicine

## 2015-11-12 DIAGNOSIS — Z299 Encounter for prophylactic measures, unspecified: Secondary | ICD-10-CM

## 2015-11-12 NOTE — Telephone Encounter (Signed)
While informing pt of lab results, pt stated that the Lexapro was making him "swimmy Headed" after taking the second dose. 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 Pt states things have been better with his wife and his mood seems to be better. Please advise if something else should be sent in.

## 2015-11-12 NOTE — Telephone Encounter (Signed)
Stop medication -- see if he wants to try something different or just monitor for now

## 2015-11-13 NOTE — Telephone Encounter (Signed)
Spoke with pt to inform. Pt is going to monitor for now. HIV blood test was not performed with other blood test. Will wait until appt in Oct to have done.

## 2015-12-08 ENCOUNTER — Other Ambulatory Visit: Payer: Self-pay | Admitting: Internal Medicine

## 2015-12-12 ENCOUNTER — Other Ambulatory Visit: Payer: Self-pay | Admitting: Internal Medicine

## 2015-12-12 MED ORDER — CLONIDINE HCL 0.2 MG PO TABS: 0.2000 mg | ORAL_TABLET | Freq: Two times a day (BID) | ORAL | Status: DC

## 2015-12-12 NOTE — Telephone Encounter (Signed)
Received call pt states he is needing his BP med saw MD back in March for his physical. Inform pt [facility] has already sent request will approved and send back. Pt is wanting a 69 day verses 52. Inform will updated and send for 90...Keith [name]

## 2016-04-30 ENCOUNTER — Ambulatory Visit (INDEPENDENT_AMBULATORY_CARE_PROVIDER_SITE_OTHER): Payer: 59 | Admitting: Psychology

## 2016-04-30 DIAGNOSIS — F1020 Alcohol dependence, uncomplicated: Secondary | ICD-10-CM

## 2016-04-30 DIAGNOSIS — F4323 Adjustment disorder with mixed anxiety and depressed mood: Secondary | ICD-10-CM | POA: Diagnosis not present

## 2016-05-09 NOTE — Progress Notes (Signed)
Subjective:    Patient ID: Keith [name], male    DOB: Sep 30, 1955, 60 y.o.   MRN: [id number]  HPI The patient is here for follow up.  Diabetes: He is controlling his sugars with lifestyle. He is fairly compliant with a diabetic diet - he has decreased his sweets. He is not exercising regularly. He does not check his sugars. He checks his feet daily and denies foot lesions. He is up-to-date with an ophthalmology examination.   Hypertension: He is taking his medication daily. He is not compliant with a low sodium diet.  He is not exercising regularly.  He does monitor his blood pressure at home in the evening - 144/78, 158/80, 172/101.    Smoking;  He wants to quit.  He would like to take Chantix again.  He has taken in the past and did not have any side effects.   He has a couple of skin concerns.  He has a family history of melanoma.    Medications and allergies reviewed with patient and updated if appropriate.  Patient Active Problem List   Diagnosis Date Noted  . Anxiety and depression 11/09/2015  . S/P lumbar spinal fusion 05/14/2015  . Alcohol abuse 04/23/2015  . DM (diabetes mellitus) type II controlled with renal manifestation ([place]) 04/23/2015  . Lumbar radiculopathy 01/23/2015  . CARPAL TUNNEL SYNDROME 12/22/2009  . HYPERPLASIA PROSTATE UNS W/O UR OBST & OTH LUTS 12/22/2009  . DIVERTICULOSIS, COLON 10/03/2008  . COLONIC POLYPS, HX OF 10/03/2008  . Hyperlipidemia 08/02/2007  . SMOKER 02/06/2007  . Essential hypertension 02/06/2007    Current Outpatient Prescriptions on File Prior to Visit  Medication Sig Dispense Refill  . cloNIDine (CATAPRES) 0.2 MG tablet Take 1 tablet (0.2 mg total) by mouth 2 (two) times daily. 180 tablet 3  . metoprolol tartrate (LOPRESSOR) 25 MG tablet TAKE ONE TABLET BY MOUTH TWICE DAILY 180 tablet 3   No current facility-administered medications on file prior to visit.     Past Medical History:  Diagnosis Date  . Chronic kidney disease  04/23/15   ARF- "resolved"  . Colon polyp   . Dehydration 04/2015   Hospitalized for 4 days after abnormal labs found during preop appointment.  . Diabetes mellitus without complication ([place])    123XX123 04/24/15  . Diverticulosis   . Hypertension   . Tobacco abuse     Past Surgical History:  Procedure Laterality Date  . colonoscopy with polypectomy     polyp X 1  . 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 MAXIMUM ACCESS (MAS)POSTERIOR LUMBAR INTERBODY FUSION (PLIF) 2 LEVEL N/A 05/14/2015   Procedure: Lumbar three-lumbar four,  umbar four-lumbar five Maximum access posterior lumbar interbody fusion;  Surgeon: Eustace Moore, MD;  Location: [facility];  Service: Neurosurgery;  Laterality: N/A;  L3-4 L4-5 Maximum access posterior lumbar interbody fusion  . WISDOM TOOTH EXTRACTION      Social History   Social History  . Marital status: Married    Spouse name: N/A  . Number of children: N/A  . Years of education: N/A   Social History Main Topics  . Smoking status: Current Every Day Smoker    Packs/day: 2.00    Years: 40.00    Types: Cigarettes    Last attempt to quit: 04/19/2015  . Smokeless tobacco: Never Used  . Alcohol use 1.8 oz/week    3 Cans of beer per week  Comment: case/weeked,   . Drug use: No  . Sexual activity: Not on file   Other Topics Concern  . Not on file   Social History Narrative  . No narrative on file    Family History  Problem Relation Age of Onset  . Stroke Father   . Melanoma Father   . Stomach cancer Father   . Alcohol abuse Father   . Diabetes Mother   . Breast cancer Sister   . Diabetes Brother   . Colon cancer Neg Hx   . Esophageal cancer Neg Hx   . Rectal cancer Neg Hx     Review of Systems  Constitutional: Negative for chills and fever.  Respiratory: Positive for cough (smoking), shortness of breath (smoking related) and wheezing (smoking).   Cardiovascular: Negative for chest pain, palpitations and leg  swelling.  Neurological: Negative for dizziness, light-headedness and headaches.  Psychiatric/Behavioral: Positive for dysphoric mood (seeing a therapist). The patient is nervous/anxious (seeing a therapist).        Objective:   Vitals:   05/10/16 0845  BP: (!) 174/104  Pulse: 83  Resp: 16  Temp: 97.9 F (36.6 C)   Filed Weights   05/10/16 0845  Weight: 176 lb (79.8 kg)   Body mass index is 23.87 kg/m.   Physical Exam    Constitutional: Appears well-developed and well-nourished. No distress.  HENT:  Head: Normocephalic and atraumatic.  Neck: Neck supple. No tracheal deviation present. No thyromegaly present.  No cervical lymphadenopathy Cardiovascular: Normal rate, regular rhythm and normal heart sounds.   No murmur heard. No carotid bruit .  No edema Pulmonary/Chest: Effort normal and breath sounds normal. No respiratory distress. Mild expiratory wheeze. No rales.  Skin: Skin is warm and dry. Not diaphoretic. a couple of concerning nevi Foot exam complete Psychiatric: Normal mood and affect. Behavior is normal.    Assessment & Plan:    See Problem List for Assessment and Plan of chronic medical problems.   F/u in 3 months

## 2016-05-09 NOTE — Patient Instructions (Addendum)
  Test(s) ordered today. Your results will be released to [place] (or called to you) after review, usually within 72hours after test completion. If any changes need to be made, you will be notified at that same time.  All other Health Maintenance issues reviewed.   All recommended immunizations and age-appropriate screenings are up-to-date or discussed.  Flu and tetanus vaccines administered today.   Medications reviewed and updated.  Changes include increasing losartan-hctz to 100-25 mg daily and increasing hydralazine to 25 mg twice daily. We will also start lipitor 20 mg daily for your cholesterol.  Chantix was also sent to your pharmacy to help you quit smoking.   Your goal BP should be less than 140/90.   Your prescription(s) have been submitted to your pharmacy. Please take as directed and contact our office if you believe you are having problem(s) with the medication(s).  A referral was ordered for dermatology  Please followup in 3 months

## 2016-05-10 ENCOUNTER — Other Ambulatory Visit (INDEPENDENT_AMBULATORY_CARE_PROVIDER_SITE_OTHER): Payer: 59

## 2016-05-10 ENCOUNTER — Ambulatory Visit (INDEPENDENT_AMBULATORY_CARE_PROVIDER_SITE_OTHER): Payer: 59 | Admitting: Internal Medicine

## 2016-05-10 ENCOUNTER — Encounter: Payer: Self-pay | Admitting: Internal Medicine

## 2016-05-10 VITALS — BP 174/104 | HR 83 | Temp 97.9°F | Resp 16 | Wt 176.0 lb

## 2016-05-10 DIAGNOSIS — E1122 Type 2 diabetes mellitus with diabetic chronic kidney disease: Secondary | ICD-10-CM | POA: Diagnosis not present

## 2016-05-10 DIAGNOSIS — Z23 Encounter for immunization: Secondary | ICD-10-CM

## 2016-05-10 DIAGNOSIS — F17200 Nicotine dependence, unspecified, uncomplicated: Secondary | ICD-10-CM

## 2016-05-10 DIAGNOSIS — Z114 Encounter for screening for human immunodeficiency virus [HIV]: Secondary | ICD-10-CM

## 2016-05-10 DIAGNOSIS — L989 Disorder of the skin and subcutaneous tissue, unspecified: Secondary | ICD-10-CM

## 2016-05-10 DIAGNOSIS — E7800 Pure hypercholesterolemia, unspecified: Secondary | ICD-10-CM

## 2016-05-10 DIAGNOSIS — I10 Essential (primary) hypertension: Secondary | ICD-10-CM

## 2016-05-10 DIAGNOSIS — Z299 Encounter for prophylactic measures, unspecified: Secondary | ICD-10-CM

## 2016-05-10 LAB — COMPREHENSIVE METABOLIC PANEL
ALBUMIN: 4.1 g/dL (ref 3.5–5.2)
ALT: 10 U/L (ref 0–53)
AST: 14 U/L (ref 0–37)
Alkaline Phosphatase: 59 U/L (ref 39–117)
BILIRUBIN TOTAL: 0.5 mg/dL (ref 0.2–1.2)
BUN: 11 mg/dL (ref 6–23)
CHLORIDE: 100 meq/L (ref 96–112)
CO2: 30 meq/L (ref 19–32)
CREATININE: 1.17 mg/dL (ref 0.40–1.50)
Calcium: 9.3 mg/dL (ref 8.4–10.5)
GFR: 67.06 mL/min (ref >60.00)
Glucose, Bld: 135 mg/dL — ABNORMAL HIGH (ref 70–99)
Potassium: 4.7 mEq/L (ref 3.5–5.1)
SODIUM: 136 meq/L (ref 135–145)
Total Protein: 7.5 g/dL (ref 6.0–8.3)

## 2016-05-10 LAB — HIV ANTIBODY (ROUTINE TESTING W REFLEX): HIV 1&2 Ab, 4th Generation: NONREACTIVE

## 2016-05-10 LAB — LIPID PANEL
CHOL/HDL RATIO: 4
Cholesterol: 183 mg/dL (ref 0–200)
HDL: 46.1 mg/dL (ref >39.00)
LDL Cholesterol: 122 mg/dL — ABNORMAL HIGH (ref 0–99)
NONHDL: 137.28
Triglycerides: 76 mg/dL (ref 0.0–149.0)
VLDL: 15.2 mg/dL (ref 0.0–40.0)

## 2016-05-10 LAB — HEMOGLOBIN A1C: HEMOGLOBIN A1C: 6.3 % (ref 4.6–6.5)

## 2016-05-10 MED ORDER — HYDRALAZINE HCL 25 MG PO TABS: 25.0000 mg | ORAL_TABLET | Freq: Two times a day (BID) | ORAL | 3 refills | Status: DC

## 2016-05-10 MED ORDER — VARENICLINE TARTRATE 0.5 MG X 11 & 1 MG X 42 PO MISC: ORAL | 0 refills | Status: DC

## 2016-05-10 MED ORDER — ATORVASTATIN CALCIUM 20 MG PO TABS: 20.0000 mg | ORAL_TABLET | Freq: Every day | ORAL | 3 refills | Status: DC

## 2016-05-10 MED ORDER — LOSARTAN POTASSIUM-HCTZ 100-25 MG PO TABS: 1.0000 | ORAL_TABLET | Freq: Every day | ORAL | 3 refills | Status: DC

## 2016-05-10 NOTE — Assessment & Plan Note (Signed)
Blood pressure not controlled here or at home Increase hydralazine to twice daily-currently only taking once a day. This is still not ideal, but he admits he would not be able to take it 3 times a day Increase losartan-hydrochlorothiazide 100-12.5 mg daily to 100-25 mg daily Continue to monitor at home Smoking cessation stressed Encouraged increased exercise, weight loss, healthy diet and decreased alcohol intake

## 2016-05-10 NOTE — Assessment & Plan Note (Signed)
Stressed smoking cessation He wants to try Chantix again-sent to pharmacy He did not have any side effects last time he took it in is aware of the possible side effects

## 2016-05-10 NOTE — Progress Notes (Signed)
Pre visit review using our clinic review tool, if applicable. No additional management support is needed unless otherwise documented below in the visit note. 

## 2016-05-10 NOTE — Assessment & Plan Note (Signed)
Check a1c encouraged low sugar diet/low carb diet Smoking cessation

## 2016-05-10 NOTE — Assessment & Plan Note (Signed)
Discussed high cholesterol and risk for heart disease and stroke Degrees does take a statin-start Lipitor 20 mg daily Discussed possible side effects Discussed improving lifestyle

## 2016-05-11 ENCOUNTER — Telehealth: Payer: Self-pay | Admitting: Emergency Medicine

## 2016-05-11 NOTE — Telephone Encounter (Signed)
I just sent it in yesterday. Should last one month and then he needs to go to the continuation pack

## 2016-05-11 NOTE — Telephone Encounter (Signed)
Received refill request for Chantix starter pack. Please advise

## 2016-05-15 ENCOUNTER — Telehealth: Payer: Self-pay

## 2016-05-15 NOTE — Telephone Encounter (Signed)
PA initiated and APPROVED via CoverMyMeds key [id number]

## 2016-05-21 ENCOUNTER — Ambulatory Visit (INDEPENDENT_AMBULATORY_CARE_PROVIDER_SITE_OTHER): Payer: 59 | Admitting: Psychology

## 2016-05-21 DIAGNOSIS — F4323 Adjustment disorder with mixed anxiety and depressed mood: Secondary | ICD-10-CM | POA: Diagnosis not present

## 2016-05-21 DIAGNOSIS — F1020 Alcohol dependence, uncomplicated: Secondary | ICD-10-CM | POA: Diagnosis not present

## 2016-06-21 ENCOUNTER — Ambulatory Visit (INDEPENDENT_AMBULATORY_CARE_PROVIDER_SITE_OTHER): Payer: 59 | Admitting: Psychology

## 2016-06-21 DIAGNOSIS — F4323 Adjustment disorder with mixed anxiety and depressed mood: Secondary | ICD-10-CM

## 2016-06-21 DIAGNOSIS — F1020 Alcohol dependence, uncomplicated: Secondary | ICD-10-CM | POA: Diagnosis not present

## 2016-06-25 ENCOUNTER — Telehealth: Payer: Self-pay | Admitting: Internal Medicine

## 2016-06-25 MED ORDER — VARENICLINE TARTRATE 1 MG PO TABS: 1.0000 mg | ORAL_TABLET | Freq: Two times a day (BID) | ORAL | 1 refills | Status: DC

## 2016-06-25 NOTE — Telephone Encounter (Signed)
Please advise, Chantex on current med list is starter pack.

## 2016-06-25 NOTE — Telephone Encounter (Signed)
Pt needs refill on his chantex meds   [facility] [address]

## 2016-07-05 ENCOUNTER — Telehealth: Payer: Self-pay | Admitting: Internal Medicine

## 2016-07-05 NOTE — Telephone Encounter (Signed)
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  Patient would like to know if Dr. Quay Burow could change this without appt.  Patient states he has to get BP down and get DOT complete within 45 day.

## 2016-07-06 MED ORDER — HYDRALAZINE HCL 50 MG PO TABS
50.0000 mg | ORAL_TABLET | Freq: Three times a day (TID) | ORAL | 3 refills | Status: DC
Start: 2016-07-06 — End: 2016-07-23

## 2016-07-06 NOTE — Telephone Encounter (Signed)
Lets increase hydralazine to 50 mg and increase to three times daily   Can he measure it at home?  Also he should come in to see me in about two weeks to check.  Next rx sent to pharmacy.

## 2016-07-06 NOTE — Telephone Encounter (Signed)
Please advise 

## 2016-07-07 NOTE — Telephone Encounter (Signed)
Spoke with pt to inform of change, scheduled appt for follow-up

## 2016-07-12 ENCOUNTER — Ambulatory Visit (INDEPENDENT_AMBULATORY_CARE_PROVIDER_SITE_OTHER): Payer: 59 | Admitting: Psychology

## 2016-07-12 DIAGNOSIS — F4321 Adjustment disorder with depressed mood: Secondary | ICD-10-CM

## 2016-07-22 NOTE — Progress Notes (Signed)
Subjective:    Patient ID: Noralee Chars, male    DOB: 05-Aug-1956, 60 y.o.   MRN: [id number]  HPI The patient is here for follow up of hypertension.  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  I increased his hydralazine from 25 mg BID to 50 mg TID, which he has been taking. He has been getting 155/92, 148/87 last night at home.    He denies chest pain, palps, sob, headaches and lightheadedness.  He is trying to quit smoking and has cut down.  He is still drinking too much alcohol at night.  He is not careful with the sodium in his diet.    Medications and allergies reviewed with patient and updated if appropriate.  Patient Active Problem List   Diagnosis Date Noted  . Anxiety and depression 11/09/2015  . S/P lumbar spinal fusion 05/14/2015  . Alcohol abuse 04/23/2015  . DM (diabetes mellitus) type II controlled with renal manifestation ([place]) 04/23/2015  . Lumbar radiculopathy 01/23/2015  . CARPAL TUNNEL SYNDROME 12/22/2009  . HYPERPLASIA PROSTATE UNS W/O UR OBST & OTH LUTS 12/22/2009  . DIVERTICULOSIS, COLON 10/03/2008  . COLONIC POLYPS, HX OF 10/03/2008  . Hyperlipidemia 08/02/2007  . SMOKER 02/06/2007  . Essential hypertension 02/06/2007    Current Outpatient Prescriptions on File Prior to Visit  Medication Sig Dispense Refill  . atorvastatin (LIPITOR) 20 MG tablet Take 1 tablet (20 mg total) by mouth daily. 90 tablet 3  . cloNIDine (CATAPRES) 0.2 MG tablet Take 1 tablet (0.2 mg total) by mouth 2 (two) times daily. 180 tablet 3  . hydrALAZINE (APRESOLINE) 50 MG tablet Take 1 tablet (50 mg total) by mouth 3 (three) times daily. 90 tablet 3  . losartan-hydrochlorothiazide (HYZAAR) 100-25 MG tablet Take 1 tablet by mouth daily. 90 tablet 3  . metoprolol tartrate (LOPRESSOR) 25 MG tablet TAKE ONE TABLET BY MOUTH TWICE DAILY 180 tablet 3  . varenicline (CHANTIX CONTINUING MONTH PAK) 1 MG tablet Take 1 tablet (1 mg total) by mouth 2  (two) times daily. 60 tablet 1   No current facility-administered medications on file prior to visit.     Past Medical History:  Diagnosis Date  . Chronic kidney disease 04/23/15   ARF- "resolved"  . Colon polyp   . Dehydration 04/2015   Hospitalized for 4 days after abnormal labs found during preop appointment.  . Diabetes mellitus without complication ([place])    [id number] 04/24/15  . Diverticulosis   . Hypertension   . Tobacco abuse     Past Surgical History:  Procedure Laterality Date  . colonoscopy with polypectomy     polyp X 1  . 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 MAXIMUM ACCESS (MAS)POSTERIOR LUMBAR INTERBODY FUSION (PLIF) 2 LEVEL N/A 05/14/2015   Procedure: Lumbar three-lumbar four,  umbar four-lumbar five Maximum access posterior lumbar interbody fusion;  Surgeon: Eustace Moore, MD;  Location: [facility];  Service: Neurosurgery;  Laterality: N/A;  L3-4 L4-5 Maximum access posterior lumbar interbody fusion  . WISDOM TOOTH EXTRACTION      Social History   Social History  . Marital status: Married    Spouse name: N/A  . Number of children: N/A  . Years of education: N/A   Social History Main Topics  . Smoking status: Current Every Day Smoker    Packs/day: 2.00    Years: 40.00  Types: Cigarettes    Last attempt to quit: 04/19/2015  . Smokeless tobacco: Never Used  . Alcohol use 1.8 oz/week    3 Cans of beer per week     Comment: case/weeked,   . Drug use: No  . Sexual activity: Not Asked   Other Topics Concern  . None   Social History Narrative  . None    Family History  Problem Relation Age of Onset  . Stroke Father   . Melanoma Father   . Stomach cancer Father   . Alcohol abuse Father   . Diabetes Mother   . Breast cancer Sister   . Diabetes Brother   . Colon cancer Neg Hx   . Esophageal cancer Neg Hx   . Rectal cancer Neg Hx     Review of Systems  Constitutional: Negative for unexpected weight change.  Respiratory:  Negative for cough, shortness of breath and wheezing.   Cardiovascular: Negative for chest pain, palpitations and leg swelling.  Neurological: Negative for light-headedness and headaches.       Objective:   Vitals:   07/23/16 1130 07/23/16 1138  BP: 132/82 (!) 158/92  Pulse: 70   Resp: 16   Temp: 97.8 F (36.6 C)    Filed Weights   07/23/16 1130  Weight: 178 lb (80.7 kg)   Body mass index is 24.14 kg/m.   Physical Exam    Constitutional: Appears well-developed and well-nourished. No distress.  HENT:  Head: Normocephalic and atraumatic.  Neck: Neck supple. No tracheal deviation present. No thyromegaly present.  No cervical lymphadenopathy Cardiovascular: Normal rate, regular rhythm and normal heart sounds.   No murmur heard. No carotid bruit .  No edema Pulmonary/Chest: Effort normal. No respiratory distress. Mild inspiratory wheeze. No rales.  Skin: Skin is warm and dry. Not diaphoretic.  Psychiatric: Normal mood and affect. Behavior is normal.      Assessment & Plan:    See Problem List for Assessment and Plan of chronic medical problems.

## 2016-07-23 ENCOUNTER — Encounter: Payer: Self-pay | Admitting: Internal Medicine

## 2016-07-23 ENCOUNTER — Ambulatory Visit (INDEPENDENT_AMBULATORY_CARE_PROVIDER_SITE_OTHER): Payer: 59 | Admitting: Internal Medicine

## 2016-07-23 VITALS — BP 158/92 | HR 70 | Temp 97.8°F | Resp 16 | Wt 178.0 lb

## 2016-07-23 DIAGNOSIS — I10 Essential (primary) hypertension: Secondary | ICD-10-CM

## 2016-07-23 MED ORDER — HYDRALAZINE HCL 50 MG PO TABS: 75.0000 mg | ORAL_TABLET | Freq: Three times a day (TID) | ORAL | 3 refills | Status: DC

## 2016-07-23 MED ORDER — VARENICLINE TARTRATE 1 MG PO TABS: 1.0000 mg | ORAL_TABLET | Freq: Two times a day (BID) | ORAL | 1 refills | Status: DC

## 2016-07-23 MED ORDER — CLONIDINE HCL 0.3 MG PO TABS: 0.3000 mg | ORAL_TABLET | Freq: Two times a day (BID) | ORAL | 5 refills | Status: DC

## 2016-07-23 NOTE — Assessment & Plan Note (Signed)
Not controlled Encouraged smoking cessation, cut down on alcohol intake Stressed low sodium diet Continue metoprolol 25 mg BID Losartan-hctz 100-25 mg dialy Increase clonidine 0.3 mg twice daily Increase hydralazine to 75 mg three times daily Monitor BP at home - call next week with update on BP

## 2016-07-23 NOTE — Patient Instructions (Addendum)
   Medications reviewed and updated.  Changes include increasing the clonidine to 0.3 mg twice daily.   Also increase the hydralazine to 75 mg three times day.   Your prescription(s) have been submitted to your pharmacy. Please take as directed and contact our office if you believe you are having problem(s) with the medication(s).    Call next week with your BP numbers

## 2016-07-23 NOTE — Progress Notes (Signed)
Pre visit review using our clinic review tool, if applicable. No additional management support is needed unless otherwise documented below in the visit note. 

## 2016-07-26 ENCOUNTER — Telehealth: Payer: Self-pay | Admitting: *Deleted

## 2016-07-26 NOTE — Telephone Encounter (Signed)
Called pt back clarified msg below concerning Losartan. He stated that he ended up calling [facility] and spoke w/another pharmacist because he knew the dosage was incorrect. He inform the pharmacist that MD sent in a rx on 05/10/16, Pharmacist was able to pull up new script. Pt was able to take med back and get the correct dosage...Keith [name]

## 2016-07-26 NOTE — Telephone Encounter (Signed)
Pt left msg on triage stating that he suppose to be taking Losartan 100/25 mg, but whn he went to refill his meds pharmacy gave him 100/12.5 mg. Wanting to clarify which dosage to take...Keith [name]

## 2016-08-13 ENCOUNTER — Ambulatory Visit (INDEPENDENT_AMBULATORY_CARE_PROVIDER_SITE_OTHER): Payer: 59 | Admitting: Internal Medicine

## 2016-08-13 ENCOUNTER — Encounter: Payer: Self-pay | Admitting: Internal Medicine

## 2016-08-13 VITALS — BP 122/78 | HR 51 | Temp 97.9°F | Resp 16 | Wt 179.0 lb

## 2016-08-13 DIAGNOSIS — I10 Essential (primary) hypertension: Secondary | ICD-10-CM | POA: Diagnosis not present

## 2016-08-13 MED ORDER — HYDRALAZINE HCL 50 MG PO TABS: 100.0000 mg | ORAL_TABLET | Freq: Three times a day (TID) | ORAL | 3 refills | Status: DC

## 2016-08-13 NOTE — Progress Notes (Signed)
Pre visit review using our clinic review tool, if applicable. No additional management support is needed unless otherwise documented below in the visit note. 

## 2016-08-13 NOTE — Patient Instructions (Signed)
continue to monitor your BP at home.    Medications reviewed and updated.  Changes include increase the hydralazine to 100 mg three times a day.   Your prescription(s) have been submitted to your pharmacy. Please take as directed and contact our office if you believe you are having problem(s) with the medication(s).   Please followup in 6 months

## 2016-08-13 NOTE — Assessment & Plan Note (Signed)
Slightly elevated at home - needs to pass DOT CPE Will increase hydralazine to 100 mg TID Continue other medications Monitor for lightheadedness

## 2016-08-13 NOTE — Progress Notes (Signed)
Subjective:    Patient ID: Keith [name], male    DOB: [birth year], 61 y.o.   MRN: [id number]  HPI He is here for follow up.  Hypertension: He is taking his medication daily, but does forget on occasion. He sometimes drinks too much alcohol at night and forgets to take the medication.  He is compliant with a low sodium diet.  He denies chest pain, palpitations, edema, shortness of breath and regular headaches. He is not exercising regularly.  He does monitor his blood pressure at home and it is still elevated at times.  He is still working on cutting down on his cigarettes.         Medications and allergies reviewed with patient and updated if appropriate.  Patient Active Problem List   Diagnosis Date Noted  . Anxiety and depression 11/09/2015  . S/P lumbar spinal fusion 05/14/2015  . Alcohol abuse 04/23/2015  . DM (diabetes mellitus) type II controlled with renal manifestation ([place]) 04/23/2015  . Lumbar radiculopathy 01/23/2015  . CARPAL TUNNEL SYNDROME 12/22/2009  . HYPERPLASIA PROSTATE UNS W/O UR OBST & OTH LUTS 12/22/2009  . DIVERTICULOSIS, COLON 10/03/2008  . COLONIC POLYPS, HX OF 10/03/2008  . Hyperlipidemia 08/02/2007  . SMOKER 02/06/2007  . Essential hypertension 02/06/2007    Current Outpatient Prescriptions on File Prior to Visit  Medication Sig Dispense Refill  . atorvastatin (LIPITOR) 20 MG tablet Take 1 tablet (20 mg total) by mouth daily. 90 tablet 3  . cloNIDine (CATAPRES) 0.3 MG tablet Take 1 tablet (0.3 mg total) by mouth 2 (two) times daily. 60 tablet 5  . hydrALAZINE (APRESOLINE) 50 MG tablet Take 1.5 tablets (75 mg total) by mouth 3 (three) times daily. 135 tablet 3  . losartan-hydrochlorothiazide (HYZAAR) 100-25 MG tablet Take 1 tablet by mouth daily. 90 tablet 3  . metoprolol tartrate (LOPRESSOR) 25 MG tablet TAKE ONE TABLET BY MOUTH TWICE DAILY 180 tablet 3  . varenicline (CHANTIX CONTINUING MONTH PAK) 1 MG tablet Take 1 tablet (1 mg total) by mouth 2  (two) times daily. 60 tablet 1   No current facility-administered medications on file prior to visit.     Past Medical History:  Diagnosis Date  . Chronic kidney disease 04/23/15   ARF- "resolved"  . Colon polyp   . Dehydration 04/2015   Hospitalized for 4 days after abnormal labs found during preop appointment.  . Diabetes mellitus without complication ([place])    [id number] 04/24/15  . Diverticulosis   . Hypertension   . Tobacco abuse     Past Surgical History:  Procedure Laterality Date  . colonoscopy with polypectomy     polyp X 1  . 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 MAXIMUM ACCESS (MAS)POSTERIOR LUMBAR INTERBODY FUSION (PLIF) 2 LEVEL N/A 05/14/2015   Procedure: Lumbar three-lumbar four,  umbar four-lumbar five Maximum access posterior lumbar interbody fusion;  Surgeon: Eustace Moore, MD;  Location: [facility];  Service: Neurosurgery;  Laterality: N/A;  L3-4 L4-5 Maximum access posterior lumbar interbody fusion  . WISDOM TOOTH EXTRACTION      Social History   Social History  . Marital status: Married    Spouse name: N/A  . Number of children: N/A  . Years of education: N/A   Social History Main Topics  . Smoking status: Current Every Day Smoker    Packs/day: 2.00    Years: 40.00    Types: Cigarettes  Last attempt to quit: 04/19/2015  . Smokeless tobacco: Never Used  . Alcohol use 1.8 oz/week    3 Cans of beer per week     Comment: case/weeked,   . Drug use: No  . Sexual activity: Not Asked   Other Topics Concern  . None   Social History Narrative  . None    Family History  Problem Relation Age of Onset  . Stroke Father   . Melanoma Father   . Stomach cancer Father   . Alcohol abuse Father   . Diabetes Mother   . Breast cancer Sister   . Diabetes Brother   . Colon cancer Neg Hx   . Esophageal cancer Neg Hx   . Rectal cancer Neg Hx     Review of Systems  Constitutional: Negative for fever.  Respiratory: Negative for cough,  shortness of breath and wheezing.   Cardiovascular: Negative for chest pain, palpitations and leg swelling.  Neurological: Negative for light-headedness and headaches.       Objective:   Vitals:   08/13/16 1556  BP: 122/78  Pulse: (!) 51  Resp: 16  Temp: 97.9 F (36.6 C)   Filed Weights   08/13/16 1556  Weight: 179 lb (81.2 kg)   Body mass index is 24.28 kg/m.  Wt Readings from Last 3 Encounters:  08/13/16 179 lb (81.2 kg)  07/23/16 178 lb (80.7 kg)  05/10/16 176 lb (79.8 kg)     Physical Exam Constitutional: Appears well-developed and well-nourished. No distress.  HENT:  Head: Normocephalic and atraumatic.  Neck: Neck supple. No tracheal deviation present. No thyromegaly present.  No cervical lymphadenopathy Cardiovascular: Normal rate, regular rhythm and normal heart sounds.   No murmur heard. No carotid bruit .  No edema Pulmonary/Chest: Effort normal and breath sounds normal. No respiratory distress. No has no wheezes. No rales.  Skin: Skin is warm and dry. Not diaphoretic.  Psychiatric: Normal mood and affect. Behavior is normal.         Assessment & Plan:   See Problem List for Assessment and Plan of chronic medical problems.

## 2016-08-23 ENCOUNTER — Ambulatory Visit (INDEPENDENT_AMBULATORY_CARE_PROVIDER_SITE_OTHER): Payer: 59 | Admitting: Psychology

## 2016-08-23 DIAGNOSIS — F1020 Alcohol dependence, uncomplicated: Secondary | ICD-10-CM

## 2016-08-23 DIAGNOSIS — F4323 Adjustment disorder with mixed anxiety and depressed mood: Secondary | ICD-10-CM

## 2016-09-10 ENCOUNTER — Other Ambulatory Visit: Payer: Self-pay | Admitting: *Deleted

## 2016-09-10 MED ORDER — VARENICLINE TARTRATE 1 MG PO TABS: 1.0000 mg | ORAL_TABLET | Freq: Two times a day (BID) | ORAL | 1 refills | Status: DC

## 2016-09-15 IMAGING — US US ABDOMEN COMPLETE
1 series · 14 of 25 positions shown · non-contrast
Comparison: None.

CLINICAL DATA: Acute renal failure

EXAM:
ULTRASOUND ABDOMEN COMPLETE

[Series 1: us abdomen complete · 0.24mm/px · 14 of 87 slices shown]
[im 1/87]
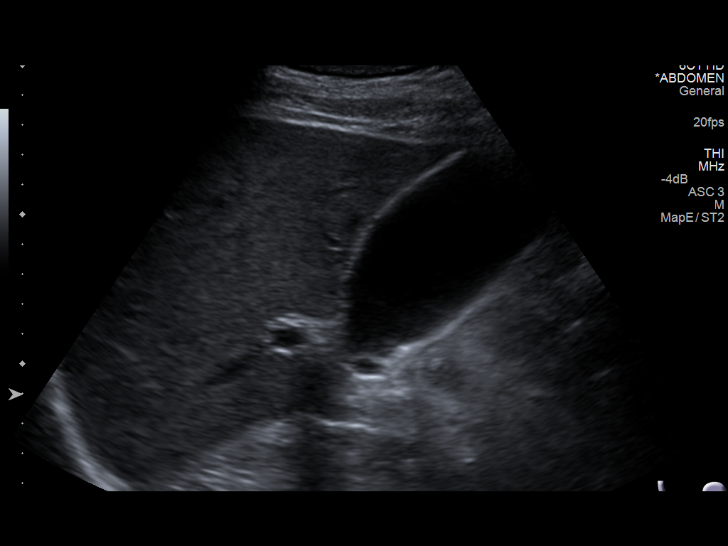
[im 8/87]
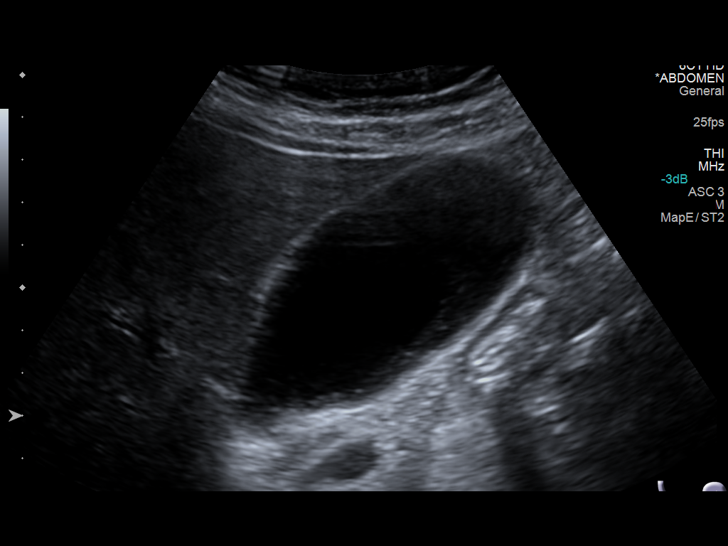
[im 15/87]
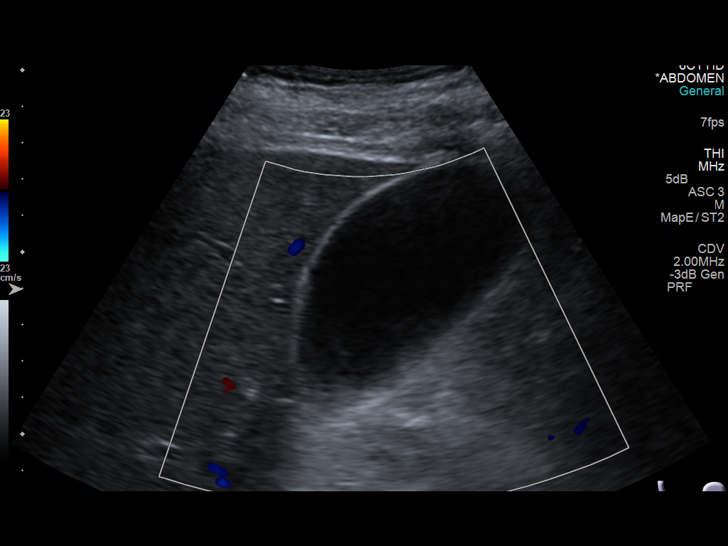
[im 22/87]
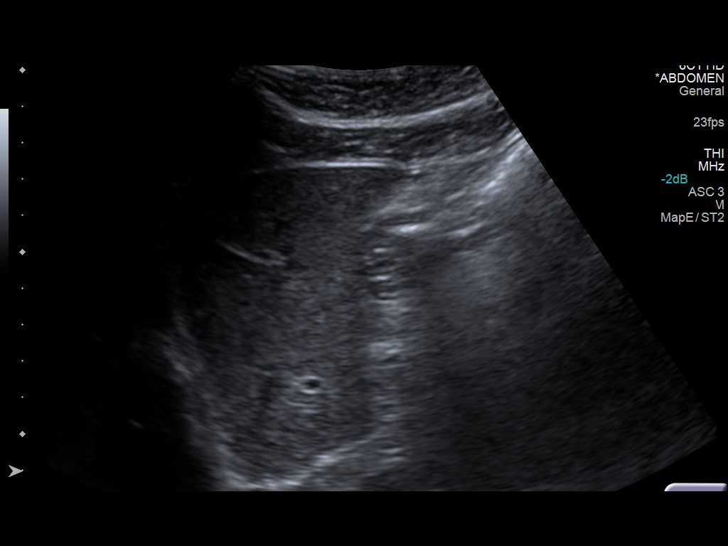
[im 29/87]
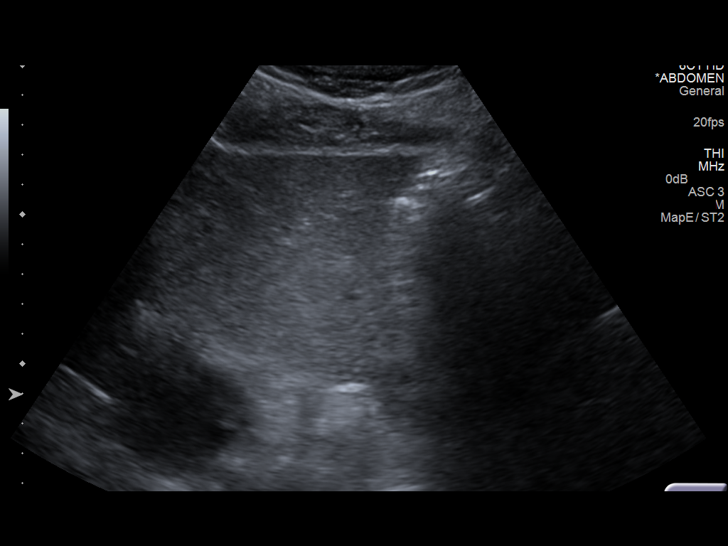
[im 33/87]
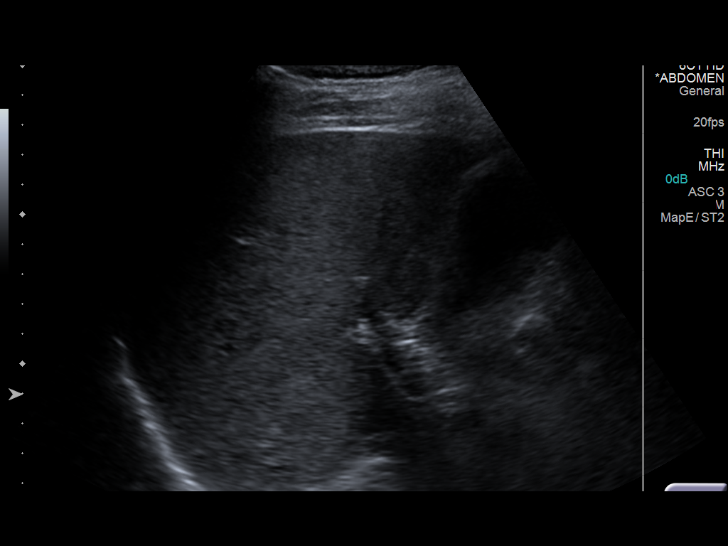
[im 40/87]
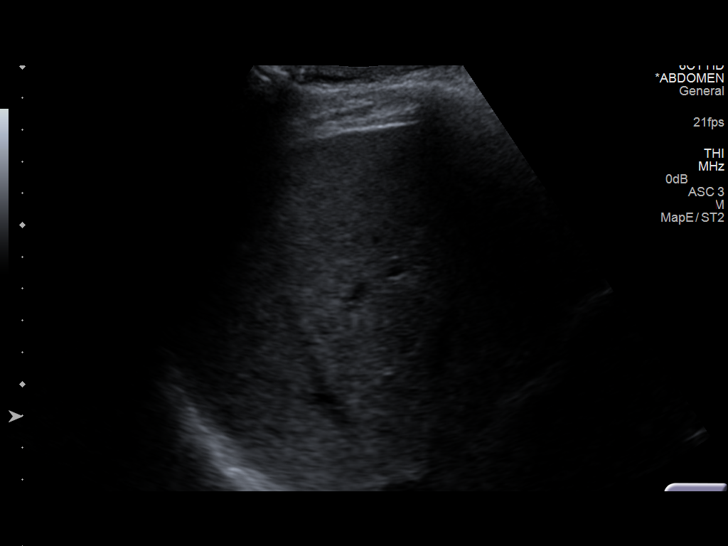
[im 47/87]
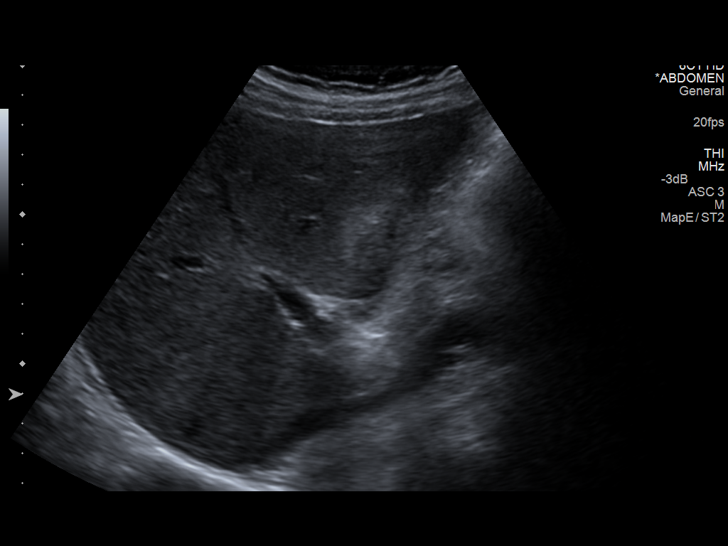
[im 54/87]
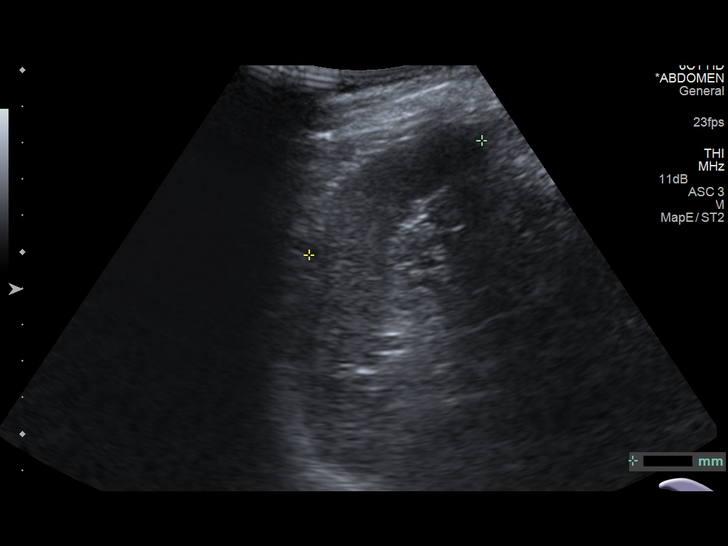
[im 58/87]
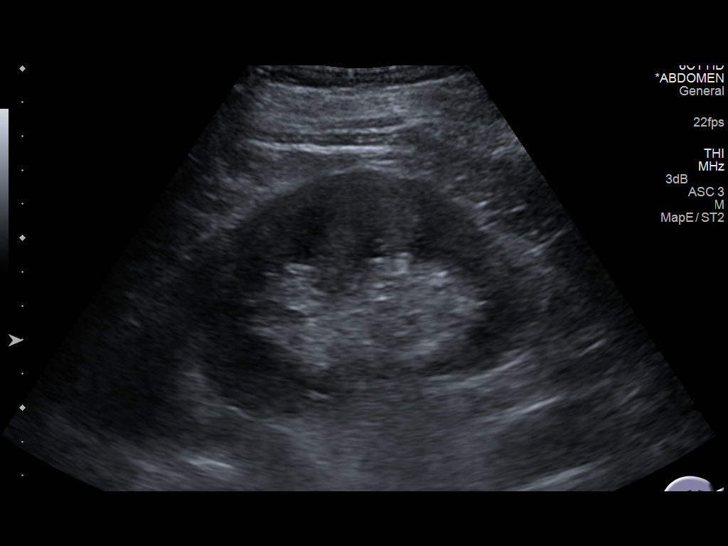
[im 65/87]
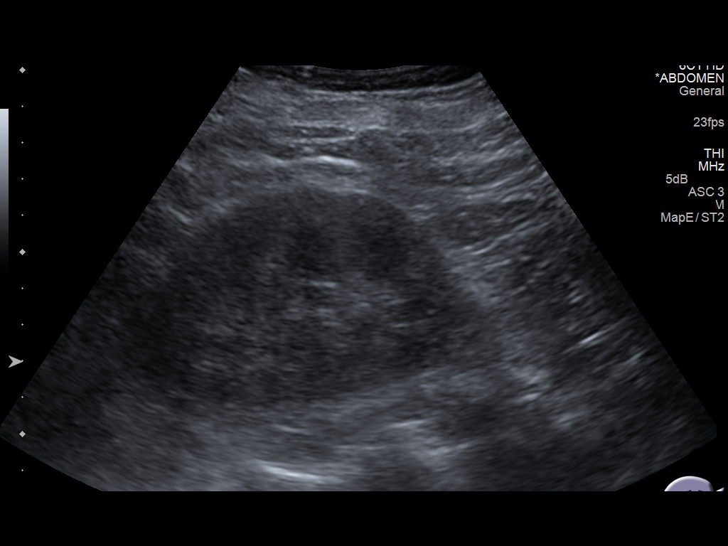
[im 72/87]
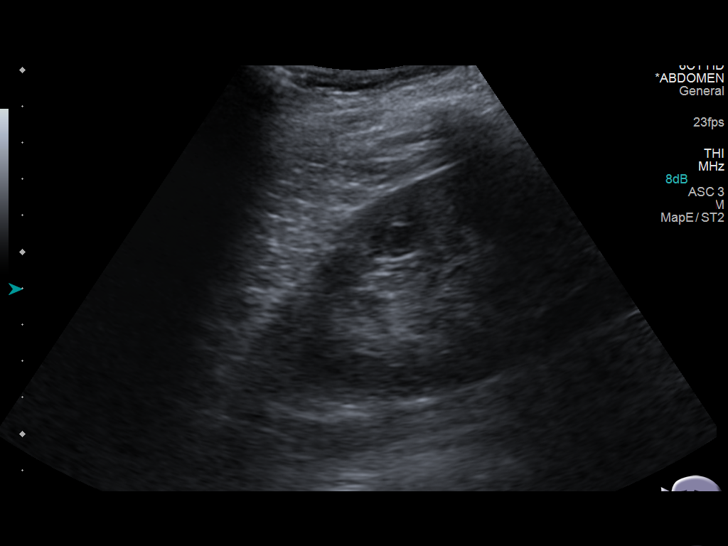
[im 79/87]
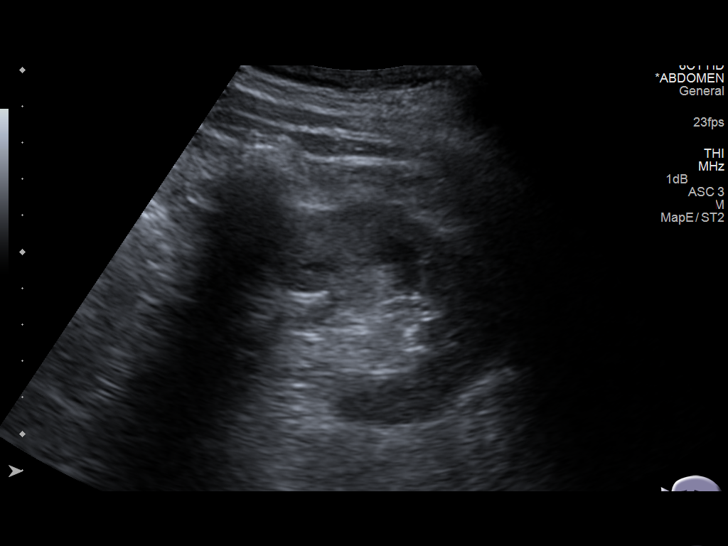
[im 87/87]
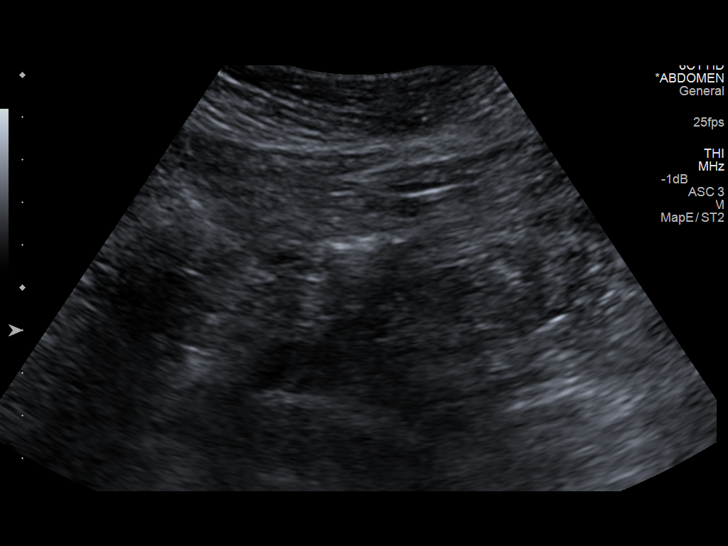

[14 of 25 positions shown; findings below may reference images not displayed]

FINDINGS: Gallbladder: Gallbladder is full and there are internal mid level
echoes layering. No definitive, shadowing stone. No wall thickening
or focal tenderness.

Common bile duct: Diameter: 4 mm. Where visualized, no filling
defect.

Liver: No focal lesion identified. Within normal limits in
parenchymal echogenicity. Antegrade flow in the imaged portal venous
system.

IVC: No abnormality visualized.

Pancreas: Visualized portion unremarkable.

Spleen: Size and appearance within normal limits.

Right Kidney: Length: 11 cm. Echogenicity within normal limits. No
mass or hydronephrosis visualized.

Left Kidney: Length: 12 cm. Echogenicity within normal limits. No
mass or hydronephrosis visualized.

Abdominal aorta: Scattered atherosclerotic wall calcification. No
visualized aneurysm.
IMPRESSION: 1. No hydronephrosis.
2. Gallbladder sludge

## 2016-10-04 ENCOUNTER — Ambulatory Visit (INDEPENDENT_AMBULATORY_CARE_PROVIDER_SITE_OTHER): Payer: 59 | Admitting: Psychology

## 2016-10-04 DIAGNOSIS — F4323 Adjustment disorder with mixed anxiety and depressed mood: Secondary | ICD-10-CM

## 2016-10-04 DIAGNOSIS — F1020 Alcohol dependence, uncomplicated: Secondary | ICD-10-CM

## 2016-11-22 ENCOUNTER — Ambulatory Visit (INDEPENDENT_AMBULATORY_CARE_PROVIDER_SITE_OTHER): Payer: 59 | Admitting: Psychology

## 2016-11-22 DIAGNOSIS — F4323 Adjustment disorder with mixed anxiety and depressed mood: Secondary | ICD-10-CM | POA: Diagnosis not present

## 2016-11-22 DIAGNOSIS — F1020 Alcohol dependence, uncomplicated: Secondary | ICD-10-CM | POA: Diagnosis not present

## 2016-12-26 ENCOUNTER — Other Ambulatory Visit: Payer: Self-pay | Admitting: Internal Medicine

## 2016-12-27 ENCOUNTER — Ambulatory Visit (INDEPENDENT_AMBULATORY_CARE_PROVIDER_SITE_OTHER): Payer: 59 | Admitting: Psychology

## 2016-12-27 DIAGNOSIS — F1020 Alcohol dependence, uncomplicated: Secondary | ICD-10-CM | POA: Diagnosis not present

## 2016-12-27 DIAGNOSIS — F4323 Adjustment disorder with mixed anxiety and depressed mood: Secondary | ICD-10-CM

## 2017-02-07 ENCOUNTER — Ambulatory Visit (INDEPENDENT_AMBULATORY_CARE_PROVIDER_SITE_OTHER): Payer: 59 | Admitting: Psychology

## 2017-02-07 DIAGNOSIS — F4323 Adjustment disorder with mixed anxiety and depressed mood: Secondary | ICD-10-CM

## 2017-02-07 DIAGNOSIS — F1020 Alcohol dependence, uncomplicated: Secondary | ICD-10-CM | POA: Diagnosis not present

## 2017-03-28 ENCOUNTER — Ambulatory Visit (INDEPENDENT_AMBULATORY_CARE_PROVIDER_SITE_OTHER): Payer: 59 | Admitting: Psychology

## 2017-03-28 DIAGNOSIS — F4323 Adjustment disorder with mixed anxiety and depressed mood: Secondary | ICD-10-CM

## 2017-03-28 DIAGNOSIS — F1020 Alcohol dependence, uncomplicated: Secondary | ICD-10-CM

## 2017-04-02 ENCOUNTER — Other Ambulatory Visit: Payer: Self-pay | Admitting: Internal Medicine

## 2017-05-02 ENCOUNTER — Ambulatory Visit (INDEPENDENT_AMBULATORY_CARE_PROVIDER_SITE_OTHER): Payer: 59 | Admitting: Psychology

## 2017-05-02 DIAGNOSIS — F4323 Adjustment disorder with mixed anxiety and depressed mood: Secondary | ICD-10-CM | POA: Diagnosis not present

## 2017-05-02 DIAGNOSIS — F1020 Alcohol dependence, uncomplicated: Secondary | ICD-10-CM | POA: Diagnosis not present

## 2017-05-21 ENCOUNTER — Other Ambulatory Visit: Payer: Self-pay | Admitting: Internal Medicine

## 2017-05-22 DIAGNOSIS — Z23 Encounter for immunization: Secondary | ICD-10-CM | POA: Diagnosis not present

## 2017-05-28 ENCOUNTER — Other Ambulatory Visit: Payer: Self-pay | Admitting: Internal Medicine

## 2017-05-30 MED ORDER — HYDRALAZINE HCL 50 MG PO TABS: 100.0000 mg | ORAL_TABLET | Freq: Three times a day (TID) | ORAL | 0 refills | Status: DC

## 2017-05-30 MED ORDER — CLONIDINE HCL 0.3 MG PO TABS: 0.3000 mg | ORAL_TABLET | Freq: Two times a day (BID) | ORAL | 0 refills | Status: DC

## 2017-05-30 MED ORDER — METOPROLOL TARTRATE 25 MG PO TABS: 25.0000 mg | ORAL_TABLET | Freq: Two times a day (BID) | ORAL | 0 refills | Status: DC

## 2017-05-30 NOTE — Addendum Note (Signed)
Addended by: Earnstine Regal on: 05/30/2017 01:12 PM   Modules accepted: Orders

## 2017-05-30 NOTE — Telephone Encounter (Signed)
Pt called in about all 3 of these refills ?

## 2017-05-30 NOTE — Telephone Encounter (Signed)
Pt made appt for 11/13 with [facility]  Pt would like these sent in today, he will be traveling for work today at [place].  Please advise  POF

## 2017-05-30 NOTE — Telephone Encounter (Signed)
Pt was due for appt in July, pt needs appt schedule before refills can be sent.

## 2017-05-30 NOTE — Telephone Encounter (Signed)
Per office policy sent 30 day to local pharmacy until appt.../lmb  

## 2017-06-06 ENCOUNTER — Encounter: Payer: Self-pay | Admitting: Gastroenterology

## 2017-06-18 ENCOUNTER — Other Ambulatory Visit: Payer: Self-pay | Admitting: Internal Medicine

## 2017-06-23 NOTE — Progress Notes (Signed)
Subjective:    Patient ID: Keith [name], male    DOB: 08/23/1955, 61 y.o.   MRN: [id number]  HPI     Medications and allergies reviewed with patient and updated if appropriate.  Patient Active Problem List   Diagnosis Date Noted  . Anxiety and depression 11/09/2015  . S/P lumbar spinal fusion 05/14/2015  . Alcohol abuse 04/23/2015  . DM (diabetes mellitus) type II controlled with renal manifestation ([place]) 04/23/2015  . Lumbar radiculopathy 01/23/2015  . CARPAL TUNNEL SYNDROME 12/22/2009  . HYPERPLASIA PROSTATE UNS W/O UR OBST & OTH LUTS 12/22/2009  . DIVERTICULOSIS, COLON 10/03/2008  . COLONIC POLYPS, HX OF 10/03/2008  . Hyperlipidemia 08/02/2007  . SMOKER 02/06/2007  . Essential hypertension 02/06/2007    Current Outpatient Medications on File Prior to Visit  Medication Sig Dispense Refill  . atorvastatin (LIPITOR) 20 MG tablet Take 1 tablet (20 mg total) by mouth daily. --- Office visit needed for further refills 30 tablet 0  . cloNIDine (CATAPRES) 0.3 MG tablet Take 1 tablet (0.3 mg total) by mouth 2 (two) times daily. Must keep Nov appt for future refills 60 tablet 0  . hydrALAZINE (APRESOLINE) 50 MG tablet Take 2 tablets (100 mg total) by mouth 3 (three) times daily. Must keep Nov appt for future refills 180 tablet 0  . losartan-hydrochlorothiazide (HYZAAR) 100-25 MG tablet TAKE ONE TABLET BY MOUTH ONCE DAILY 30 tablet 0  . metoprolol tartrate (LOPRESSOR) 25 MG tablet Take 1 tablet (25 mg total) by mouth 2 (two) times daily. Must keep Nov appt for future refills 60 tablet 0  . varenicline (CHANTIX CONTINUING MONTH PAK) 1 MG tablet Take 1 tablet (1 mg total) by mouth 2 (two) times daily. 60 tablet 1   No current facility-administered medications on file prior to visit.     Past Medical History:  Diagnosis Date  . Chronic kidney disease 04/23/15   ARF- "resolved"  . Colon polyp   . Dehydration 04/2015   Hospitalized for 4 days after abnormal labs found during preop  appointment.  . Diabetes mellitus without complication ([place])    W4R 04/24/15  . Diverticulosis   . Hypertension   . Tobacco abuse     Past Surgical History:  Procedure Laterality Date  . colonoscopy with polypectomy     polyp X 1  . 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 MAXIMUM ACCESS (MAS)POSTERIOR LUMBAR INTERBODY FUSION (PLIF) 2 LEVEL N/A 05/14/2015   Procedure: Lumbar three-lumbar four,  umbar four-lumbar five Maximum access posterior lumbar interbody fusion;  Surgeon: Eustace Moore, MD;  Location: [facility];  Service: Neurosurgery;  Laterality: N/A;  L3-4 L4-5 Maximum access posterior lumbar interbody fusion  . WISDOM TOOTH EXTRACTION      Social History   Socioeconomic History  . Marital status: Married    Spouse name: Not on file  . Number of children: Not on file  . Years of education: Not on file  . Highest education level: Not on file  Social Needs  . Financial resource strain: Not on file  . Food insecurity - worry: Not on file  . Food insecurity - inability: Not on file  . Transportation needs - medical: Not on file  . Transportation needs - non-medical: Not on file  Occupational History  . Not on file  Tobacco Use  . Smoking status: Current Every Day Smoker    Packs/day: 2.00    Years:  40.00    Pack years: 80.00    Types: Cigarettes    Last attempt to quit: 04/19/2015    Years since quitting: 2.1  . Smokeless tobacco: Never Used  Substance and Sexual Activity  . Alcohol use: Yes    Alcohol/week: 1.8 oz    Types: 3 Cans of beer per week    Comment: case/weeked,   . Drug use: No  . Sexual activity: Not on file  Other Topics Concern  . Not on file  Social History Narrative  . Not on file    Family History  Problem Relation Age of Onset  . Stroke Father   . Melanoma Father   . Stomach cancer Father   . Alcohol abuse Father   . Diabetes Mother   . Breast cancer Sister   . Diabetes Brother   . Colon cancer Neg Hx   .  Esophageal cancer Neg Hx   . Rectal cancer Neg Hx     Review of Systems     Objective:  There were no vitals filed for this visit. Wt Readings from Last 3 Encounters:  08/13/16 179 lb (81.2 kg)  07/23/16 178 lb (80.7 kg)  05/10/16 176 lb (79.8 kg)   There is no height or weight on file to calculate BMI.   Physical Exam         Assessment & Plan:    See Problem List for Assessment and Plan of chronic medical problems.   This encounter was created in error - please disregard.

## 2017-06-24 ENCOUNTER — Encounter: Payer: Self-pay | Admitting: Internal Medicine

## 2017-06-25 ENCOUNTER — Telehealth: Payer: Self-pay | Admitting: Internal Medicine

## 2017-06-25 DIAGNOSIS — L0291 Cutaneous abscess, unspecified: Secondary | ICD-10-CM | POA: Diagnosis not present

## 2017-06-25 DIAGNOSIS — I10 Essential (primary) hypertension: Secondary | ICD-10-CM | POA: Diagnosis not present

## 2017-06-27 MED ORDER — ATORVASTATIN CALCIUM 20 MG PO TABS: 20.0000 mg | ORAL_TABLET | Freq: Every day | ORAL | 0 refills | Status: DC

## 2017-06-27 NOTE — Telephone Encounter (Signed)
Patient has a 6 month follow up for 11/30.

## 2017-06-27 NOTE — Addendum Note (Signed)
Addended by: Terence Lux B on: 06/27/2017 12:45 PM   Modules accepted: Orders

## 2017-07-04 ENCOUNTER — Ambulatory Visit: Payer: 59 | Admitting: Psychology

## 2017-07-07 NOTE — Progress Notes (Signed)
Subjective:    Patient ID: Keith [name], male    DOB: Dec 16, 1955, 61 y.o.   MRN: [id number]  HPI The patient is here for follow up.  Hypertension: He is taking his medication daily. He is compliant with a low sodium diet.  He denies chest pain, palpitations, edema, shortness of breath and regular headaches. He is exercising regularly -doing some walking+.  He does monitor his blood pressure at home - 115/78, 96/58, 85/55.    Diabetes: He is controlling his sugars with diet.  He does not monitor his sugars at home.Marland Kitchen He is compliant with a diabetic diet. He is exercising regularly.   He is not up-to-date with an ophthalmology examination.   Hyperlipidemia: He is taking his medication daily. He is compliant with a low fat/cholesterol diet. He is exercising regularly. He denies myalgias.    He has not energy or motivation.  He does feel depressed about his relationship with his wife.  He is seeing a therapist which helps. He does not want medication.    He is trying to walk more.    He is still smoking.  He is smoking less.   Next year if his insurance pays for it he wants to try chantix.  He knows he needs to stop.  He is not eating well.   Left wrist:  He has had wrist swelling for several months.  It does not fall asleep on him when he is driving.  The brace helps.  He has decreased strength in the hand/wrist.  He has shooting pain with certain movements.    Chronic lower back pain with intermittent radiation;  He has chronic back pain and it usually hurts most after doing household activities - he has been on tramadol in the past and ideally would like to restart it.  He has had back surgery in the past.  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  Medications and allergies reviewed with patient and updated if appropriate.  Patient Active Problem List   Diagnosis Date Noted  . Anxiety and depression 11/09/2015  . S/P lumbar spinal fusion 05/14/2015  . Alcohol abuse  04/23/2015  . DM (diabetes mellitus) type II controlled with renal manifestation ([place]) 04/23/2015  . Lumbar radiculopathy 01/23/2015  . CARPAL TUNNEL SYNDROME 12/22/2009  . HYPERPLASIA PROSTATE UNS W/O UR OBST & OTH LUTS 12/22/2009  . DIVERTICULOSIS, COLON 10/03/2008  . COLONIC POLYPS, HX OF 10/03/2008  . Hyperlipidemia 08/02/2007  . SMOKER 02/06/2007  . Essential hypertension 02/06/2007    Current Outpatient Medications on File Prior to Visit  Medication Sig Dispense Refill  . atorvastatin (LIPITOR) 20 MG tablet Take 1 tablet (20 mg total) daily by mouth. --- Must come to appt for further refills 30 tablet 0  . cloNIDine (CATAPRES) 0.3 MG tablet Take 1 tablet (0.3 mg total) by mouth 2 (two) times daily. Must keep Nov appt for future refills 60 tablet 0  . hydrALAZINE (APRESOLINE) 50 MG tablet Take 2 tablets (100 mg total) by mouth 3 (three) times daily. Must keep Nov appt for future refills 180 tablet 0  . losartan-hydrochlorothiazide (HYZAAR) 100-25 MG tablet TAKE ONE TABLET BY MOUTH ONCE DAILY 30 tablet 0  . metoprolol tartrate (LOPRESSOR) 25 MG tablet Take 1 tablet (25 mg total) by mouth 2 (two) times daily. Must keep Nov appt for future refills 60 tablet 0   No current facility-administered medications on file prior to visit.  Past Medical History:  Diagnosis Date  . Chronic kidney disease 04/23/15   ARF- "resolved"  . Colon polyp   . Dehydration 04/2015   Hospitalized for 4 days after abnormal labs found during preop appointment.  . Diabetes mellitus without complication ([place])    J1H 04/24/15  . Diverticulosis   . Hypertension   . Tobacco abuse     Past Surgical History:  Procedure Laterality Date  . colonoscopy with polypectomy     polyp X 1  . 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 MAXIMUM ACCESS (MAS)POSTERIOR LUMBAR INTERBODY FUSION (PLIF) 2 LEVEL N/A 05/14/2015   Procedure: Lumbar three-lumbar four,  umbar four-lumbar five Maximum access  posterior lumbar interbody fusion;  Surgeon: Eustace Moore, MD;  Location: [facility];  Service: Neurosurgery;  Laterality: N/A;  L3-4 L4-5 Maximum access posterior lumbar interbody fusion  . WISDOM TOOTH EXTRACTION      Social History   Socioeconomic History  . Marital status: Married    Spouse name: None  . Number of children: None  . Years of education: None  . Highest education level: None  Social Needs  . Financial resource strain: None  . Food insecurity - worry: None  . Food insecurity - inability: None  . Transportation needs - medical: None  . Transportation needs - non-medical: None  Occupational History  . None  Tobacco Use  . Smoking status: Current Every Day Smoker    Packs/day: 2.00    Years: 40.00    Pack years: 80.00    Types: Cigarettes    Last attempt to quit: 04/19/2015    Years since quitting: 2.2  . Smokeless tobacco: Never Used  Substance and Sexual Activity  . Alcohol use: Yes    Alcohol/week: 1.8 oz    Types: 3 Cans of beer per week    Comment: case/weeked,   . Drug use: No  . Sexual activity: None  Other Topics Concern  . None  Social History Narrative  . None    Family History  Problem Relation Age of Onset  . Stroke Father   . Melanoma Father   . Stomach cancer Father   . Alcohol abuse Father   . Diabetes Mother   . Breast cancer Sister   . Diabetes Brother   . Colon cancer Neg Hx   . Esophageal cancer Neg Hx   . Rectal cancer Neg Hx     Review of Systems  Constitutional: Negative for chills and fever.  Eyes: Positive for visual disturbance (occ fuzzy vision).  Respiratory: Positive for cough. Negative for shortness of breath and wheezing.   Cardiovascular: Negative for chest pain, palpitations and leg swelling.  Gastrointestinal:       Occ GERD  Musculoskeletal: Positive for back pain. Negative for myalgias.  Neurological: Positive for light-headedness (occ). Negative for headaches.       Objective:   Vitals:    07/08/17 1100  BP: 120/66  Pulse: 60  Resp: 16  Temp: 97.9 F (36.6 C)  SpO2: 97%   Wt Readings from Last 3 Encounters:  07/08/17 178 lb (80.7 kg)  08/13/16 179 lb (81.2 kg)  07/23/16 178 lb (80.7 kg)   Body mass index is 24.14 kg/m.   Physical Exam    Constitutional: Appears well-developed and well-nourished. No distress.  HENT:  Head: Normocephalic and atraumatic.  Neck: Neck supple. No tracheal deviation present. No thyromegaly present.  No cervical lymphadenopathy  Cardiovascular: Normal rate, regular rhythm and normal heart sounds.   No murmur heard. No carotid bruit .  No edema Pulmonary/Chest: Effort normal and breath sounds normal. No respiratory distress. No has no wheezes. No rales.  Skin: Skin is warm and dry. Not diaphoretic.  Psychiatric: Normal mood and affect. Behavior is normal.      Assessment & Plan:    See Problem List for Assessment and Plan of chronic medical problems.

## 2017-07-07 NOTE — Patient Instructions (Addendum)
  Test(s) ordered today. Your results will be released to [facility] (or called to you) after review, usually within 72hours after test completion. If any changes need to be made, you will be notified at that same time.  All other Health Maintenance issues reviewed.   All recommended immunizations and age-appropriate screenings are up-to-date or discussed.  Pneumovax immunization administered today.   Medications reviewed and updated.  Changes include continue the hydralazine at twice a day and decrease the clonidine to 0.2 mg twice daily.   Your prescription(s) have been submitted to your pharmacy. Please take as directed and contact our office if you believe you are having problem(s) with the medication(s).  Please followup in 6 months

## 2017-07-08 ENCOUNTER — Other Ambulatory Visit (INDEPENDENT_AMBULATORY_CARE_PROVIDER_SITE_OTHER): Payer: 59

## 2017-07-08 ENCOUNTER — Encounter: Payer: Self-pay | Admitting: Internal Medicine

## 2017-07-08 ENCOUNTER — Ambulatory Visit: Payer: 59 | Admitting: Internal Medicine

## 2017-07-08 VITALS — BP 120/66 | HR 60 | Temp 97.9°F | Resp 16 | Wt 178.0 lb

## 2017-07-08 DIAGNOSIS — F329 Major depressive disorder, single episode, unspecified: Secondary | ICD-10-CM

## 2017-07-08 DIAGNOSIS — M5416 Radiculopathy, lumbar region: Secondary | ICD-10-CM

## 2017-07-08 DIAGNOSIS — I10 Essential (primary) hypertension: Secondary | ICD-10-CM

## 2017-07-08 DIAGNOSIS — Z23 Encounter for immunization: Secondary | ICD-10-CM | POA: Diagnosis not present

## 2017-07-08 DIAGNOSIS — E1122 Type 2 diabetes mellitus with diabetic chronic kidney disease: Secondary | ICD-10-CM | POA: Diagnosis not present

## 2017-07-08 DIAGNOSIS — F32A Depression, unspecified: Secondary | ICD-10-CM

## 2017-07-08 DIAGNOSIS — F419 Anxiety disorder, unspecified: Secondary | ICD-10-CM | POA: Diagnosis not present

## 2017-07-08 DIAGNOSIS — E7849 Other hyperlipidemia: Secondary | ICD-10-CM

## 2017-07-08 DIAGNOSIS — M25532 Pain in left wrist: Secondary | ICD-10-CM | POA: Insufficient documentation

## 2017-07-08 LAB — LIPID PANEL
CHOL/HDL RATIO: 5
CHOLESTEROL: 139 mg/dL (ref 0–200)
HDL: 29.9 mg/dL — AB (ref >39.00)
LDL CALC: 91 mg/dL (ref 0–99)
NonHDL: 109.42
TRIGLYCERIDES: 90 mg/dL (ref 0.0–149.0)
VLDL: 18 mg/dL (ref 0.0–40.0)

## 2017-07-08 LAB — COMPREHENSIVE METABOLIC PANEL
ALBUMIN: 4.3 g/dL (ref 3.5–5.2)
ALK PHOS: 61 U/L (ref 39–117)
ALT: 22 U/L (ref 0–53)
AST: 20 U/L (ref 0–37)
BUN: 15 mg/dL (ref 6–23)
CALCIUM: 9.7 mg/dL (ref 8.4–10.5)
CO2: 29 mEq/L (ref 19–32)
Chloride: 98 mEq/L (ref 96–112)
Creatinine, Ser: 1.16 mg/dL (ref 0.40–1.50)
GFR: 67.48 mL/min (ref >60.00)
Glucose, Bld: 118 mg/dL — ABNORMAL HIGH (ref 70–99)
POTASSIUM: 4.5 meq/L (ref 3.5–5.1)
SODIUM: 131 meq/L — AB (ref 135–145)
TOTAL PROTEIN: 7.4 g/dL (ref 6.0–8.3)
Total Bilirubin: 0.4 mg/dL (ref 0.2–1.2)

## 2017-07-08 LAB — HEMOGLOBIN A1C: Hgb A1c MFr Bld: 6.5 % (ref 4.6–6.5)

## 2017-07-08 MED ORDER — ATORVASTATIN CALCIUM 20 MG PO TABS: 20.0000 mg | ORAL_TABLET | Freq: Every day | ORAL | 1 refills | Status: DC

## 2017-07-08 MED ORDER — CLONIDINE HCL 0.2 MG PO TABS: 0.2000 mg | ORAL_TABLET | Freq: Two times a day (BID) | ORAL | 1 refills | Status: DC

## 2017-07-08 MED ORDER — LOSARTAN POTASSIUM-HCTZ 100-25 MG PO TABS: 1.0000 | ORAL_TABLET | Freq: Every day | ORAL | 1 refills | Status: DC

## 2017-07-08 MED ORDER — TRAMADOL HCL 50 MG PO TABS: 50.0000 mg | ORAL_TABLET | Freq: Two times a day (BID) | ORAL | 0 refills | Status: DC | PRN

## 2017-07-08 MED ORDER — METOPROLOL TARTRATE 25 MG PO TABS: 25.0000 mg | ORAL_TABLET | Freq: Two times a day (BID) | ORAL | 1 refills | Status: DC

## 2017-07-08 MED ORDER — HYDRALAZINE HCL 50 MG PO TABS: 100.0000 mg | ORAL_TABLET | Freq: Two times a day (BID) | ORAL | 5 refills | Status: DC

## 2017-07-08 NOTE — Assessment & Plan Note (Addendum)
S/p surgery Chronic back pain with radiation down left leg Intermittent pain Will prescribe tramadol for household activites - use sparingly Advised that he cannot drink alcohol while taking tramadol

## 2017-07-08 NOTE — Assessment & Plan Note (Signed)
Persistent left wrist pain, swelling and weakness Wrist brace has caused some improvement, but still having symptoms Refer to Dr. Tamala Julian for further evaluation

## 2017-07-08 NOTE — Assessment & Plan Note (Signed)
Diet controlled Check a1c Low sugar / carb diet Stressed regular exercise  

## 2017-07-08 NOTE — Assessment & Plan Note (Signed)
Check lipid panel  Continue daily statin Regular exercise and healthy diet encouraged  

## 2017-07-08 NOTE — Assessment & Plan Note (Signed)
Does not want medication Seeing a therapist Trying to increase his exercise

## 2017-07-08 NOTE — Assessment & Plan Note (Signed)
Well controlled here, low at home Will dec clonidine to 0.2 mg bid - will titrate lower if tolerated Continue other meds - only taking hydralazine BID cmp

## 2017-08-05 ENCOUNTER — Ambulatory Visit: Payer: Self-pay

## 2017-08-05 ENCOUNTER — Encounter: Payer: Self-pay | Admitting: Family Medicine

## 2017-08-05 ENCOUNTER — Ambulatory Visit: Payer: 59 | Admitting: Family Medicine

## 2017-08-05 VITALS — BP 160/84 | HR 71 | Ht 72.0 in | Wt 184.0 lb

## 2017-08-05 DIAGNOSIS — Z0100 Encounter for examination of eyes and vision without abnormal findings: Secondary | ICD-10-CM | POA: Diagnosis not present

## 2017-08-05 DIAGNOSIS — M25532 Pain in left wrist: Secondary | ICD-10-CM | POA: Diagnosis not present

## 2017-08-05 DIAGNOSIS — M19032 Primary osteoarthritis, left wrist: Secondary | ICD-10-CM | POA: Diagnosis not present

## 2017-08-05 LAB — HM DIABETES EYE EXAM

## 2017-08-05 NOTE — Progress Notes (Signed)
[facility] [address], [place] [address] Phone: [phone] Subjective:    I'm seeing this patient by the request  of:  Binnie Rail, MD   CC: Left wrist pain  [id number]  Keith [name] is a 61 y.o. male coming in with complaint of left wrist pain.  Been going on several months.  Patient was found to have potential carpal tunnel and has been wearing a brace which was helping.  Patient though had been noticing a decreased strength in the hand and wrist with some sharp shooting pain with certain movements. Numbness and tingling in the fingers.  Onset- 8 months ago States that it is more aggravating than painful.  States that the numbness in the sensation seems to be the more irritating factors. Aggravating factors-  Reliving factors-change in the position or use in the brace that he was given previously. Therapies tried-only bracing      Past Medical History:  Diagnosis Date  . Chronic kidney disease 04/23/15   ARF- "resolved"  . Colon polyp   . Dehydration 04/2015   Hospitalized for 4 days after abnormal labs found during preop appointment.  . Diabetes mellitus without complication ([place])    Q6V 04/24/15  . Diverticulosis   . Hypertension   . Tobacco abuse    Past Surgical History:  Procedure Laterality Date  . colonoscopy with polypectomy     polyp X 1  . 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 MAXIMUM ACCESS (MAS)POSTERIOR LUMBAR INTERBODY FUSION (PLIF) 2 LEVEL N/A 05/14/2015   Procedure: Lumbar three-lumbar four,  umbar four-lumbar five Maximum access posterior lumbar interbody fusion;  Surgeon: Eustace Moore, MD;  Location: [facility];  Service: Neurosurgery;  Laterality: N/A;  L3-4 L4-5 Maximum access posterior lumbar interbody fusion  . WISDOM TOOTH EXTRACTION     Social History   Socioeconomic History  . Marital status: Married    Spouse name: Not on file  . Number of children: Not on file  .  Years of education: Not on file  . Highest education level: Not on file  Social Needs  . Financial resource strain: Not on file  . Food insecurity - worry: Not on file  . Food insecurity - inability: Not on file  . Transportation needs - medical: Not on file  . Transportation needs - non-medical: Not on file  Occupational History  . Not on file  Tobacco Use  . Smoking status: Current Every Day Smoker    Packs/day: 2.00    Years: 40.00    Pack years: 80.00    Types: Cigarettes    Last attempt to quit: 04/19/2015    Years since quitting: 2.2  . Smokeless tobacco: Never Used  Substance and Sexual Activity  . Alcohol use: Yes    Alcohol/week: 1.8 oz    Types: 3 Cans of beer per week    Comment: case/weeked,   . Drug use: No  . Sexual activity: Not on file  Other Topics Concern  . Not on file  Social History Narrative  . Not on file   Allergies  Allergen Reactions  . Bupropion Hcl     REACTION: rash @ pressure areas (waist, groin , axillae)  . Penicillins     ? Rash , hives  . Amoxicillin Itching and Rash   Family History  Problem Relation Age of Onset  . Stroke Father   .  Melanoma Father   . Stomach cancer Father   . Alcohol abuse Father   . Diabetes Mother   . Breast cancer Sister   . Diabetes Brother   . Colon cancer Neg Hx   . Esophageal cancer Neg Hx   . Rectal cancer Neg Hx      Past medical history, social, surgical and family history all reviewed in electronic medical record.  No pertanent information unless stated regarding to the chief complaint.   Review of Systems:Review of systems updated and as accurate as of 08/05/17  No headache, visual changes, nausea, vomiting, diarrhea, constipation, dizziness, abdominal pain, skin rash, fevers, chills, night sweats, weight loss, swollen lymph nodes, body aches, joint swelling, muscle aches, chest pain, shortness of breath, mood changes.   Objective  There were no vitals taken for this visit. Systems examined  below as of 08/05/17   General: No apparent distress alert and oriented x3 mood and affect normal, dressed appropriately.  HEENT: Pupils equal, extraocular movements intact  Respiratory: Patient's speak in full sentences and does not appear short of breath  Cardiovascular: No lower extremity edema, non tender, no erythema  Skin: Warm dry intact with no signs of infection or rash on extremities or on axial skeleton.  Abdomen: Soft nontender  Neuro: Cranial nerves II through XII are intact, neurovascularly intact in all extremities with 2+ DTRs and 2+ pulses.  Lymph: No lymphadenopathy of posterior or anterior cervical chain or axillae bilaterally.  Gait normal with good balance and coordination.  MSK:  Non tender with full range of motion and good stability and symmetric strength and tone of shoulders, elbows, hip, knee and ankles bilaterally.  Wrist: Left Inspection normal with no visible erythema or swelling. Mild swelling over the TFCC dorsally.  Minimal pain in this area.  Patient does have some mild limitation in range of motion lacking the last 10 degrees of extension No snuffbox tenderness. No tenderness over Canal of [place]. Strength 5/5 in all directions without pain. Negative Finkelstein, tinel's and phalens. Negative Watson's test.  MSK US performed of: Left wrist This study was ordered, performed, and interpreted by Charlann Boxer D.O.  Wrist: Extensor compartment dorsal #3 does have significant arthritic changes.  Patient also has what appears to be a reactive tenosynovitis.  Patient does have  moderate arthritic changes.  Especially at the scaphoid lunate joint  IMPRESSION: TFCC irritation with osteoarthritic changes.     Impression and Recommendations:     This case required medical decision making of moderate complexity.      Note: This dictation was prepared with Dragon dictation along with smaller phrase technology. Any transcriptional errors that result from this  process are unintentional.

## 2017-08-05 NOTE — Patient Instructions (Signed)
Good ot see you  Ice 20 minutes 2 times daily. Usually after activity and before bed. Wear the brace at night Over the counter get  Arnica lotion and apply topically 2 times a day  Vitamin D 2000 IU daily  Iron 65mg  with 500mg  of vitamin C If not much better see me again in 4 weeks

## 2017-08-05 NOTE — Assessment & Plan Note (Signed)
Moderate arthritis of the wrist.  Patient does have some mild limitation in range of motion.  No new believe that there is a remote history of a TFCC tear as well.  Patient has elected to try conservative therapy including bracing at night, topical anti-inflammatories, icing regimen, we discussed over-the-counter medications.  Given home exercises.  Patient will follow up with me again in 4 weeks.  Worsening symptoms we will consider formal physical therapy and injections.

## 2017-09-04 NOTE — Progress Notes (Signed)
[facility] [address] [place], [place] [address] Phone: [phone] Subjective:     CC: Left wrist pain follow-up  JIR:[id number]  Keith [name] is a 62 y.o. male coming in with complaint of left wrist pain.-Seen 1 month ago.  Was diagnosed with some arthritis of the left wrist as well as what appeared to have a degenerative tear of the TFCC.  Patient did have reactive tenosynovitis as well.  Patient was to do bracing, topical anti-inflammatories, icing regimen and over-the-counter medications.  Patient states that he took a few doses of the vitamins but has not been wearing the brace at night. Patient continues to have pain and believes that there isn't likely anything that can be done unless he has surgery.       Past Medical History:  Diagnosis Date  . Chronic kidney disease 04/23/15   ARF- "resolved"  . Colon polyp   . Dehydration 04/2015   Hospitalized for 4 days after abnormal labs found during preop appointment.  . Diabetes mellitus without complication ([place])    P1W 04/24/15  . Diverticulosis   . Hypertension   . Tobacco abuse    Past Surgical History:  Procedure Laterality Date  . colonoscopy with polypectomy     polyp X 1  . 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 MAXIMUM ACCESS (MAS)POSTERIOR LUMBAR INTERBODY FUSION (PLIF) 2 LEVEL N/A 05/14/2015   Procedure: Lumbar three-lumbar four,  umbar four-lumbar five Maximum access posterior lumbar interbody fusion;  Surgeon: Eustace Moore, MD;  Location: [facility];  Service: Neurosurgery;  Laterality: N/A;  L3-4 L4-5 Maximum access posterior lumbar interbody fusion  . WISDOM TOOTH EXTRACTION     Social History   Socioeconomic History  . Marital status: Married    Spouse name: None  . Number of children: None  . Years of education: None  . Highest education level: None  Social Needs  . Financial resource strain: None  . Food insecurity - worry: None  . Food  insecurity - inability: None  . Transportation needs - medical: None  . Transportation needs - non-medical: None  Occupational History  . None  Tobacco Use  . Smoking status: Current Every Day Smoker    Packs/day: 2.00    Years: 40.00    Pack years: 80.00    Types: Cigarettes    Last attempt to quit: 04/19/2015    Years since quitting: 2.3  . Smokeless tobacco: Never Used  Substance and Sexual Activity  . Alcohol use: Yes    Alcohol/week: 1.8 oz    Types: 3 Cans of beer per week    Comment: case/weeked,   . Drug use: No  . Sexual activity: None  Other Topics Concern  . None  Social History Narrative  . None   Allergies  Allergen Reactions  . Bupropion Hcl     REACTION: rash @ pressure areas (waist, groin , axillae)  . Penicillins     ? Rash , hives  . Amoxicillin Itching and Rash   Family History  Problem Relation Age of Onset  . Stroke Father   . Melanoma Father   . Stomach cancer Father   . Alcohol abuse Father   . Diabetes Mother   . Breast cancer Sister   . Diabetes Brother   . Colon cancer Neg Hx   . Esophageal cancer Neg Hx   . Rectal cancer Neg Hx  Past medical history, social, surgical and family history all reviewed in electronic medical record.  No pertanent information unless stated regarding to the chief complaint.   Review of Systems:Review of systems updated and as accurate as of 09/05/17  No headache, visual changes, nausea, vomiting, diarrhea, constipation, dizziness, abdominal pain, skin rash, fevers, chills, night sweats, weight loss, swollen lymph nodes, body aches, joint swelling, , chest pain, shortness of breath, mood changes.  Positive muscle aches  Objective  Blood pressure (!) 158/86, pulse (!) 59, height 6' (1.829 m), weight 178 lb (80.7 kg), SpO2 98 %. Systems examined below as of 09/05/17   General: No apparent distress alert and oriented x3 mood and affect normal, dressed appropriately.  HEENT: Pupils equal, extraocular  movements intact  Respiratory: Patient's speak in full sentences and does not appear short of breath  Cardiovascular: No lower extremity edema, non tender, no erythema  Skin: Warm dry intact with no signs of infection or rash on extremities or on axial skeleton.  Abdomen: Soft nontender  Neuro: Cranial nerves II through XII are intact, neurovascularly intact in all extremities with 2+ DTRs and 2+ pulses.  Lymph: No lymphadenopathy of posterior or anterior cervical chain or axillae bilaterally.  Gait normal with good balance and coordination.  MSK:  Non tender with full range of motion and good stability and symmetric strength and tone of shoulders, elbows,  hip, knee and ankles bilaterally.  Wrist: Left Inspection normal with no visible erythema . ROM smooth and normal with good flexion and extension and ulnar/radial deviation that is symmetrical with opposite wrist. Positive pain over the TFCC mild swelling over that area as well No snuffbox tenderness. No tenderness over Canal of Guyon. Strength 5/5 in all directions without pain. Negative Finkelstein, tinel's and phalens. Mild positive Watson's test. Contralateral wrist unremarkable  Procedure: Real-time Ultrasound Guided Injection of left TFCC Device: GE Logiq Q7 Ultrasound guided injection is preferred based studies that show increased duration, increased effect, greater accuracy, decreased procedural pain, increased response rate, and decreased cost with ultrasound guided versus blind injection.  Verbal informed consent obtained.  Time-out conducted.  Noted no overlying erythema, induration, or other signs of local infection.  Skin prepped in a sterile fashion.  Local anesthesia: Topical Ethyl chloride.  With sterile technique and under real time ultrasound guidance: With a 25-gauge half inch needle patient was injected with 0.5 cc of 0.5% Marcaine and 0.5 cc of Kenalog 40 mg/mL into the TFCC Completed without difficulty  Pain  immediately resolved suggesting accurate placement of the medication.  Advised to call if fevers/chills, erythema, induration, drainage, or persistent bleeding.  Images permanently stored and available for review in the ultrasound unit.  Impression: Technically successful ultrasound guided injection.    Impression and Recommendations:     This case required medical decision making of moderate complexity.      Note: This dictation was prepared with Dragon dictation along with smaller phrase technology. Any transcriptional errors that result from this process are unintentional.       '

## 2017-09-05 ENCOUNTER — Ambulatory Visit: Payer: Self-pay

## 2017-09-05 ENCOUNTER — Ambulatory Visit: Payer: 59 | Admitting: Family Medicine

## 2017-09-05 ENCOUNTER — Encounter: Payer: Self-pay | Admitting: Family Medicine

## 2017-09-05 ENCOUNTER — Other Ambulatory Visit (INDEPENDENT_AMBULATORY_CARE_PROVIDER_SITE_OTHER): Payer: 59

## 2017-09-05 VITALS — BP 158/86 | HR 59 | Ht 72.0 in | Wt 178.0 lb

## 2017-09-05 DIAGNOSIS — M130 Polyarthritis, unspecified: Secondary | ICD-10-CM

## 2017-09-05 DIAGNOSIS — M2550 Pain in unspecified joint: Secondary | ICD-10-CM

## 2017-09-05 DIAGNOSIS — S6982XA Other specified injuries of left wrist, hand and finger(s), initial encounter: Secondary | ICD-10-CM | POA: Diagnosis not present

## 2017-09-05 DIAGNOSIS — M25532 Pain in left wrist: Secondary | ICD-10-CM

## 2017-09-05 LAB — IBC PANEL
Iron: 106 ug/dL (ref 42–165)
SATURATION RATIOS: 27.5 % (ref 20.0–50.0)
Transferrin: 275 mg/dL (ref 212.0–360.0)

## 2017-09-05 LAB — VITAMIN D 25 HYDROXY (VIT D DEFICIENCY, FRACTURES): VITD: 37.71 ng/mL (ref 30.00–100.00)

## 2017-09-05 LAB — SEDIMENTATION RATE: Sed Rate: 5 mm/hr (ref 0–20)

## 2017-09-05 LAB — URIC ACID: URIC ACID, SERUM: 4.5 mg/dL (ref 4.0–7.8)

## 2017-09-05 NOTE — Assessment & Plan Note (Signed)
Patient given an injection. Patient going to do icing regimen if continued have pain possibly advanced imaging would be warranted.  Patient will follow up with me again in 4 weeks

## 2017-09-05 NOTE — Patient Instructions (Signed)
Good to see you  Injected the TFCC today and hope it helps Ice is your friend.  Exercises are key Labs today  Travel safe See me again in 4 weeks

## 2017-09-05 NOTE — Assessment & Plan Note (Signed)
Results of polyarthropathy.  Laboratory workup ordered today.  Does have many different reasons to have it including patient's diabetes as well as history of alcohol abuse.  Also underlying anxiety depression.  Also with increased stress at the house with his wife's health.  Patient will follow up with me again after laboratory workup to discuss.

## 2017-09-06 LAB — PTH, INTACT AND CALCIUM
CALCIUM: 9.9 mg/dL (ref 8.6–10.3)
PTH: 20 pg/mL (ref 14–64)

## 2017-09-06 LAB — RHEUMATOID FACTOR: Rhuematoid fact SerPl-aCnc: <14 IU/mL (ref <14)

## 2017-09-06 LAB — ANA: Anti Nuclear Antibody(ANA): NEGATIVE

## 2017-09-06 LAB — CYCLIC CITRUL PEPTIDE ANTIBODY, IGG

## 2017-10-01 NOTE — Progress Notes (Signed)
[name] [place], [place] [address] Phone: [phone] Subjective:     CC: Left wrist pain  Keith [name]  Keith [name] is a 62 y.o. male coming in with complaint of left wrist pain.  Patient was found to be a TFCC tear.  Given injection 1 month ago.  Patient was to try some home exercises and icing.  Was having increasing stress in his life.  Patient states doing better overall.  Patient is a 60-70% better.  States that 3 days after the injection he had almost complete range of motion and minimal pain.  Patient states that it is slowly worsening again.     Past Medical History:  Diagnosis Date  . Chronic kidney disease 04/23/15   ARF- "resolved"  . Colon polyp   . Dehydration 04/2015   Hospitalized for 4 days after abnormal labs found during preop appointment.  . Diabetes mellitus without complication ([place])    X3A 04/24/15  . Diverticulosis   . Hypertension   . Tobacco abuse    Past Surgical History:  Procedure Laterality Date  . colonoscopy with polypectomy     polyp X 1  . 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 MAXIMUM ACCESS (MAS)POSTERIOR LUMBAR INTERBODY FUSION (PLIF) 2 LEVEL N/A 05/14/2015   Procedure: Lumbar three-lumbar four,  umbar four-lumbar five Maximum access posterior lumbar interbody fusion;  Surgeon: Eustace Moore, MD;  Location: [facility];  Service: Neurosurgery;  Laterality: N/A;  L3-4 L4-5 Maximum access posterior lumbar interbody fusion  . WISDOM TOOTH EXTRACTION     Social History   Socioeconomic History  . Marital status: Married    Spouse name: None  . Number of children: None  . Years of education: None  . Highest education level: None  Social Needs  . Financial resource strain: None  . Food insecurity - worry: None  . Food insecurity - inability: None  . Transportation needs - medical: None  . Transportation needs - non-medical: None  Occupational History  . None    Tobacco Use  . Smoking status: Current Every Day Smoker    Packs/day: 2.00    Years: 40.00    Pack years: 80.00    Types: Cigarettes    Last attempt to quit: 04/19/2015    Years since quitting: 2.4  . Smokeless tobacco: Never Used  Substance and Sexual Activity  . Alcohol use: Yes    Alcohol/week: 1.8 oz    Types: 3 Cans of beer per week    Comment: case/weeked,   . Drug use: No  . Sexual activity: None  Other Topics Concern  . None  Social History Narrative  . None   Allergies  Allergen Reactions  . Bupropion Hcl     REACTION: rash @ pressure areas (waist, groin , axillae)  . Penicillins     ? Rash , hives  . Amoxicillin Itching and Rash   Family History  Problem Relation Age of Onset  . Stroke Father   . Melanoma Father   . Stomach cancer Father   . Alcohol abuse Father   . Diabetes Mother   . Breast cancer Sister   . Diabetes Brother   . Colon cancer Neg Hx   . Esophageal cancer Neg Hx   . Rectal cancer Neg Hx      Past medical history, social, surgical and family history all reviewed in electronic medical record.  No pertanent information unless stated regarding to the chief complaint.   Review of Systems:Review of systems updated and as accurate as of 10/03/17  No headache, visual changes, nausea, vomiting, diarrhea, constipation, dizziness, abdominal pain, skin rash, fevers, chills, night sweats, weight loss, swollen lymph nodes, body aches, joint swelling,  chest pain, shortness of breath, mood changes.  Positive muscle aches  Objective  Blood pressure (!) 156/80, pulse 65, height 6' (1.829 m), weight 175 lb (79.4 kg), SpO2 96 %. Systems examined below as of 10/03/17   General: No apparent distress alert and oriented x3 mood and affect normal, dressed appropriately.  HEENT: Pupils equal, extraocular movements intact  Respiratory: Patient's speak in full sentences and does not appear short of breath  Cardiovascular: No lower extremity edema, non tender,  no erythema  Skin: Warm dry intact with no signs of infection or rash on extremities or on axial skeleton.  Abdomen: Soft nontender  Neuro: Cranial nerves II through XII are intact, neurovascularly intact in all extremities with 2+ DTRs and 2+ pulses.  Lymph: No lymphadenopathy of posterior or anterior cervical chain or axillae bilaterally.  Gait normal with good balance and coordination.  MSK:  Non tender with full range of motion and good stability and symmetric strength and tone of shoulders, elbows, hip, knee and ankles bilaterally.  Wrist: Left Inspection normal with no visible erythema or swelling. ROM smooth and normal with good flexion and extension and ulnar/radial deviation that is symmetrical with opposite wrist. Pain over the TFCC still noted with a trace amount of swelling. No snuffbox tenderness. No tenderness over Canal of Guyon. Strength 5/5 in all directions without pain. Negative Finkelstein, tinel's and phalens. Negative Watson's test. Contralateral wrist unremarkable     Impression and Recommendations:     This case required medical decision making of moderate complexity.      Note: This dictation was prepared with Dragon dictation along with smaller phrase technology. Any transcriptional errors that result from this process are unintentional.

## 2017-10-03 ENCOUNTER — Ambulatory Visit: Payer: 59 | Admitting: Family Medicine

## 2017-10-03 ENCOUNTER — Encounter: Payer: Self-pay | Admitting: Family Medicine

## 2017-10-03 DIAGNOSIS — S6982XA Other specified injuries of left wrist, hand and finger(s), initial encounter: Secondary | ICD-10-CM | POA: Diagnosis not present

## 2017-10-03 NOTE — Patient Instructions (Signed)
5 weeks and will repeat injection if needed

## 2017-10-03 NOTE — Assessment & Plan Note (Signed)
Little better after the injection.  Continue the icing regimen and home exercises.  Patient will come back and see me again 5-6 weeks

## 2017-10-19 ENCOUNTER — Telehealth: Payer: Self-pay | Admitting: Internal Medicine

## 2017-10-19 MED ORDER — TRAMADOL HCL 50 MG PO TABS: 50.0000 mg | ORAL_TABLET | Freq: Two times a day (BID) | ORAL | 0 refills | Status: DC | PRN

## 2017-10-19 NOTE — Telephone Encounter (Signed)
Pt called and would like a refill of his TRAMADOL, he states out of the 30 he was prescribed last time he has 2 left, he is still having back pain and he wants to do some yard work and knows his pain will get worse.  He would like this medication sent to the [facility] of file,  Please advise

## 2017-10-19 NOTE — Addendum Note (Signed)
Addended by: Binnie Rail on: 10/19/2017 08:20 PM   Modules accepted: Orders

## 2017-10-19 NOTE — Telephone Encounter (Signed)
Check [facility] registry last filled 07/08/2017.Marland KitchenJohny Chess

## 2017-10-20 ENCOUNTER — Other Ambulatory Visit: Payer: Self-pay | Admitting: Internal Medicine

## 2017-11-06 NOTE — Progress Notes (Signed)
[facility] [place], [place] [address] Phone: [phone] Subjective:     CC: wrist pain f/u   WLN:[id number]  Keith [name] is a 62 y.o. male coming in with complaint of left wrist pain.  Found to have an injury to the TFCC and responded well to an injection in January.  Patient states was doing significantly better for some time.  Now worsening again.  Not as worse as what it was previously.  Patient was to increase activity slowly over the course the next several days which had done initially.  Patient though still is finding it difficult to be motivated to do the exercises regularly.    Past Medical History:  Diagnosis Date  . Chronic kidney disease 04/23/15   ARF- "resolved"  . Colon polyp   . Dehydration 04/2015   Hospitalized for 4 days after abnormal labs found during preop appointment.  . Diabetes mellitus without complication ([place])    E0C 04/24/15  . Diverticulosis   . Hypertension   . Tobacco abuse    Past Surgical History:  Procedure Laterality Date  . colonoscopy with polypectomy     polyp X 1  . 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 MAXIMUM ACCESS (MAS)POSTERIOR LUMBAR INTERBODY FUSION (PLIF) 2 LEVEL N/A 05/14/2015   Procedure: Lumbar three-lumbar four,  umbar four-lumbar five Maximum access posterior lumbar interbody fusion;  Surgeon: Eustace Moore, MD;  Location: [facility];  Service: Neurosurgery;  Laterality: N/A;  L3-4 L4-5 Maximum access posterior lumbar interbody fusion  . WISDOM TOOTH EXTRACTION     Social History   Socioeconomic History  . Marital status: Married    Spouse name: Not on file  . Number of children: Not on file  . Years of education: Not on file  . Highest education level: Not on file  Occupational History  . Not on file  Social Needs  . Financial resource strain: Not on file  . Food insecurity:    Worry: Not on file    Inability: Not on file  . Transportation  needs:    Medical: Not on file    Non-medical: Not on file  Tobacco Use  . Smoking status: Current Every Day Smoker    Packs/day: 2.00    Years: 40.00    Pack years: 80.00    Types: Cigarettes    Last attempt to quit: 04/19/2015    Years since quitting: 2.5  . Smokeless tobacco: Never Used  Substance and Sexual Activity  . Alcohol use: Yes    Alcohol/week: 1.8 oz    Types: 3 Cans of beer per week    Comment: case/weeked,   . Drug use: No  . Sexual activity: Not on file  Lifestyle  . Physical activity:    Days per week: Not on file    Minutes per session: Not on file  . Stress: Not on file  Relationships  . Social connections:    Talks on phone: Not on file    Gets together: Not on file    Attends religious service: Not on file    Active member of club or organization: Not on file    Attends meetings of clubs or organizations: Not on file    Relationship status: Not on file  Other Topics Concern  . Not on file  Social History Narrative  . Not on file   Allergies  Allergen Reactions  .  Bupropion Hcl     REACTION: rash @ pressure areas (waist, groin , axillae)  . Penicillins     ? Rash , hives  . Amoxicillin Itching and Rash   Family History  Problem Relation Age of Onset  . Stroke Father   . Melanoma Father   . Stomach cancer Father   . Alcohol abuse Father   . Diabetes Mother   . Breast cancer Sister   . Diabetes Brother   . Colon cancer Neg Hx   . Esophageal cancer Neg Hx   . Rectal cancer Neg Hx      Past medical history, social, surgical and family history all reviewed in electronic medical record.  No pertanent information unless stated regarding to the chief complaint.   Review of Systems:Review of systems updated and as accurate as of 11/07/17  No headache, visual changes, nausea, vomiting, diarrhea, constipation, dizziness, abdominal pain, skin rash, fevers, chills, night sweats, weight loss, swollen lymph nodes, body aches, joint swelling,  chest  pain, shortness of breath, mood changes.  Positive muscle aches and fatigue  Objective  Blood pressure (!) 164/92, pulse 77, height 6' (1.829 m), weight 178 lb (80.7 kg), SpO2 95 %. Systems examined below as of 11/07/17   General: No apparent distress alert and oriented x3 mood and affect normal, dressed appropriately.  HEENT: Pupils equal, extraocular movements intact  Respiratory: Patient's speak in full sentences and does not appear short of breath  Cardiovascular: No lower extremity edema, non tender, no erythema  Skin: Warm dry intact with no signs of infection or rash on extremities or on axial skeleton.  Abdomen: Soft nontender  Neuro: Cranial nerves II through XII are intact, neurovascularly intact in all extremities with 2+ DTRs and 2+ pulses.  Lymph: No lymphadenopathy of posterior or anterior cervical chain or axillae bilaterally.  Gait normal with good balance and coordination.  MSK:  Non tender with full range of motion and good stability and symmetric strength and tone of shoulders, elbows,  hip, knee and ankles bilaterally.  Mild arthritic changes of multiple joints Wrist: Left Inspection normal with no visible erythema or swelling. ROM smooth and normal with good flexion and extension and ulnar/radial deviation that is symmetrical with opposite wrist. Palpation shows tenderness over the TFCC. No snuffbox tenderness. No tenderness over Canal of Guyon. Strength 5/5 in all directions without pain. Negative Finkelstein, tinel's and phalens. Positive Watson's test.  Procedure: Real-time Ultrasound Guided Injection of left TFCC injection Device: GE Logiq Q7 Ultrasound guided injection is preferred based studies that show increased duration, increased effect, greater accuracy, decreased procedural pain, increased response rate, and decreased cost with ultrasound guided versus blind injection.  Verbal informed consent obtained.  Time-out conducted.  Noted no overlying erythema,  induration, or other signs of local infection.  Skin prepped in a sterile fashion.  Local anesthesia: Topical Ethyl chloride.  With sterile technique and under real time ultrasound guidance: With a 25-gauge half inch needle patient was injected with 0.5 cc of 0.5% Marcaine and 0.5 cc of Kenalog 40 mg/mL. Completed without difficulty  Pain immediately resolved suggesting accurate placement of the medication.  Advised to call if fevers/chills, erythema, induration, drainage, or persistent bleeding.  Images permanently stored and available for review in the ultrasound unit.  Impression: Technically successful ultrasound guided injection.   Impression and Recommendations:     This case required medical decision making of moderate complexity.      Note: This dictation was prepared with Dragon dictation  along with smaller phrase technology. Any transcriptional errors that result from this process are unintentional.

## 2017-11-07 ENCOUNTER — Ambulatory Visit: Payer: Self-pay

## 2017-11-07 ENCOUNTER — Ambulatory Visit: Payer: 59 | Admitting: Family Medicine

## 2017-11-07 ENCOUNTER — Encounter: Payer: Self-pay | Admitting: Family Medicine

## 2017-11-07 ENCOUNTER — Encounter: Payer: Self-pay | Admitting: Gastroenterology

## 2017-11-07 VITALS — BP 164/92 | HR 77 | Ht 72.0 in | Wt 178.0 lb

## 2017-11-07 DIAGNOSIS — M25532 Pain in left wrist: Secondary | ICD-10-CM | POA: Diagnosis not present

## 2017-11-07 DIAGNOSIS — S6982XA Other specified injuries of left wrist, hand and finger(s), initial encounter: Secondary | ICD-10-CM | POA: Diagnosis not present

## 2017-11-07 NOTE — Assessment & Plan Note (Signed)
Repeat injection given today.  Discussed icing regimen and home exercises.  Discussed bracing and topical anti-inflammatories.  Patient will follow up with me again in 8 weeks

## 2017-11-07 NOTE — Patient Instructions (Signed)
Good to see you  Injected it again and we will see  Vitamin D 4000 IU daily for 2 weeks then 2000 IU daily thereafter Iron 65mg  with 500mg  of vitamin C at least 3 times a week DHEA 50 mg daily for 3 weeks then 1 week off and can repeat as needed

## 2017-11-08 ENCOUNTER — Other Ambulatory Visit: Payer: Self-pay | Admitting: Emergency Medicine

## 2017-11-08 MED ORDER — HYDROCHLOROTHIAZIDE 25 MG PO TABS: 25.0000 mg | ORAL_TABLET | Freq: Every day | ORAL | 0 refills | Status: DC

## 2017-11-08 MED ORDER — LOSARTAN POTASSIUM 100 MG PO TABS: 100.0000 mg | ORAL_TABLET | Freq: Every day | ORAL | 0 refills | Status: DC

## 2017-12-16 ENCOUNTER — Other Ambulatory Visit: Payer: Self-pay

## 2017-12-16 ENCOUNTER — Ambulatory Visit (AMBULATORY_SURGERY_CENTER): Payer: Self-pay | Admitting: *Deleted

## 2017-12-16 VITALS — Ht 72.0 in | Wt 174.0 lb

## 2017-12-16 DIAGNOSIS — Z86010 Personal history of colonic polyps: Secondary | ICD-10-CM

## 2017-12-16 MED ORDER — NA SULFATE-K SULFATE-MG SULF 17.5-3.13-1.6 GM/177ML PO SOLN: 1.0000 | Freq: Once | ORAL | 0 refills | Status: AC

## 2017-12-16 NOTE — Progress Notes (Signed)
No egg or soy allergy known to patient  No issues with past sedation with any surgeries  or procedures, no intubation problems  No diet pills per patient No home 02 use per patient  No blood thinners per patient  Pt denies issues with constipation  No A fib or A flutter  EMMI video sent to pt's e mail - pt declined   $15 coupon for suprep to pt in PV today

## 2017-12-19 ENCOUNTER — Encounter: Payer: Self-pay | Admitting: Gastroenterology

## 2017-12-30 ENCOUNTER — Ambulatory Visit (AMBULATORY_SURGERY_CENTER): Payer: 59 | Admitting: Gastroenterology

## 2017-12-30 ENCOUNTER — Encounter: Payer: Self-pay | Admitting: Gastroenterology

## 2017-12-30 ENCOUNTER — Other Ambulatory Visit: Payer: Self-pay

## 2017-12-30 VITALS — BP 130/75 | HR 52 | Temp 98.4°F | Resp 21 | Ht 72.0 in | Wt 174.0 lb

## 2017-12-30 DIAGNOSIS — Z86010 Personal history of colonic polyps: Secondary | ICD-10-CM | POA: Diagnosis present

## 2017-12-30 MED ORDER — SODIUM CHLORIDE 0.9 % IV SOLN: 500.0000 mL | Freq: Once | INTRAVENOUS | Status: DC

## 2017-12-30 NOTE — Patient Instructions (Signed)
YOU HAD AN ENDOSCOPIC PROCEDURE TODAY AT THE [facility]:   Refer to the procedure report that was given to you for any specific questions about what was found during the examination.  If the procedure report does not answer your questions, please call your gastroenterologist to clarify.  If you requested that your care partner not be given the details of your procedure findings, then the procedure report has been included in a sealed envelope for you to review at your convenience later.  YOU SHOULD EXPECT: Some feelings of bloating in the abdomen. Passage of more gas than usual.  Walking can help get rid of the air that was put into your GI tract during the procedure and reduce the bloating. If you had a lower endoscopy (such as a colonoscopy or flexible sigmoidoscopy) you may notice spotting of blood in your stool or on the toilet paper. If you underwent a bowel prep for your procedure, you may not have a normal bowel movement for a few days.  Please Note:  You might notice some irritation and congestion in your nose or some drainage.  This is from the oxygen used during your procedure.  There is no need for concern and it should clear up in a day or so.  SYMPTOMS TO REPORT IMMEDIATELY:   Following lower endoscopy (colonoscopy or flexible sigmoidoscopy):  Excessive amounts of blood in the stool  Significant tenderness or worsening of abdominal pains  Swelling of the abdomen that is new, acute  Fever of 100F or higher   For urgent or emergent issues, a gastroenterologist can be reached at any hour by calling [phone].   DIET:  We do recommend a small meal at first, but then you may proceed to your regular diet.  Drink plenty of fluids but you should avoid alcoholic beverages for 24 hours.  ACTIVITY:  You should plan to take it easy for the rest of today and you should NOT DRIVE or use heavy machinery until tomorrow (because of the sedation medicines used during the test).     FOLLOW UP: Our staff will call the number listed on your records the next business day following your procedure to check on you and address any questions or concerns that you may have regarding the information given to you following your procedure. If we do not reach you, we will leave a message.  However, if you are feeling well and you are not experiencing any problems, there is no need to return our call.  We will assume that you have returned to your regular daily activities without incident.  If any biopsies were taken you will be contacted by phone or by letter within the next 1-3 weeks.  Please call us at [phone] if you have not heard about the biopsies in 3 weeks.    SIGNATURES/CONFIDENTIALITY: You and/or your care partner have signed paperwork which will be entered into your electronic medical record.  These signatures attest to the fact that that the information above on your After Visit Summary has been reviewed and is understood.  Full responsibility of the confidentiality of this discharge information lies with you and/or your care-partner.  Thank you for letting us take care of your healthcare needs today. 

## 2017-12-30 NOTE — Progress Notes (Signed)
To recovery, report to RN, VSS. 

## 2017-12-30 NOTE — Progress Notes (Signed)
Pt's states no medical or surgical changes since previsit or office visit. 

## 2017-12-30 NOTE — Op Note (Signed)
[facility] Patient Name: Keith [name] Procedure Date: 12/30/2017 9:33 AM MRN: [id number] Endoscopist: Ladene Artist , MD Age: 62 Referring MD:  Date of Birth: 1956-02-04 Gender: Male Account #: [id number] Procedure:                Colonoscopy Indications:              Surveillance: Personal history of adenomatous                            polyps on last colonoscopy 5 years ago Medicines:                Monitored Anesthesia Care Procedure:                Pre-Anesthesia Assessment:                           - Prior to the procedure, a History and Physical                            was performed, and patient medications and                            allergies were reviewed. The patient's tolerance of                            previous anesthesia was also reviewed. The risks                            and benefits of the procedure and the sedation                            options and risks were discussed with the patient.                            All questions were answered, and informed consent                            was obtained. Prior Anticoagulants: The patient has                            taken no previous anticoagulant or antiplatelet                            agents. ASA Grade Assessment: II - A patient with                            mild systemic disease. After reviewing the risks                            and benefits, the patient was deemed in                            satisfactory condition to undergo the procedure.  After obtaining informed consent, the colonoscope                            was passed under direct vision. Throughout the                            procedure, the patient's blood pressure, pulse, and                            oxygen saturations were monitored continuously. The                            Model PCF-[id number]) scope was introduced                            through the anus and  advanced to the the cecum,                            identified by appendiceal orifice and ileocecal                            valve. The ileocecal valve, appendiceal orifice,                            and rectum were photographed. The quality of the                            bowel preparation was adequate. The colonoscopy was                            performed without difficulty. The patient tolerated                            the procedure well. Scope In: 9:43:15 AM Scope Out: 9:58:29 AM Scope Withdrawal Time: 0 hours 9 minutes 58 seconds  Total Procedure Duration: 0 hours 15 minutes 14 seconds  Findings:                 The perianal and digital rectal examinations were                            normal.                           Multiple medium-mouthed diverticula were found in                            the left colon. There was narrowing of the colon in                            association with the diverticular opening. There                            was evidence of diverticular spasm. There was  evidence of an impacted diverticulum. There was no                            evidence of diverticular bleeding.                           Internal hemorrhoids were found during                            retroflexion. The hemorrhoids were small and Grade                            I (internal hemorrhoids that do not prolapse).                           The exam was otherwise without abnormality on                            direct and retroflexion views. Complications:            No immediate complications. Estimated blood loss:                            None. Estimated Blood Loss:     Estimated blood loss: none. Impression:               - Moderate diverticulosis in the left colon.                           - Internal hemorrhoids.                           - The examination was otherwise normal on direct                            and retroflexion  views.                           - No specimens collected. Recommendation:           - Repeat colonoscopy in 5 years for surveillance.                           - Patient has a contact number available for                            emergencies. The signs and symptoms of potential                            delayed complications were discussed with the                            patient. Return to normal activities tomorrow.                            Written discharge instructions were provided to the  patient.                           - High fiber diet long term.                           - Continue present medications. Ladene Artist, MD 12/30/2017 10:01:26 AM This report has been signed electronically.

## 2018-01-03 ENCOUNTER — Telehealth: Payer: Self-pay | Admitting: *Deleted

## 2018-01-03 NOTE — Telephone Encounter (Signed)
  Follow up Call-  Call back number 12/30/2017  Post procedure Call Back phone  # [phone]  Permission to leave phone message Yes  Some recent data might be hidden     Patient questions:  Do you have a fever, pain , or abdominal swelling? No. Pain Score  0 *  Have you tolerated food without any problems? Yes.    Have you been able to return to your normal activities? Yes.    Do you have any questions about your discharge instructions: Diet   No. Medications  No. Follow up visit  No.  Do you have questions or concerns about your Care? No.  Actions: * If pain score is 4 or above: No action needed, pain <4.

## 2018-01-05 NOTE — Patient Instructions (Addendum)
Test(s) ordered today. Your results will be released to MyChart (or called to you) after review, usually within 72hours after test completion. If any changes need to be made, you will be notified at that same time.  All other Health Maintenance issues reviewed.   All recommended immunizations and age-appropriate screenings are up-to-date or discussed.  No immunizations administered today.   Medications reviewed and updated.  No changes recommended at this time.  Your prescription(s) have been submitted to your pharmacy. Please take as directed and contact our office if you believe you are having problem(s) with the medication(s).   Please followup in 6 months    Health Maintenance, Male A healthy lifestyle and preventive care is important for your health and wellness. Ask your health care provider about what schedule of regular examinations is right for you. What should I know about weight and diet? Eat a Healthy Diet  Eat plenty of vegetables, fruits, whole grains, low-fat dairy products, and lean protein.  Do not eat a lot of foods high in solid fats, added sugars, or salt.  Maintain a Healthy Weight Regular exercise can help you achieve or maintain a healthy weight. You should:  Do at least 150 minutes of exercise each week. The exercise should increase your heart rate and make you sweat (moderate-intensity exercise).  Do strength-training exercises at least twice a week.  Watch Your Levels of Cholesterol and Blood Lipids  Have your blood tested for lipids and cholesterol every 5 years starting at 64 years of age. If you are at high risk for heart disease, you should start having your blood tested when you are 64 years old. You may need to have your cholesterol levels checked more often if: ? Your lipid or cholesterol levels are high. ? You are older than 64 years of age. ? You are at high risk for heart disease.  What should I know about cancer screening? Many types of  cancers can be detected early and may often be prevented. Lung Cancer  You should be screened every year for lung cancer if: ? You are a current smoker who has smoked for at least 30 years. ? You are a former smoker who has quit within the past 15 years.  Talk to your health care provider about your screening options, when you should start screening, and how often you should be screened.  Colorectal Cancer  Routine colorectal cancer screening usually begins at 64 years of age and should be repeated every 5-10 years until you are 64 years old. You may need to be screened more often if early forms of precancerous polyps or small growths are found. Your health care provider may recommend screening at an earlier age if you have risk factors for colon cancer.  Your health care provider may recommend using home test kits to check for hidden blood in the stool.  A small camera at the end of a tube can be used to examine your colon (sigmoidoscopy or colonoscopy). This checks for the earliest forms of colorectal cancer.  Prostate and Testicular Cancer  Depending on your age and overall health, your health care provider may do certain tests to screen for prostate and testicular cancer.  Talk to your health care provider about any symptoms or concerns you have about testicular or prostate cancer.  Skin Cancer  Check your skin from head to toe regularly.  Tell your health care provider about any new moles or changes in moles, especially if: ? There is a   change in a mole's size, shape, or color. ? You have a mole that is larger than a pencil eraser.  Always use sunscreen. Apply sunscreen liberally and repeat throughout the day.  Protect yourself by wearing long sleeves, pants, a wide-brimmed hat, and sunglasses when outside.  What should I know about heart disease, diabetes, and high blood pressure?  If you are 18-39 years of age, have your blood pressure checked every 3-5 years. If you are  40 years of age or older, have your blood pressure checked every year. You should have your blood pressure measured twice-once when you are at a hospital or clinic, and once when you are not at a hospital or clinic. Record the average of the two measurements. To check your blood pressure when you are not at a hospital or clinic, you can use: ? An automated blood pressure machine at a pharmacy. ? A home blood pressure monitor.  Talk to your health care provider about your target blood pressure.  If you are between 45-79 years old, ask your health care provider if you should take aspirin to prevent heart disease.  Have regular diabetes screenings by checking your fasting blood sugar level. ? If you are at a normal weight and have a low risk for diabetes, have this test once every three years after the age of 45. ? If you are overweight and have a high risk for diabetes, consider being tested at a younger age or more often.  A one-time screening for abdominal aortic aneurysm (AAA) by ultrasound is recommended for men aged 65-75 years who are current or former smokers. What should I know about preventing infection? Hepatitis B If you have a higher risk for hepatitis B, you should be screened for this virus. Talk with your health care provider to find out if you are at risk for hepatitis B infection. Hepatitis C Blood testing is recommended for:  Everyone born from 1945 through 1965.  Anyone with known risk factors for hepatitis C.  Sexually Transmitted Diseases (STDs)  You should be screened each year for STDs including gonorrhea and chlamydia if: ? You are sexually active and are younger than 64 years of age. ? You are older than 64 years of age and your health care provider tells you that you are at risk for this type of infection. ? Your sexual activity has changed since you were last screened and you are at an increased risk for chlamydia or gonorrhea. Ask your health care provider if you  are at risk.  Talk with your health care provider about whether you are at high risk of being infected with HIV. Your health care provider may recommend a prescription medicine to help prevent HIV infection.  What else can I do?  Schedule regular health, dental, and eye exams.  Stay current with your vaccines (immunizations).  Do not use any tobacco products, such as cigarettes, chewing tobacco, and e-cigarettes. If you need help quitting, ask your health care provider.  Limit alcohol intake to no more than 2 drinks per day. One drink equals 12 ounces of beer, 5 ounces of wine, or 1 ounces of hard liquor.  Do not use street drugs.  Do not share needles.  Ask your health care provider for help if you need support or information about quitting drugs.  Tell your health care provider if you often feel depressed.  Tell your health care provider if you have ever been abused or do not feel safe at home.   This information is not intended to replace advice given to you by your health care provider. Make sure you discuss any questions you have with your health care provider. Document Released: 01/22/2008 Document Revised: 03/24/2016 Document Reviewed: 04/29/2015 Elsevier Interactive Patient Education  2018 Elsevier Inc.  

## 2018-01-05 NOTE — Progress Notes (Signed)
Subjective:    Patient ID: Keith [name], male    DOB: [birth year], 62 y.o.   MRN: [id number]  HPI He is here for a physical exam.   He denies any changes in his health since he was here last.  He has no concerns or questions.  Things are the same at home.  His wife is constantly on him.  They are no longer in therapy and he is no longer seeing a therapist.  He thinks he is depressed, but is not wants to do anything about at this time.  Medications and allergies reviewed with patient and updated if appropriate.  Patient Active Problem List   Diagnosis Date Noted  . TFCC (triangular fibrocartilage complex) injury, left, initial encounter 09/05/2017  . Polyarthropathy 09/05/2017  . Arthritis of left wrist 08/05/2017  . Left wrist pain 07/08/2017  . Anxiety and depression 11/09/2015  . S/P lumbar spinal fusion 05/14/2015  . Alcohol abuse 04/23/2015  . DM (diabetes mellitus) type II controlled with renal manifestation ([place]) 04/23/2015  . Lumbar radiculopathy 01/23/2015  . CARPAL TUNNEL SYNDROME 12/22/2009  . HYPERPLASIA PROSTATE UNS W/O UR OBST & OTH LUTS 12/22/2009  . DIVERTICULOSIS, COLON 10/03/2008  . COLONIC POLYPS, HX OF 10/03/2008  . Hyperlipidemia 08/02/2007  . SMOKER 02/06/2007  . Essential hypertension 02/06/2007    Current Outpatient Medications on File Prior to Visit  Medication Sig Dispense Refill  . atorvastatin (LIPITOR) 20 MG tablet Take 1 tablet (20 mg total) by mouth daily. --- Must come to appt for further refill 90 tablet 1  . cloNIDine (CATAPRES) 0.2 MG tablet Take 1 tablet (0.2 mg total) by mouth 2 (two) times daily. 180 tablet 1  . hydrALAZINE (APRESOLINE) 50 MG tablet Take 2 tablets (100 mg total) by mouth 2 (two) times daily. 120 tablet 5  . losartan (COZAAR) 100 MG tablet Take 1 tablet (100 mg total) by mouth daily. 90 tablet 0  . losartan-hydrochlorothiazide (HYZAAR) 100-25 MG tablet Take 1 tablet by mouth daily. 90 tablet 1  . metoprolol tartrate  (LOPRESSOR) 25 MG tablet Take 1 tablet (25 mg total) by mouth 2 (two) times daily. 180 tablet 1  . traMADol (ULTRAM) 50 MG tablet Take 1 tablet (50 mg total) by mouth every 12 (twelve) hours as needed. 30 tablet 0   No current facility-administered medications on file prior to visit.     Past Medical History:  Diagnosis Date  . Anxiety   . Cataract    beginnings   . Chronic kidney disease 04/23/15   ARF- "resolved"  . Colon polyp   . COPD (chronic obstructive pulmonary disease) (H[facility])    per pt from smoking   . Dehydration 04/2015   Hospitalized for 4 days after abnormal labs found during preop appointment.  . Depression   . Diabetes mellitus without complication ([place])    O3J 04/24/15- diet controlled   . Diverticulosis   . Hyperlipidemia   . Hypertension   . Tobacco abuse     Past Surgical History:  Procedure Laterality Date  . COLONOSCOPY    . colonoscopy with polypectomy     polyp X 1  . 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 MAXIMUM ACCESS (MAS)POSTERIOR LUMBAR INTERBODY FUSION (PLIF) 2 LEVEL N/A 05/14/2015   Procedure: Lumbar three-lumbar four,  umbar four-lumbar five Maximum access posterior lumbar interbody fusion;  Surgeon: Eustace Moore, MD;  Location: [facility];  Service: Neurosurgery;  Laterality: N/A;  L3-4 L4-5 Maximum access posterior lumbar interbody fusion  . POLYPECTOMY    . WISDOM TOOTH EXTRACTION      Social History   Socioeconomic History  . Marital status: Married    Spouse name: Not on file  . Number of children: Not on file  . Years of education: Not on file  . Highest education level: Not on file  Occupational History  . Not on file  Social Needs  . Financial resource strain: Not on file  . Food insecurity:    Worry: Not on file    Inability: Not on file  . Transportation needs:    Medical: Not on file    Non-medical: Not on file  Tobacco Use  . Smoking status: Current Every Day Smoker    Packs/day: 1.50     Years: 40.00    Pack years: 60.00    Types: Cigarettes    Last attempt to quit: 04/19/2015    Years since quitting: 2.7  . Smokeless tobacco: Never Used  Substance and Sexual Activity  . Alcohol use: Yes    Alcohol/week: 1.8 oz    Types: 3 Cans of beer per week    Comment: daily drinks- 12 pack on the weekends case/weekend,   . Drug use: No  . Sexual activity: Not on file  Lifestyle  . Physical activity:    Days per week: Not on file    Minutes per session: Not on file  . Stress: Not on file  Relationships  . Social connections:    Talks on phone: Not on file    Gets together: Not on file    Attends religious service: Not on file    Active member of club or organization: Not on file    Attends meetings of clubs or organizations: Not on file    Relationship status: Not on file  Other Topics Concern  . Not on file  Social History Narrative  . Not on file    Family History  Problem Relation Age of Onset  . Stroke Father   . Melanoma Father   . Stomach cancer Father   . Alcohol abuse Father   . Diabetes Mother   . Diabetes Brother   . Breast cancer Sister   . Colon cancer Neg Hx   . Esophageal cancer Neg Hx   . Rectal cancer Neg Hx   . Colon polyps Neg Hx     Review of Systems  Constitutional: Negative for chills and fever.  Eyes: Negative for visual disturbance.  Respiratory: Positive for cough (smoking related) and wheezing (smoking related). Negative for shortness of breath.   Cardiovascular: Negative for chest pain, palpitations and leg swelling.  Gastrointestinal: Negative for abdominal pain, blood in stool, constipation, diarrhea and nausea.  Genitourinary: Negative for dysuria and hematuria.  Musculoskeletal: Positive for back pain.  Skin: Negative for color change and rash.  Neurological: Negative for light-headedness and headaches.  Psychiatric/Behavioral: Positive for dysphoric mood. The patient is not nervous/anxious.        Objective:   Vitals:    01/06/18 1053  BP: 120/62  Pulse: 77  Resp: 16  Temp: 98.5 F (36.9 C)  SpO2: 95%   Filed Weights   01/06/18 1053  Weight: 177 lb (80.3 kg)   Body mass index is 24.01 kg/m.  Wt Readings from Last 3 Encounters:  01/06/18 177 lb (80.3 kg)  12/30/17 174 lb (78.9 kg)  12/16/17 174 lb (78.9 kg)  Physical Exam Constitutional: He appears well-developed and well-nourished. No distress.  HENT:  Head: Normocephalic and atraumatic.  Right Ear: External ear normal.  Left Ear: External ear normal.  Mouth/Throat: Oropharynx is clear and moist.  Normal ear canals and TM b/l  Eyes: Conjunctivae and EOM are normal.  Neck: Neck supple. No tracheal deviation present. No thyromegaly present.  No carotid bruit  Cardiovascular: Normal rate, regular rhythm, normal heart sounds and intact distal pulses.   No murmur heard. Pulmonary/Chest: Effort normal and breath sounds normal. No respiratory distress. He has mild inspiratory wheezes. He has no rales.  Abdominal: Soft. He exhibits no distension. There is no tenderness.  Genitourinary: deferred  Musculoskeletal: He exhibits no edema.  Lymphadenopathy:   He has no cervical adenopathy.  Skin: Skin is warm and dry. He is not diaphoretic.  Psychiatric: He has a normal mood and affect. His behavior is normal.         Assessment & Plan:   Physical exam: Screening blood work ordered Immunizations  Discussed shingrix, others up to date Colonoscopy   Up to date  Eye exams  Up to date  - does not know optometrists name EKG   Done   05/2015 Exercise        none Weight  Normal BMI Skin no concerns Substance abuse   Smoking, binge drinks on weekends  See Problem List for Assessment and Plan of chronic medical problems.   Follow-up in 6 months

## 2018-01-06 ENCOUNTER — Other Ambulatory Visit (INDEPENDENT_AMBULATORY_CARE_PROVIDER_SITE_OTHER): Payer: 59

## 2018-01-06 ENCOUNTER — Ambulatory Visit: Payer: 59 | Admitting: Internal Medicine

## 2018-01-06 ENCOUNTER — Encounter: Payer: Self-pay | Admitting: Internal Medicine

## 2018-01-06 VITALS — BP 120/62 | HR 77 | Temp 98.5°F | Resp 16 | Wt 177.0 lb

## 2018-01-06 DIAGNOSIS — I10 Essential (primary) hypertension: Secondary | ICD-10-CM

## 2018-01-06 DIAGNOSIS — E7849 Other hyperlipidemia: Secondary | ICD-10-CM | POA: Diagnosis not present

## 2018-01-06 DIAGNOSIS — F419 Anxiety disorder, unspecified: Secondary | ICD-10-CM

## 2018-01-06 DIAGNOSIS — Z0000 Encounter for general adult medical examination without abnormal findings: Secondary | ICD-10-CM | POA: Diagnosis not present

## 2018-01-06 DIAGNOSIS — M5416 Radiculopathy, lumbar region: Secondary | ICD-10-CM

## 2018-01-06 DIAGNOSIS — E1122 Type 2 diabetes mellitus with diabetic chronic kidney disease: Secondary | ICD-10-CM | POA: Diagnosis not present

## 2018-01-06 DIAGNOSIS — Z125 Encounter for screening for malignant neoplasm of prostate: Secondary | ICD-10-CM

## 2018-01-06 DIAGNOSIS — F17200 Nicotine dependence, unspecified, uncomplicated: Secondary | ICD-10-CM

## 2018-01-06 DIAGNOSIS — F329 Major depressive disorder, single episode, unspecified: Secondary | ICD-10-CM | POA: Diagnosis not present

## 2018-01-06 DIAGNOSIS — F1010 Alcohol abuse, uncomplicated: Secondary | ICD-10-CM

## 2018-01-06 DIAGNOSIS — F32A Depression, unspecified: Secondary | ICD-10-CM

## 2018-01-06 LAB — LIPID PANEL
CHOLESTEROL: 136 mg/dL (ref 0–200)
HDL: 33.5 mg/dL — ABNORMAL LOW (ref >39.00)
LDL CALC: 77 mg/dL (ref 0–99)
NonHDL: 102.86
Total CHOL/HDL Ratio: 4
Triglycerides: 129 mg/dL (ref 0.0–149.0)
VLDL: 25.8 mg/dL (ref 0.0–40.0)

## 2018-01-06 LAB — COMPREHENSIVE METABOLIC PANEL
ALBUMIN: 4.1 g/dL (ref 3.5–5.2)
ALT: 12 U/L (ref 0–53)
AST: 13 U/L (ref 0–37)
Alkaline Phosphatase: 58 U/L (ref 39–117)
BUN: 16 mg/dL (ref 6–23)
CHLORIDE: 103 meq/L (ref 96–112)
CO2: 28 mEq/L (ref 19–32)
Calcium: 9.3 mg/dL (ref 8.4–10.5)
Creatinine, Ser: 1.14 mg/dL (ref 0.40–1.50)
GFR: 68.73 mL/min (ref >60.00)
Glucose, Bld: 133 mg/dL — ABNORMAL HIGH (ref 70–99)
POTASSIUM: 4.6 meq/L (ref 3.5–5.1)
SODIUM: 138 meq/L (ref 135–145)
Total Bilirubin: 0.3 mg/dL (ref 0.2–1.2)
Total Protein: 7 g/dL (ref 6.0–8.3)

## 2018-01-06 LAB — CBC WITH DIFFERENTIAL/PLATELET
BASOS PCT: 1.1 % (ref 0.0–3.0)
Basophils Absolute: 0.1 10*3/uL (ref 0.0–0.1)
EOS PCT: 3 % (ref 0.0–5.0)
Eosinophils Absolute: 0.2 10*3/uL (ref 0.0–0.7)
HEMATOCRIT: 45.2 % (ref 39.0–52.0)
HEMOGLOBIN: 15.5 g/dL (ref 13.0–17.0)
Lymphocytes Relative: 22.1 % (ref 12.0–46.0)
Lymphs Abs: 1.8 10*3/uL (ref 0.7–4.0)
MCHC: 34.2 g/dL (ref 30.0–36.0)
MCV: 97.3 fl (ref 78.0–100.0)
MONO ABS: 0.8 10*3/uL (ref 0.1–1.0)
Monocytes Relative: 10 % (ref 3.0–12.0)
Neutro Abs: 5.2 10*3/uL (ref 1.4–7.7)
Neutrophils Relative %: 63.8 % (ref 43.0–77.0)
Platelets: 204 10*3/uL (ref 150.0–400.0)
RBC: 4.65 Mil/uL (ref 4.22–5.81)
RDW: 14.3 % (ref 11.5–15.5)
WBC: 8.1 10*3/uL (ref 4.0–10.5)

## 2018-01-06 LAB — HEMOGLOBIN A1C: Hgb A1c MFr Bld: 6.1 % (ref 4.6–6.5)

## 2018-01-06 LAB — TSH: TSH: 0.83 u[IU]/mL (ref 0.35–4.50)

## 2018-01-06 MED ORDER — HYDRALAZINE HCL 50 MG PO TABS: 100.0000 mg | ORAL_TABLET | Freq: Two times a day (BID) | ORAL | 5 refills | Status: DC

## 2018-01-06 MED ORDER — ATORVASTATIN CALCIUM 20 MG PO TABS: 20.0000 mg | ORAL_TABLET | Freq: Every day | ORAL | 1 refills | Status: DC

## 2018-01-06 MED ORDER — METOPROLOL TARTRATE 25 MG PO TABS: 25.0000 mg | ORAL_TABLET | Freq: Two times a day (BID) | ORAL | 1 refills | Status: DC

## 2018-01-06 MED ORDER — CLONIDINE HCL 0.2 MG PO TABS: 0.2000 mg | ORAL_TABLET | Freq: Two times a day (BID) | ORAL | 1 refills | Status: DC

## 2018-01-06 MED ORDER — TRAMADOL HCL 50 MG PO TABS: 50.0000 mg | ORAL_TABLET | Freq: Two times a day (BID) | ORAL | 0 refills | Status: DC | PRN

## 2018-01-06 MED ORDER — LOSARTAN POTASSIUM-HCTZ 100-25 MG PO TABS: 1.0000 | ORAL_TABLET | Freq: Every day | ORAL | 1 refills | Status: DC

## 2018-01-06 NOTE — Assessment & Plan Note (Signed)
BP well controlled Current regimen effective and well tolerated Continue current medications at current doses He sometimes forgets his evening doses when he is at home and BP will be elevated - stressed better compliance cmp

## 2018-01-06 NOTE — Assessment & Plan Note (Addendum)
Having daily cough, wheezing, but no shortness of breath Wants to quit, and would like to try Chantix again-he will check with his insurance to see if this will be covered and let me know

## 2018-01-06 NOTE — Assessment & Plan Note (Signed)
Continue statin Check lipid panel, CMP, TSH

## 2018-01-06 NOTE — Assessment & Plan Note (Signed)
Diet controlled Check A1c Eye exam up-to-date-he will let us know the name of the optometrist Not exercising

## 2018-01-06 NOTE — Assessment & Plan Note (Addendum)
Intermittent back pain - takes tramadol only for severe back pain after certain activities Continue tramadol as needed-refilled today

## 2018-01-06 NOTE — Assessment & Plan Note (Signed)
He agrees he is depressed Does not want to start any medication at this time Not currently seeing a therapist, but he has done well in past Thinks he may start going back to church

## 2018-01-06 NOTE — Assessment & Plan Note (Signed)
Continues to binge drink on the weekends Discussed that he needs to control this-he understands that he is self-medicating to some degree

## 2018-01-09 ENCOUNTER — Ambulatory Visit: Payer: Self-pay | Admitting: Family Medicine

## 2018-01-09 LAB — PSA, TOTAL AND FREE
PSA, % FREE: 27 % (ref >25)
PSA, FREE: 0.3 ng/mL
PSA, Total: 1.1 ng/mL (ref <=4.0)

## 2018-01-21 DIAGNOSIS — J209 Acute bronchitis, unspecified: Secondary | ICD-10-CM | POA: Diagnosis not present

## 2018-01-21 DIAGNOSIS — R062 Wheezing: Secondary | ICD-10-CM | POA: Diagnosis not present

## 2018-01-21 DIAGNOSIS — J22 Unspecified acute lower respiratory infection: Secondary | ICD-10-CM | POA: Diagnosis not present

## 2018-01-23 ENCOUNTER — Telehealth: Payer: Self-pay | Admitting: Internal Medicine

## 2018-01-23 NOTE — Telephone Encounter (Signed)
Copied from [place] [id number]. Topic: Quick Communication - Rx Refill/Question >> Jan 23, 2018  3:50 PM Scherrie Gerlach wrote: Medication:  chantix Pt states dr burns told him to call when he is ready for this Rx, and he is.  [name] ([address]),  - [place] [phone] (Phone) [phone] (Fax)

## 2018-01-24 MED ORDER — VARENICLINE TARTRATE 0.5 MG X 11 & 1 MG X 42 PO MISC: ORAL | 0 refills | Status: DC

## 2018-01-24 NOTE — Telephone Encounter (Signed)
Sent to pharmacy 

## 2018-01-26 ENCOUNTER — Telehealth: Payer: Self-pay | Admitting: Emergency Medicine

## 2018-01-26 NOTE — Telephone Encounter (Signed)
PA completed for Chantix. [facility]

## 2018-01-27 NOTE — Telephone Encounter (Signed)
PA approved through 01/27/19. Spoke with [facility] to inform.

## 2018-02-04 DIAGNOSIS — Z23 Encounter for immunization: Secondary | ICD-10-CM | POA: Diagnosis not present

## 2018-02-22 ENCOUNTER — Encounter: Payer: Self-pay | Admitting: Internal Medicine

## 2018-02-24 ENCOUNTER — Other Ambulatory Visit: Payer: Self-pay | Admitting: Internal Medicine

## 2018-02-24 NOTE — Telephone Encounter (Signed)
Check [place] registry last filled 01/06/2018. MD is out of the office until 03/06/18. Pls advise on refill.Marland KitchenJohny Chess

## 2018-02-24 NOTE — Telephone Encounter (Signed)
I would advise that this wait for pcp as previously he had #14 to last from November to March (30 pills) then 2 months later #30. This represents and acceleration of his usual amount and 2 months would be 7/31 so can wait for pcp. If needed prior to PCP return let me know and I can send in #5.

## 2018-03-27 ENCOUNTER — Other Ambulatory Visit: Payer: Self-pay | Admitting: Internal Medicine

## 2018-03-27 NOTE — Telephone Encounter (Signed)
[place] Controlled Substance Database checked. Last filled on 02/27/18, 8 day supply

## 2018-03-29 ENCOUNTER — Other Ambulatory Visit: Payer: Self-pay | Admitting: Internal Medicine

## 2018-03-30 ENCOUNTER — Other Ambulatory Visit: Payer: Self-pay | Admitting: Internal Medicine

## 2018-04-11 DIAGNOSIS — E119 Type 2 diabetes mellitus without complications: Secondary | ICD-10-CM | POA: Diagnosis not present

## 2018-04-11 DIAGNOSIS — R600 Localized edema: Secondary | ICD-10-CM | POA: Diagnosis not present

## 2018-06-05 ENCOUNTER — Other Ambulatory Visit: Payer: Self-pay | Admitting: Internal Medicine

## 2018-06-05 NOTE — Telephone Encounter (Signed)
Copied from [place] [phone]. Topic: Quick Communication - Rx Refill/Question >> Jun 05, 2018  2:12 PM Sheran Luz wrote: Medication: varenicline (CHANTIX STARTING MONTH PAK) 0.5 MG X 11 & 1 MG X 42 tablet   Pt is requesting a refill of this medication.   Preferred Pharmacy (with phone number or street name):  [facility] ([address] [place] [phone] (Phone) [phone] (Fax)

## 2018-06-06 MED ORDER — VARENICLINE TARTRATE 0.5 MG X 11 & 1 MG X 42 PO MISC: ORAL | 0 refills | Status: DC

## 2018-06-06 NOTE — Telephone Encounter (Signed)
Requested Prescriptions  Pending Prescriptions Disp Refills  . varenicline (CHANTIX STARTING MONTH PAK) 0.5 MG X 11 & 1 MG X 42 tablet 53 tablet 0    Sig: Take one 0.5 mg tablet by mouth QD for 3 days, then increase to one 0.5 mg tablet BID for 4 days, then increase to one 1 mg tablet BID     Psychiatry:  Drug Dependence Therapy Passed - 06/05/2018  2:34 PM      Passed - Valid encounter within last 12 months    Recent Outpatient Visits          5 months ago Preventative health care   [place], MD   7 months ago Left wrist pain   [place], [place], DO   8 months ago TFCC (triangular fibrocartilage complex) injury, left, initial encounter   [place], [place], DO   9 months ago Left wrist pain   [place], [place], DO   10 months ago Left wrist pain   [place], [place], DO      Future Appointments            In 1 month Burns, Claudina Lick, MD [place], [facility]

## 2018-06-08 ENCOUNTER — Other Ambulatory Visit: Payer: Self-pay | Admitting: Internal Medicine

## 2018-06-08 NOTE — Telephone Encounter (Signed)
Last refill was 03/30/18 Last OV was 01/06/18 Next OV is 07/10/2018

## 2018-06-10 ENCOUNTER — Other Ambulatory Visit: Payer: Self-pay | Admitting: Internal Medicine

## 2018-07-08 NOTE — Patient Instructions (Addendum)
  Tests ordered today. Your results will be released to [facility] (or called to you) after review, usually within 72hours after test completion. If any changes need to be made, you will be notified at that same time.   Medications reviewed and updated.  Changes include :   zpak for your cold symptoms.   Your prescription(s) have been submitted to your pharmacy. Please take as directed and contact our office if you believe you are having problem(s) with the medication(s).   Please followup in 6 months

## 2018-07-08 NOTE — Progress Notes (Signed)
Subjective:    Patient ID: Keith [name], male    DOB: 09-19-1955, 62 y.o.   MRN: [id number]  HPI The patient is here for follow up.  Cold symptoms:  It started a couple of weeks ago.  He has a productive cough and nasal congestion.  He has chronic SOB that has not changed.  He denies fever, wheeze, ear pain, sinus pain and sore throat.     He is taking dayquil and nyquil.  He still smokes but has cut back since taking the chantix.   Hypertension: He is taking his medication daily, except for the past 4 days while sick. He is compliant with a low sodium diet.  He denies chest pain, palpitations, edema, and regular headaches. He is not exercising regularly.  He does not monitor his blood pressure at home.    Diabetes: He is taking his medication daily as prescribed. He is compliant with a diabetic diet. He is not exercising regularly.  He checks his feet daily and denies foot lesions, except a bruise on his second left toe. He is up-to-date with an ophthalmology examination.   Hyperlipidemia: He is taking his medication daily. He is compliant with a low fat/cholesterol diet. He is not exercising regularly. He denies myalgias.   Smoking:  He has cut down.  He is still taking chantix.    Lumbar radiculopathy:  He takes tramadol as needed when the back pain is severe.  He typically has pain after certain activities.  He takes the tramadol about 2-3 times a week.    Medications and allergies reviewed with patient and updated if appropriate.  Patient Active Problem List   Diagnosis Date Noted  . TFCC (triangular fibrocartilage complex) injury, left, initial encounter 09/05/2017  . Polyarthropathy 09/05/2017  . Arthritis of left wrist 08/05/2017  . Left wrist pain 07/08/2017  . Anxiety and depression 11/09/2015  . S/P lumbar spinal fusion 05/14/2015  . Alcohol abuse 04/23/2015  . DM (diabetes mellitus) type II controlled with renal manifestation ([place]) 04/23/2015  . Lumbar radiculopathy  01/23/2015  . CARPAL TUNNEL SYNDROME 12/22/2009  . HYPERPLASIA PROSTATE UNS W/O UR OBST & OTH LUTS 12/22/2009  . DIVERTICULOSIS, COLON 10/03/2008  . COLONIC POLYPS, HX OF 10/03/2008  . Hyperlipidemia 08/02/2007  . Tobacco abuse 02/06/2007  . Essential hypertension 02/06/2007    Current Outpatient Medications on File Prior to Visit  Medication Sig Dispense Refill  . atorvastatin (LIPITOR) 20 MG tablet Take 1 tablet (20 mg total) by mouth daily. 90 tablet 1  . cloNIDine (CATAPRES) 0.2 MG tablet Take 1 tablet (0.2 mg total) by mouth 2 (two) times daily. 180 tablet 1  . hydrALAZINE (APRESOLINE) 50 MG tablet Take 2 tablets (100 mg total) by mouth 2 (two) times daily. 120 tablet 5  . losartan-hydrochlorothiazide (HYZAAR) 100-25 MG tablet Take 1 tablet by mouth daily. 90 tablet 1  . metoprolol tartrate (LOPRESSOR) 25 MG tablet Take 1 tablet (25 mg total) by mouth 2 (two) times daily. 180 tablet 1  . traMADol (ULTRAM) 50 MG tablet TAKE 1 TABLET BY MOUTH EVERY 12 HOURS AS NEEDED 60 tablet 0   No current facility-administered medications on file prior to visit.     Past Medical History:  Diagnosis Date  . Anxiety   . Cataract    beginnings   . Chronic kidney disease 04/23/15   ARF- "resolved"  . Colon polyp   . COPD (chronic obstructive pulmonary disease) ([place])    per pt from  smoking   . Dehydration 04/2015   Hospitalized for 4 days after abnormal labs found during preop appointment.  . Depression   . Diabetes mellitus without complication ([place])    S2A 04/24/15- diet controlled   . Diverticulosis   . Hyperlipidemia   . Hypertension   . Tobacco abuse     Past Surgical History:  Procedure Laterality Date  . COLONOSCOPY    . colonoscopy with polypectomy     polyp X 1  . 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 MAXIMUM ACCESS (MAS)POSTERIOR LUMBAR INTERBODY FUSION (PLIF) 2 LEVEL N/A 05/14/2015   Procedure: Lumbar three-lumbar four,  umbar four-lumbar five Maximum  access posterior lumbar interbody fusion;  Surgeon: Eustace Moore, MD;  Location: [facility];  Service: Neurosurgery;  Laterality: N/A;  L3-4 L4-5 Maximum access posterior lumbar interbody fusion  . POLYPECTOMY    . WISDOM TOOTH EXTRACTION      Social History   Socioeconomic History  . Marital status: Married    Spouse name: Not on file  . Number of children: Not on file  . Years of education: Not on file  . Highest education level: Not on file  Occupational History  . Not on file  Social Needs  . Financial resource strain: Not on file  . Food insecurity:    Worry: Not on file    Inability: Not on file  . Transportation needs:    Medical: Not on file    Non-medical: Not on file  Tobacco Use  . Smoking status: Current Every Day Smoker    Packs/day: 1.50    Years: 40.00    Pack years: 60.00    Types: Cigarettes    Last attempt to quit: 04/19/2015    Years since quitting: 3.2  . Smokeless tobacco: Never Used  Substance and Sexual Activity  . Alcohol use: Yes    Alcohol/week: 3.0 standard drinks    Types: 3 Cans of beer per week    Comment: daily drinks- 12 pack on the weekends case/weekend,   . Drug use: No  . Sexual activity: Not on file  Lifestyle  . Physical activity:    Days per week: Not on file    Minutes per session: Not on file  . Stress: Not on file  Relationships  . Social connections:    Talks on phone: Not on file    Gets together: Not on file    Attends religious service: Not on file    Active member of club or organization: Not on file    Attends meetings of clubs or organizations: Not on file    Relationship status: Not on file  Other Topics Concern  . Not on file  Social History Narrative  . Not on file    Family History  Problem Relation Age of Onset  . Stroke Father   . Melanoma Father   . Stomach cancer Father   . Alcohol abuse Father   . Diabetes Mother   . Diabetes Brother   . Breast cancer Sister   . Colon cancer Neg Hx   .  Esophageal cancer Neg Hx   . Rectal cancer Neg Hx   . Colon polyps Neg Hx     Review of Systems  Constitutional: Negative for chills and fever.  HENT: Positive for congestion. Negative for ear pain, sinus pain and sore throat.   Respiratory: Positive for cough (productive) and shortness of breath (  not new). Negative for wheezing.   Cardiovascular: Negative for chest pain, palpitations and leg swelling.  Neurological: Negative for dizziness, light-headedness and headaches.       Objective:   Vitals:   07/10/18 0807  BP: (!) 152/84  Pulse: 69  Resp: 16  Temp: 98.4 F (36.9 C)  SpO2: 98%   BP Readings from Last 3 Encounters:  07/10/18 (!) 152/84  01/06/18 120/62  12/30/17 130/75   Wt Readings from Last 3 Encounters:  07/10/18 175 lb (79.4 kg)  01/06/18 177 lb (80.3 kg)  12/30/17 174 lb (78.9 kg)   Body mass index is 23.73 kg/m.   Physical Exam    Constitutional: Appears well-developed and well-nourished. No distress.  HENT:  Head: Normocephalic and atraumatic.  Neck: Neck supple. No tracheal deviation present. No thyromegaly present.  No cervical lymphadenopathy Cardiovascular: Normal rate, regular rhythm and normal heart sounds.   No murmur heard. No carotid bruit .  No edema Pulmonary/Chest: Effort normal and breath sounds normal. No respiratory distress. No has no wheezes. No rales.  Skin: Skin is warm and dry. Not diaphoretic.  Psychiatric: Normal mood and affect. Behavior is normal.   Diabetic Foot Exam - Simple   Simple Foot Form Diabetic Foot exam was performed with the following findings:  Yes 07/10/2018  8:31 AM  Visual Inspection No deformities, no ulcerations, no other skin breakdown bilaterally:  Yes Sensation Testing Intact to touch and monofilament testing bilaterally:  Yes Pulse Check Posterior Tibialis and Dorsalis pulse intact bilaterally:  Yes Comments Non-tender bruise left second toe       Assessment & Plan:    See Problem List for  Assessment and Plan of chronic medical problems.

## 2018-07-10 ENCOUNTER — Encounter: Payer: Self-pay | Admitting: Internal Medicine

## 2018-07-10 ENCOUNTER — Ambulatory Visit: Payer: 59 | Admitting: Internal Medicine

## 2018-07-10 ENCOUNTER — Other Ambulatory Visit (INDEPENDENT_AMBULATORY_CARE_PROVIDER_SITE_OTHER): Payer: 59

## 2018-07-10 VITALS — BP 152/84 | HR 69 | Temp 98.4°F | Resp 16 | Ht 72.0 in | Wt 175.0 lb

## 2018-07-10 DIAGNOSIS — M5416 Radiculopathy, lumbar region: Secondary | ICD-10-CM | POA: Diagnosis not present

## 2018-07-10 DIAGNOSIS — I10 Essential (primary) hypertension: Secondary | ICD-10-CM

## 2018-07-10 DIAGNOSIS — E1122 Type 2 diabetes mellitus with diabetic chronic kidney disease: Secondary | ICD-10-CM

## 2018-07-10 DIAGNOSIS — F32A Depression, unspecified: Secondary | ICD-10-CM

## 2018-07-10 DIAGNOSIS — F329 Major depressive disorder, single episode, unspecified: Secondary | ICD-10-CM

## 2018-07-10 DIAGNOSIS — F419 Anxiety disorder, unspecified: Secondary | ICD-10-CM

## 2018-07-10 DIAGNOSIS — J209 Acute bronchitis, unspecified: Secondary | ICD-10-CM | POA: Insufficient documentation

## 2018-07-10 DIAGNOSIS — E7849 Other hyperlipidemia: Secondary | ICD-10-CM

## 2018-07-10 DIAGNOSIS — Z720 Tobacco use: Secondary | ICD-10-CM

## 2018-07-10 LAB — COMPREHENSIVE METABOLIC PANEL
ALBUMIN: 4.2 g/dL (ref 3.5–5.2)
ALT: 20 U/L (ref 0–53)
AST: 17 U/L (ref 0–37)
Alkaline Phosphatase: 63 U/L (ref 39–117)
BUN: 11 mg/dL (ref 6–23)
CHLORIDE: 99 meq/L (ref 96–112)
CO2: 27 mEq/L (ref 19–32)
CREATININE: 1.16 mg/dL (ref 0.40–1.50)
Calcium: 9.3 mg/dL (ref 8.4–10.5)
GFR: 67.26 mL/min (ref >60.00)
Glucose, Bld: 161 mg/dL — ABNORMAL HIGH (ref 70–99)
POTASSIUM: 4 meq/L (ref 3.5–5.1)
Sodium: 136 mEq/L (ref 135–145)
Total Bilirubin: 0.6 mg/dL (ref 0.2–1.2)
Total Protein: 7.5 g/dL (ref 6.0–8.3)

## 2018-07-10 LAB — HEMOGLOBIN A1C: Hgb A1c MFr Bld: 6.5 % (ref 4.6–6.5)

## 2018-07-10 MED ORDER — VARENICLINE TARTRATE 1 MG PO TABS: 1.0000 mg | ORAL_TABLET | Freq: Two times a day (BID) | ORAL | 1 refills | Status: DC

## 2018-07-10 MED ORDER — AZITHROMYCIN 250 MG PO TABS: ORAL_TABLET | ORAL | 0 refills | Status: DC

## 2018-07-10 NOTE — Assessment & Plan Note (Signed)
Taking chantix but still smoking some - stressed smoking cessation completely - he will continue to work on it and knows he needs to quit

## 2018-07-10 NOTE — Assessment & Plan Note (Signed)
Chronic, intermittent back pain Takes tramadol only for severe pain - about 2-3 times a week Will continue tramadol as needed

## 2018-07-10 NOTE — Assessment & Plan Note (Signed)
Stressed smoking cessation Likely bacterial  Start zpak otc cold medications Rest, fluid Call if no improvement

## 2018-07-10 NOTE — Assessment & Plan Note (Signed)
Continue daily statin Regular exercise and healthy diet encouraged  

## 2018-07-10 NOTE — Assessment & Plan Note (Signed)
Feels depressed Does not want to do medication

## 2018-07-10 NOTE — Assessment & Plan Note (Addendum)
BP elevated here today, but typically better controlled - he has not taken his BP meds for the past 4 days - he has been sick and did not take them Stressed the importance of taking his medications daily Current regimen effective and well tolerated Continue current medications at current doses cmp

## 2018-07-10 NOTE — Assessment & Plan Note (Signed)
Check a1c Low sugar / carb diet Stressed regular exercise   

## 2018-07-24 ENCOUNTER — Other Ambulatory Visit: Payer: Self-pay | Admitting: Internal Medicine

## 2018-07-24 NOTE — Telephone Encounter (Signed)
Last OV 07/10/18  [place] Controlled Substance Database checked. Last filled on 06/08/18

## 2018-07-26 ENCOUNTER — Other Ambulatory Visit: Payer: Self-pay | Admitting: Internal Medicine

## 2018-07-26 NOTE — Telephone Encounter (Signed)
Last refill was 06/08/18. Last OV was 07/10/18 Next OV 01/15/19

## 2018-08-18 ENCOUNTER — Other Ambulatory Visit: Payer: Self-pay | Admitting: Internal Medicine

## 2018-08-18 MED ORDER — VARENICLINE TARTRATE 1 MG PO TABS: 1.0000 mg | ORAL_TABLET | Freq: Two times a day (BID) | ORAL | 1 refills | Status: DC

## 2018-08-18 NOTE — Telephone Encounter (Signed)
Rx resent to pharmacy per patient request.

## 2018-08-18 NOTE — Telephone Encounter (Signed)
Copied from [facility] [id number]. Topic: Quick Communication - Rx Refill/Question >> Aug 18, 2018  4:04 PM Bea Graff, NT wrote: Medication: varenicline (CHANTIX CONTINUING MONTH PAK) 1 MG tablet  Pt states that the pharmacy did not receive the request for refill on 07/10/18  Has the patient contacted their pharmacy? Yes.   (Agent: If no, request that the patient contact the pharmacy for the refill.) (Agent: If yes, when and what did the pharmacy advise?)  Preferred Pharmacy (with phone number or street name): [facility] ([address] [place] [phone] (Phone) [phone] (Fax)    Agent: Please be advised that RX refills may take up to 3 business days. We ask that you follow-up with your pharmacy.

## 2018-09-01 ENCOUNTER — Other Ambulatory Visit: Payer: Self-pay | Admitting: Internal Medicine

## 2018-09-01 NOTE — Telephone Encounter (Signed)
Last OV was 07/10/18 Next OV 01/15/19 Last refill was 07/25/18

## 2018-09-01 NOTE — Telephone Encounter (Signed)
Medication not delegated to NT for refill

## 2018-09-01 NOTE — Telephone Encounter (Signed)
Would recommend trying a different pharmacy.

## 2018-09-01 NOTE — Telephone Encounter (Signed)
Copied from [place] [id number]. Topic: Quick Communication - Rx Refill/Question >> Sep 01, 2018  2:27 PM [place], [place] D wrote: Medication: traMADol (ULTRAM) 50 MG tablet / Pt stated that pharmacy informed him tramadol is on back order until mid February. He would like to know if Dr. Quay Burow can send in something in it's place for his chronic back pain. Please advise.  Has the patient contacted their pharmacy? Yes (Agent: If no, request that the patient contact the pharmacy for the refill.) (Agent: If yes, when and what did the pharmacy advise?)  Preferred Pharmacy (with phone number or street name): [facility] ([address]), [place] - [place] [phone] (Phone) [phone] (Fax)    Agent: Please be advised that RX refills may take up to 3 business days. We ask that you follow-up with your pharmacy.

## 2018-09-04 MED ORDER — LOSARTAN POTASSIUM-HCTZ 100-25 MG PO TABS: 1.0000 | ORAL_TABLET | Freq: Every day | ORAL | 1 refills | Status: DC

## 2018-09-04 MED ORDER — TRAMADOL HCL 50 MG PO TABS: 50.0000 mg | ORAL_TABLET | Freq: Two times a day (BID) | ORAL | 0 refills | Status: DC | PRN

## 2018-09-04 NOTE — Telephone Encounter (Addendum)
Pt stated [facility] [address] has traMADol (ULTRAM) 50 MG tablet  and losartan-hydrochlorothiazide (HYZAAR) 100-25 MG tablet  in stock. Please advise

## 2018-09-04 NOTE — Addendum Note (Signed)
Addended by: Delice Bison E on: 09/04/2018 12:56 PM   Modules accepted: Orders

## 2018-09-04 NOTE — Telephone Encounter (Signed)
LVM advising pt to try a different pharmacy. Did not leave detailed message.

## 2018-09-25 ENCOUNTER — Other Ambulatory Visit: Payer: Self-pay | Admitting: Internal Medicine

## 2018-11-13 ENCOUNTER — Other Ambulatory Visit: Payer: Self-pay | Admitting: Internal Medicine

## 2018-11-13 NOTE — Telephone Encounter (Signed)
Per database tramadol was last filled on 10/06/2018.  LOV with PCP 07/10/2018

## 2018-12-27 ENCOUNTER — Telehealth: Payer: Self-pay | Admitting: Internal Medicine

## 2018-12-27 NOTE — Telephone Encounter (Signed)
Last OV 07/10/18 Next OV 01/15/19  [place] Controlled Substance Database checked. Last filled on 11/13/18

## 2018-12-29 MED ORDER — TRAMADOL HCL 50 MG PO TABS: 50.0000 mg | ORAL_TABLET | Freq: Two times a day (BID) | ORAL | 0 refills | Status: DC | PRN

## 2018-12-29 NOTE — Telephone Encounter (Signed)
Resent

## 2018-12-29 NOTE — Addendum Note (Signed)
Addended by: Binnie Rail on: 12/29/2018 04:29 PM   Modules accepted: Orders

## 2018-12-29 NOTE — Telephone Encounter (Signed)
Patient is calling to check on status of medication.  Patient does state he does not have any other pills. Wants PCP or nurse to give him a call back [phone]

## 2018-12-29 NOTE — Telephone Encounter (Signed)
Can you resend this?

## 2019-01-06 ENCOUNTER — Other Ambulatory Visit: Payer: Self-pay | Admitting: Internal Medicine

## 2019-01-13 NOTE — Progress Notes (Signed)
Subjective:    Patient ID: Keith [name], male    DOB: [birth year], 63 y.o.   MRN: [id number]  HPI He is here for a physical exam.   He does not take his medications on the weekend when he is at home - he takes them during the week when he works.   He last took his medications Friday morning.    His BP is controlled when he takes his medication.  He does monitor it.  He does not watch his sugars/carbs.  He eats what he wants.     Medications and allergies reviewed with patient and updated if appropriate.  Patient Active Problem List   Diagnosis Date Noted  . Acute bronchitis 07/10/2018  . TFCC (triangular fibrocartilage complex) injury, left, initial encounter 09/05/2017  . Polyarthropathy 09/05/2017  . Arthritis of left wrist 08/05/2017  . Left wrist pain 07/08/2017  . Anxiety and depression 11/09/2015  . S/P lumbar spinal fusion 05/14/2015  . Alcohol abuse 04/23/2015  . DM (diabetes mellitus) type II controlled with renal manifestation ([place]) 04/23/2015  . Lumbar radiculopathy 01/23/2015  . CARPAL TUNNEL SYNDROME 12/22/2009  . HYPERPLASIA PROSTATE UNS W/O UR OBST & OTH LUTS 12/22/2009  . DIVERTICULOSIS, COLON 10/03/2008  . COLONIC POLYPS, HX OF 10/03/2008  . Hyperlipidemia 08/02/2007  . Tobacco abuse 02/06/2007  . Essential hypertension 02/06/2007    Current Outpatient Medications on File Prior to Visit  Medication Sig Dispense Refill  . atorvastatin (LIPITOR) 20 MG tablet Take 1 tablet (20 mg total) by mouth daily. 90 tablet 1  . cloNIDine (CATAPRES) 0.2 MG tablet Take 1 tablet (0.2 mg total) by mouth 2 (two) times daily. 180 tablet 1  . hydrALAZINE (APRESOLINE) 50 MG tablet TAKE 2 TABLETS BY MOUTH TWICE DAILY 360 tablet 0  . losartan-hydrochlorothiazide (HYZAAR) 100-25 MG tablet Take 1 tablet by mouth daily. 90 tablet 1  . metoprolol tartrate (LOPRESSOR) 25 MG tablet Take 1 tablet by mouth twice daily 60 tablet 0  . traMADol (ULTRAM) 50 MG tablet Take 1 tablet (50  mg total) by mouth every 12 (twelve) hours as needed. 60 tablet 0   No current facility-administered medications on file prior to visit.     Past Medical History:  Diagnosis Date  . Anxiety   . Cataract    beginnings   . Chronic kidney disease 04/23/15   ARF- "resolved"  . Colon polyp   . COPD (chronic obstructive pulmonary disease) ([facility])    per pt from smoking   . Dehydration 04/2015   Hospitalized for 4 days after abnormal labs found during preop appointment.  . Depression   . Diabetes mellitus without complication ([place])    B8G 04/24/15- diet controlled   . Diverticulosis   . Hyperlipidemia   . Hypertension   . Tobacco abuse     Past Surgical History:  Procedure Laterality Date  . COLONOSCOPY    . colonoscopy with polypectomy     polyp X 1  . 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 MAXIMUM ACCESS (MAS)POSTERIOR LUMBAR INTERBODY FUSION (PLIF) 2 LEVEL N/A 05/14/2015   Procedure: Lumbar three-lumbar four,  umbar four-lumbar five Maximum access posterior lumbar interbody fusion;  Surgeon: Eustace Moore, MD;  Location: [facility];  Service: Neurosurgery;  Laterality: N/A;  L3-4 L4-5 Maximum access posterior lumbar interbody fusion  . POLYPECTOMY    . [place] EXTRACTION      Social History  Socioeconomic History  . Marital status: Married    Spouse name: Not on file  . Number of children: Not on file  . Years of education: Not on file  . Highest education level: Not on file  Occupational History  . Not on file  Social Needs  . Financial resource strain: Not on file  . Food insecurity:    Worry: Not on file    Inability: Not on file  . Transportation needs:    Medical: Not on file    Non-medical: Not on file  Tobacco Use  . Smoking status: Current Every Day Smoker    Packs/day: 1.50    Years: 40.00    Pack years: 60.00    Types: Cigarettes    Last attempt to quit: 04/19/2015    Years since quitting: 3.7  . Smokeless tobacco: Never  Used  Substance and Sexual Activity  . Alcohol use: Yes    Alcohol/week: 3.0 standard drinks    Types: 3 Cans of beer per week    Comment: daily drinks- 12 pack on the weekends case/weekend,   . Drug use: No  . Sexual activity: Not on file  Lifestyle  . Physical activity:    Days per week: Not on file    Minutes per session: Not on file  . Stress: Not on file  Relationships  . Social connections:    Talks on phone: Not on file    Gets together: Not on file    Attends religious service: Not on file    Active member of club or organization: Not on file    Attends meetings of clubs or organizations: Not on file    Relationship status: Not on file  Other Topics Concern  . Not on file  Social History Narrative  . Not on file    Family History  Problem Relation Age of Onset  . Stroke Father   . Melanoma Father   . Stomach cancer Father   . Alcohol abuse Father   . Diabetes Mother   . Diabetes Brother   . Breast cancer Sister   . Colon cancer Neg Hx   . Esophageal cancer Neg Hx   . Rectal cancer Neg Hx   . Colon polyps Neg Hx     Review of Systems  Constitutional: Negative for chills and fever.  Eyes: Negative for visual disturbance.  Respiratory: Positive for cough (from smoking). Negative for shortness of breath and wheezing.   Cardiovascular: Negative for chest pain, palpitations and leg swelling.  Gastrointestinal: Positive for abdominal pain (occ LUQ pain - last few min) and constipation. Negative for blood in stool, diarrhea and nausea.       No gerd  Genitourinary: Positive for frequency (nocturia - less volume). Negative for difficulty urinating, dysuria and hematuria.  Musculoskeletal: Positive for back pain.  Skin: Negative for color change and rash.  Neurological: Negative for light-headedness and headaches.  Psychiatric/Behavioral: Positive for dysphoric mood. The patient is nervous/anxious.        Objective:   Vitals:   01/15/19 0906  BP: (!) 174/92   Pulse: 79  Resp: 16  Temp: 98.6 F (37 C)  SpO2: 98%   Filed Weights   01/15/19 0906  Weight: 179 lb (81.2 kg)   Body mass index is 24.28 kg/m.  Wt Readings from Last 3 Encounters:  01/15/19 179 lb (81.2 kg)  07/10/18 175 lb (79.4 kg)  01/06/18 177 lb (80.3 kg)     Physical Exam Constitutional:  He appears well-developed and well-nourished. No distress.  HENT:  Head: Normocephalic and atraumatic.  Right Ear: External ear normal.  Left Ear: External ear normal.  Mouth/Throat: Oropharynx is clear and moist.  Normal ear canals and TM b/l  Eyes: Conjunctivae and EOM are normal.  Neck: Neck supple. No tracheal deviation present. No thyromegaly present.  No carotid bruit  Cardiovascular: Normal rate, regular rhythm, normal heart sounds and intact distal pulses.  No murmur heard. Pulmonary/Chest: Effort normal and breath sounds normal. No respiratory distress. He has no wheezes. He has no rales.  Abdominal: Soft. He exhibits no distension. There is no tenderness.  Genitourinary: deferred  Musculoskeletal: He exhibits no edema.  Lymphadenopathy:   He has no cervical adenopathy.  Skin: Skin is warm and dry. He is not diaphoretic.  Psychiatric: He has a normal mood and affect. His behavior is normal.         Assessment & Plan:   Physical exam: Screening blood work  ordered Immunizations had shingrix, prenvar today , others up to date Colonoscopy    Up to date  Eye exams  Not up to date  -- scheduled Exercise    None, yard work Weight    Normal BMI Skin   No concerns Substance abuse  Smoking, binge drinking on weekends    See Problem List for Assessment and Plan of chronic medical problems.    FU in 6 months

## 2019-01-13 NOTE — Patient Instructions (Addendum)
Tests ordered today. Your results will be released to [facility] (or called to you) after review, usually within 72hours after test completion. If any changes need to be made, you will be notified at that same time.  All other Health Maintenance issues reviewed.   All recommended immunizations and age-appropriate screenings are up-to-date or discussed.  Prevnar pneumonia immunization administered today.    Medications reviewed and updated.  Changes include :   Start fluoxetine 20 mg daily   Please followup in 6 months     Health Maintenance, Male A healthy lifestyle and preventive care is important for your health and wellness. Ask your health care provider about what schedule of regular examinations is right for you. What should I know about weight and diet? Eat a Healthy Diet  Eat plenty of vegetables, fruits, whole grains, low-fat dairy products, and lean protein.  Do not eat a lot of foods high in solid fats, added sugars, or salt.  Maintain a Healthy Weight Regular exercise can help you achieve or maintain a healthy weight. You should:  Do at least 150 minutes of exercise each week. The exercise should increase your heart rate and make you sweat (moderate-intensity exercise).  Do strength-training exercises at least twice a week. Watch Your Levels of Cholesterol and Blood Lipids  Have your blood tested for lipids and cholesterol every 5 years starting at 65 years of age. If you are at high risk for heart disease, you should start having your blood tested when you are 65 years old. You may need to have your cholesterol levels checked more often if: ? Your lipid or cholesterol levels are high. ? You are older than 65 years of age. ? You are at high risk for heart disease. What should I know about cancer screening? Many types of cancers can be detected early and may often be prevented. Lung Cancer  You should be screened every year for lung cancer if: ? You are a current  smoker who has smoked for at least 30 years. ? You are a former smoker who has quit within the past 15 years.  Talk to your health care provider about your screening options, when you should start screening, and how often you should be screened. Colorectal Cancer  Routine colorectal cancer screening usually begins at 65 years of age and should be repeated every 5-10 years until you are 65 years old. You may need to be screened more often if early forms of precancerous polyps or small growths are found. Your health care provider may recommend screening at an earlier age if you have risk factors for colon cancer.  Your health care provider may recommend using home test kits to check for hidden blood in the stool.  A small camera at the end of a tube can be used to examine your colon (sigmoidoscopy or colonoscopy). This checks for the earliest forms of colorectal cancer. Prostate and Testicular Cancer  Depending on your age and overall health, your health care provider may do certain tests to screen for prostate and testicular cancer.  Talk to your health care provider about any symptoms or concerns you have about testicular or prostate cancer. Skin Cancer  Check your skin from head to toe regularly.  Tell your health care provider about any new moles or changes in moles, especially if: ? There is a change in a mole's size, shape, or color. ? You have a mole that is larger than a pencil eraser.  Always use sunscreen. Apply  sunscreen liberally and repeat throughout the day.  Protect yourself by wearing long sleeves, pants, a wide-brimmed hat, and sunglasses when outside. What should I know about heart disease, diabetes, and high blood pressure?  If you are 17-84 years of age, have your blood pressure checked every 3-5 years. If you are 53 years of age or older, have your blood pressure checked every year. You should have your blood pressure measured twice-once when you are at a hospital or  clinic, and once when you are not at a hospital or clinic. Record the average of the two measurements. To check your blood pressure when you are not at a hospital or clinic, you can use: ? An automated blood pressure machine at a pharmacy. ? A home blood pressure monitor.  Talk to your health care provider about your target blood pressure.  If you are between 31-18 years old, ask your health care provider if you should take aspirin to prevent heart disease.  Have regular diabetes screenings by checking your fasting blood sugar level. ? If you are at a normal weight and have a low risk for diabetes, have this test once every three years after the age of 59. ? If you are overweight and have a high risk for diabetes, consider being tested at a younger age or more often.  A one-time screening for abdominal aortic aneurysm (AAA) by ultrasound is recommended for men aged 80-75 years who are current or former smokers. What should I know about preventing infection? Hepatitis B If you have a higher risk for hepatitis B, you should be screened for this virus. Talk with your health care provider to find out if you are at risk for hepatitis B infection. Hepatitis C Blood testing is recommended for:  Everyone born from [birth year] through 1967.  Anyone with known risk factors for hepatitis C. Sexually Transmitted Diseases (STDs)  You should be screened each year for STDs including gonorrhea and chlamydia if: ? You are sexually active and are younger than 65 years of age. ? You are older than 65 years of age and your health care provider tells you that you are at risk for this type of infection. ? Your sexual activity has changed since you were last screened and you are at an increased risk for chlamydia or gonorrhea. Ask your health care provider if you are at risk.  Talk with your health care provider about whether you are at high risk of being infected with HIV. Your health care provider may recommend a  prescription medicine to help prevent HIV infection. What else can I do?  Schedule regular health, dental, and eye exams.  Stay current with your vaccines (immunizations).  Do not use any tobacco products, such as cigarettes, chewing tobacco, and e-cigarettes. If you need help quitting, ask your health care provider.  Limit alcohol intake to no more than 2 drinks per day. One drink equals 12 ounces of beer, 5 ounces of wine, or 1 ounces of hard liquor.  Do not use street drugs.  Do not share needles.  Ask your health care provider for help if you need support or information about quitting drugs.  Tell your health care provider if you often feel depressed.  Tell your health care provider if you have ever been abused or do not feel safe at home. This information is not intended to replace advice given to you by your health care provider. Make sure you discuss any questions you have with your health care  provider. Document Released: 01/22/2008 Document Revised: 03/24/2016 Document Reviewed: 04/29/2015 Elsevier Interactive Patient Education  2019 [employer].

## 2019-01-15 ENCOUNTER — Encounter: Payer: Self-pay | Admitting: Internal Medicine

## 2019-01-15 ENCOUNTER — Other Ambulatory Visit (INDEPENDENT_AMBULATORY_CARE_PROVIDER_SITE_OTHER): Payer: 59

## 2019-01-15 ENCOUNTER — Other Ambulatory Visit: Payer: Self-pay

## 2019-01-15 ENCOUNTER — Ambulatory Visit (INDEPENDENT_AMBULATORY_CARE_PROVIDER_SITE_OTHER): Payer: 59 | Admitting: Internal Medicine

## 2019-01-15 VITALS — BP 174/92 | HR 79 | Temp 98.6°F | Resp 16 | Ht 72.0 in | Wt 179.0 lb

## 2019-01-15 DIAGNOSIS — E1122 Type 2 diabetes mellitus with diabetic chronic kidney disease: Secondary | ICD-10-CM

## 2019-01-15 DIAGNOSIS — Z23 Encounter for immunization: Secondary | ICD-10-CM | POA: Diagnosis not present

## 2019-01-15 DIAGNOSIS — M5416 Radiculopathy, lumbar region: Secondary | ICD-10-CM

## 2019-01-15 DIAGNOSIS — I10 Essential (primary) hypertension: Secondary | ICD-10-CM | POA: Diagnosis not present

## 2019-01-15 DIAGNOSIS — Z0000 Encounter for general adult medical examination without abnormal findings: Secondary | ICD-10-CM

## 2019-01-15 DIAGNOSIS — Z720 Tobacco use: Secondary | ICD-10-CM

## 2019-01-15 DIAGNOSIS — E7849 Other hyperlipidemia: Secondary | ICD-10-CM

## 2019-01-15 DIAGNOSIS — F3289 Other specified depressive episodes: Secondary | ICD-10-CM

## 2019-01-15 LAB — COMPREHENSIVE METABOLIC PANEL
ALT: 12 U/L (ref 0–53)
AST: 17 U/L (ref 0–37)
Albumin: 4.4 g/dL (ref 3.5–5.2)
Alkaline Phosphatase: 66 U/L (ref 39–117)
BUN: 10 mg/dL (ref 6–23)
CO2: 29 mEq/L (ref 19–32)
Calcium: 9.7 mg/dL (ref 8.4–10.5)
Chloride: 103 mEq/L (ref 96–112)
Creatinine, Ser: 1.04 mg/dL (ref 0.40–1.50)
GFR: 71.67 mL/min (ref >60.00)
Glucose, Bld: 107 mg/dL — ABNORMAL HIGH (ref 70–99)
Potassium: 4.7 mEq/L (ref 3.5–5.1)
Sodium: 140 mEq/L (ref 135–145)
Total Bilirubin: 0.3 mg/dL (ref 0.2–1.2)
Total Protein: 7.4 g/dL (ref 6.0–8.3)

## 2019-01-15 LAB — CBC WITH DIFFERENTIAL/PLATELET
Basophils Absolute: 0.1 10*3/uL (ref 0.0–0.1)
Basophils Relative: 0.7 % (ref 0.0–3.0)
Eosinophils Absolute: 0.3 10*3/uL (ref 0.0–0.7)
Eosinophils Relative: 4 % (ref 0.0–5.0)
HCT: 48.1 % (ref 39.0–52.0)
Hemoglobin: 16.5 g/dL (ref 13.0–17.0)
Lymphocytes Relative: 26.3 % (ref 12.0–46.0)
Lymphs Abs: 1.8 10*3/uL (ref 0.7–4.0)
MCHC: 34.3 g/dL (ref 30.0–36.0)
MCV: 96.8 fl (ref 78.0–100.0)
Monocytes Absolute: 0.7 10*3/uL (ref 0.1–1.0)
Monocytes Relative: 10.8 % (ref 3.0–12.0)
Neutro Abs: 4 10*3/uL (ref 1.4–7.7)
Neutrophils Relative %: 58.2 % (ref 43.0–77.0)
Platelets: 197 10*3/uL (ref 150.0–400.0)
RBC: 4.97 Mil/uL (ref 4.22–5.81)
RDW: 14.7 % (ref 11.5–15.5)
WBC: 6.8 10*3/uL (ref 4.0–10.5)

## 2019-01-15 LAB — TSH: TSH: 0.79 u[IU]/mL (ref 0.35–4.50)

## 2019-01-15 LAB — LIPID PANEL
Cholesterol: 156 mg/dL (ref 0–200)
HDL: 36.8 mg/dL — ABNORMAL LOW (ref >39.00)
LDL Cholesterol: 94 mg/dL (ref 0–99)
NonHDL: 119.55
Total CHOL/HDL Ratio: 4
Triglycerides: 127 mg/dL (ref 0.0–149.0)
VLDL: 25.4 mg/dL (ref 0.0–40.0)

## 2019-01-15 LAB — HEMOGLOBIN A1C: Hgb A1c MFr Bld: 6.3 % (ref 4.6–6.5)

## 2019-01-15 MED ORDER — FLUOXETINE HCL 20 MG PO TABS: 20.0000 mg | ORAL_TABLET | Freq: Every day | ORAL | 5 refills | Status: DC

## 2019-01-15 NOTE — Assessment & Plan Note (Signed)
Diet controlled - does not watch what he eats Check a1c Low sugar / carb diet Stressed regular exercise

## 2019-01-15 NOTE — Assessment & Plan Note (Signed)
Elevated here but has not taken his medication for the past three days Controlled when he takes the medication Stressed compliance with medication and discussed risk of uncontrolled BP No change in medications cmp

## 2019-01-15 NOTE — Assessment & Plan Note (Signed)
He is not interested in trying to quit at this time

## 2019-01-15 NOTE — Assessment & Plan Note (Addendum)
Chronic More constant Taking tramadol as needed-maximum 2/day We will continue tramadol

## 2019-01-15 NOTE — Assessment & Plan Note (Addendum)
Has depression, but denies anxiety Does not want to be on medication, but is willing to try something -he is hoping that this will help him refrain from saying things or things he does say Start prozac 20 mg daily Discussed that he should not be drinking as much alcohol-this will decrease the effectiveness of the Prozac, but it is also a depressant and he realizes that he is likely self-medicating with the alcohol.  He does have a family history of alcoholism.  He will try to cut down

## 2019-01-15 NOTE — Assessment & Plan Note (Signed)
Check lipid panel, tsh  Continue daily statin Regular exercise and healthy diet encouraged

## 2019-01-16 LAB — PSA, TOTAL AND FREE
PSA, % Free: 20 % (calc) — ABNORMAL LOW (ref >25)
PSA, Free: 0.3 ng/mL
PSA, Total: 1.5 ng/mL (ref <=4.0)

## 2019-01-18 ENCOUNTER — Encounter: Payer: Self-pay | Admitting: *Deleted

## 2019-02-06 ENCOUNTER — Other Ambulatory Visit: Payer: Self-pay | Admitting: Internal Medicine

## 2019-02-06 NOTE — Telephone Encounter (Signed)
[place] Controlled Database Checked Last filled: 12/27/18 # 60 LOV w/you: 01/15/19 Next appt w/you: 07/18/19

## 2019-03-14 ENCOUNTER — Other Ambulatory Visit: Payer: Self-pay | Admitting: Internal Medicine

## 2019-03-14 NOTE — Telephone Encounter (Signed)
Last OV 01/15/19 Next OV 07/18/19 Last RF 02/06/19

## 2019-03-19 ENCOUNTER — Other Ambulatory Visit: Payer: Self-pay | Admitting: Internal Medicine

## 2019-03-22 ENCOUNTER — Telehealth: Payer: Self-pay | Admitting: *Deleted

## 2019-03-22 MED ORDER — HYDRALAZINE HCL 50 MG PO TABS: 100.0000 mg | ORAL_TABLET | Freq: Three times a day (TID) | ORAL | 0 refills | Status: DC

## 2019-03-22 NOTE — Telephone Encounter (Signed)
Yes, ok to increase hydralazine to TID - med list updated.  If BP is not controlled he should make an appt

## 2019-03-22 NOTE — Telephone Encounter (Signed)
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  He states he once took his hydralazine 2 tabs tid in the past and it was decreased to 2 bid which is currently what he takes. He wants to know if he can increase to this dose again to get his readings down quickly.   He states he just recently started back taking his Hyzaar 100-25 at the beginning of this week. He is also taking clonidine bid and metoprolol bid. Please advise.

## 2019-03-22 NOTE — Telephone Encounter (Signed)
Pt returned call to office. Attempted to transfer pt to the office but received the voicemail recording with office hours. Pt requests call back. Cb# [phone]

## 2019-03-22 NOTE — Telephone Encounter (Signed)
Copied from [place] [id number]. Topic: General - Other >> Mar 22, 2019 10:05 AM Rainey Pines A wrote: Patient would like to speak with Lovena Le in regards to his medication

## 2019-03-22 NOTE — Telephone Encounter (Signed)
Pt informed of below.  

## 2019-03-22 NOTE — Telephone Encounter (Signed)
Left message for patient to call back  

## 2019-04-02 ENCOUNTER — Other Ambulatory Visit: Payer: Self-pay | Admitting: Internal Medicine

## 2019-04-24 ENCOUNTER — Other Ambulatory Visit: Payer: Self-pay | Admitting: Internal Medicine

## 2019-04-24 NOTE — Telephone Encounter (Signed)
[place] Controlled Database Checked Last filled: 03/15/19 # 60 LOV w/you: 01/15/19 Next appt w/you: 07/18/19

## 2019-05-11 ENCOUNTER — Other Ambulatory Visit: Payer: Self-pay | Admitting: Internal Medicine

## 2019-05-30 ENCOUNTER — Other Ambulatory Visit: Payer: Self-pay | Admitting: Internal Medicine

## 2019-05-30 NOTE — Telephone Encounter (Signed)
Last OV 01/15/19 Next OV 07/18/19 Last RF 04/24/19

## 2019-07-03 ENCOUNTER — Other Ambulatory Visit: Payer: Self-pay | Admitting: Internal Medicine

## 2019-07-03 NOTE — Telephone Encounter (Signed)
[place] Controlled Database Checked Last filled: 05/31/19 # 60 LOV w/you: 01/15/19 Next appt w/you: 07/18/19

## 2019-07-15 NOTE — Progress Notes (Signed)
Subjective:    Patient ID: Keith [name], male    DOB: 09-Aug-1956, 63 y.o.   MRN: [id number]  HPI The patient is here for follow up.   He is not exercising regularly.     His left wrist is bothering him.  His first two fingers tingles and he has pain.  He is seeing Dr Tamala Julian.  He has been diagnosed with carpal tunnel and has received injections and thinks he needs another injection.  Left 4th toe has decreased flexibility, but it it not painful.  He dropped something on it a while ago (6-7 months ago).  The left foot is more swollen than the right.  It does not hurt.    Chronic lower back pain from lumbar radiculpathy:  He is taking tramadol for his pain.  The medication does help.   He does not take the medication if he drinks alcohol.  He knows he is not allowed to do that.  Hypertension: He has not taken his medication for a few days.  He just has not taken them.  He is not always compliant with a low sodium diet.  He denies chest pain, palpitations, edema, shortness of breath and regular headaches.    Diabetes: He is controlling his sugars with lifestyle. He is compliant with a diabetic diet.  He does not exercise on a regular basis..    Hyperlipidemia: He is taking his medication daily. He is compliant with a low fat/cholesterol diet. He denies myalgias.   Smoking:   He smokes 1.5 ppd.  He knows he should quit, but cannot tolerate Chantix and does not think he can quit on his own.  Depression: He stopped taking the fluoxetine.  He does not think he needs the medication and does not want to restart it.   Medications and allergies reviewed with patient and updated if appropriate.  Patient Active Problem List   Diagnosis Date Noted  . TFCC (triangular fibrocartilage complex) injury, left, initial encounter 09/05/2017  . Polyarthropathy 09/05/2017  . Arthritis of left wrist 08/05/2017  . Left wrist pain 07/08/2017  . Depression 11/09/2015  . S/P lumbar spinal fusion  05/14/2015  . Alcohol abuse 04/23/2015  . DM (diabetes mellitus) type II controlled with renal manifestation ([place]) 04/23/2015  . Lumbar radiculopathy 01/23/2015  . CARPAL TUNNEL SYNDROME 12/22/2009  . HYPERPLASIA PROSTATE UNS W/O UR OBST & OTH LUTS 12/22/2009  . DIVERTICULOSIS, COLON 10/03/2008  . COLONIC POLYPS, HX OF 10/03/2008  . Hyperlipidemia 08/02/2007  . Tobacco abuse 02/06/2007  . Essential hypertension 02/06/2007    Current Outpatient Medications on File Prior to Visit  Medication Sig Dispense Refill  . hydrALAZINE (APRESOLINE) 50 MG tablet Take 2 tablets (100 mg total) by mouth 3 (three) times daily. 360 tablet 0  . traMADol (ULTRAM) 50 MG tablet TAKE 1 TABLET BY MOUTH EVERY 12 HOURS AS NEEDED 60 tablet 0   No current facility-administered medications on file prior to visit.     Past Medical History:  Diagnosis Date  . Anxiety   . Cataract    beginnings   . Chronic kidney disease 04/23/15   ARF- "resolved"  . Colon polyp   . COPD (chronic obstructive pulmonary disease) (H[facility])    per pt from smoking   . Dehydration 04/2015   Hospitalized for 4 days after abnormal labs found during preop appointment.  . Depression   . Diabetes mellitus without complication ([place])    [id number] 04/24/15- diet controlled   . Diverticulosis   .  Hyperlipidemia   . Hypertension   . Tobacco abuse     Past Surgical History:  Procedure Laterality Date  . COLONOSCOPY    . colonoscopy with polypectomy     polyp X 1  . 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 MAXIMUM ACCESS (MAS)POSTERIOR LUMBAR INTERBODY FUSION (PLIF) 2 LEVEL N/A 05/14/2015   Procedure: Lumbar three-lumbar four,  umbar four-lumbar five Maximum access posterior lumbar interbody fusion;  Surgeon: Eustace Moore, MD;  Location: [facility];  Service: Neurosurgery;  Laterality: N/A;  L3-4 L4-5 Maximum access posterior lumbar interbody fusion  . POLYPECTOMY    . WISDOM TOOTH EXTRACTION      Social History    Socioeconomic History  . Marital status: Married    Spouse name: Not on file  . Number of children: Not on file  . Years of education: Not on file  . Highest education level: Not on file  Occupational History  . Not on file  Social Needs  . Financial resource strain: Not on file  . Food insecurity    Worry: Not on file    Inability: Not on file  . Transportation needs    Medical: Not on file    Non-medical: Not on file  Tobacco Use  . Smoking status: Current Every Day Smoker    Packs/day: 1.50    Years: 40.00    Pack years: 60.00    Types: Cigarettes    Last attempt to quit: 04/19/2015    Years since quitting: 4.2  . Smokeless tobacco: Never Used  Substance and Sexual Activity  . Alcohol use: Yes    Alcohol/week: 3.0 standard drinks    Types: 3 Cans of beer per week    Comment: daily drinks- 12 pack on the weekends case/weekend,   . Drug use: No  . Sexual activity: Not on file  Lifestyle  . Physical activity    Days per week: Not on file    Minutes per session: Not on file  . Stress: Not on file  Relationships  . Social Herbalist on phone: Not on file    Gets together: Not on file    Attends religious service: Not on file    Active member of club or organization: Not on file    Attends meetings of clubs or organizations: Not on file    Relationship status: Not on file  Other Topics Concern  . Not on file  Social History Narrative  . Not on file    Family History  Problem Relation Age of Onset  . Stroke Father   . Melanoma Father   . Stomach cancer Father   . Alcohol abuse Father   . Diabetes Mother   . Diabetes Brother   . Breast cancer Sister   . Colon cancer Neg Hx   . Esophageal cancer Neg Hx   . Rectal cancer Neg Hx   . Colon polyps Neg Hx     Review of Systems  Constitutional: Negative for chills and fever.  Respiratory: Positive for cough (from smoking) and wheezing (occ). Negative for shortness of breath.   Cardiovascular:  Negative for chest pain, palpitations and leg swelling.  Gastrointestinal:       Occ gerd  Neurological: Negative for light-headedness, numbness and headaches.       Objective:   Vitals:   07/16/19 0941 07/16/19 1025  BP: (!) 190/88 (!) 154/86  Pulse: 68  Resp: 16   Temp: 98.1 F (36.7 C)   SpO2: 98%    BP Readings from Last 3 Encounters:  07/16/19 (!) 154/86  01/15/19 (!) 174/92  07/10/18 (!) 152/84   Wt Readings from Last 3 Encounters:  07/16/19 176 lb (79.8 kg)  01/15/19 179 lb (81.2 kg)  07/10/18 175 lb (79.4 kg)   Body mass index is 23.87 kg/m.   Physical Exam    Constitutional: Appears well-developed and well-nourished. No distress.  HENT:  Head: Normocephalic and atraumatic.  Neck: Neck supple. No tracheal deviation present. No thyromegaly present.  No cervical lymphadenopathy Cardiovascular: Normal rate, regular rhythm and normal heart sounds.   No murmur heard. No carotid bruit .  No edema in ankles. Mild swelling in distal left foot - no erythema or tenderness Pulmonary/Chest: Effort normal and breath sounds normal. No respiratory distress. No has no wheezes. No rales.  Msk:  Left fourth toe slightly swollen, decreased ROM, no tenderness Skin: Skin is warm and dry. Not diaphoretic.  Psychiatric: Normal mood and affect. Behavior is normal.      Assessment & Plan:    See Problem List for Assessment and Plan of chronic medical problems.    This visit occurred during the SARS-CoV-2 public health emergency.  Safety protocols were in place, including screening questions prior to the visit, additional usage of staff PPE, and extensive cleaning of exam room while observing appropriate contact time as indicated for disinfecting solutions.

## 2019-07-15 NOTE — Patient Instructions (Addendum)
  Tests ordered today. Your results will be released to MyChart (or called to you) after review.  If any changes need to be made, you will be notified at that same time.   Medications reviewed and updated.  Changes include :   none  Your prescription(s) have been submitted to your pharmacy. Please take as directed and contact our office if you believe you are having problem(s) with the medication(s).    Please followup in 6 months   

## 2019-07-16 ENCOUNTER — Other Ambulatory Visit: Payer: Self-pay

## 2019-07-16 ENCOUNTER — Encounter: Payer: Self-pay | Admitting: Internal Medicine

## 2019-07-16 ENCOUNTER — Other Ambulatory Visit (INDEPENDENT_AMBULATORY_CARE_PROVIDER_SITE_OTHER): Payer: 59

## 2019-07-16 ENCOUNTER — Ambulatory Visit (INDEPENDENT_AMBULATORY_CARE_PROVIDER_SITE_OTHER): Payer: 59 | Admitting: Internal Medicine

## 2019-07-16 VITALS — BP 154/86 | HR 68 | Temp 98.1°F | Resp 16 | Ht 72.0 in | Wt 176.0 lb

## 2019-07-16 DIAGNOSIS — F1010 Alcohol abuse, uncomplicated: Secondary | ICD-10-CM

## 2019-07-16 DIAGNOSIS — E7849 Other hyperlipidemia: Secondary | ICD-10-CM

## 2019-07-16 DIAGNOSIS — E1122 Type 2 diabetes mellitus with diabetic chronic kidney disease: Secondary | ICD-10-CM | POA: Diagnosis not present

## 2019-07-16 DIAGNOSIS — Z720 Tobacco use: Secondary | ICD-10-CM

## 2019-07-16 DIAGNOSIS — I10 Essential (primary) hypertension: Secondary | ICD-10-CM

## 2019-07-16 DIAGNOSIS — F3289 Other specified depressive episodes: Secondary | ICD-10-CM | POA: Diagnosis not present

## 2019-07-16 DIAGNOSIS — M5416 Radiculopathy, lumbar region: Secondary | ICD-10-CM

## 2019-07-16 LAB — CBC WITH DIFFERENTIAL/PLATELET
Basophils Absolute: 0 10*3/uL (ref 0.0–0.1)
Basophils Relative: 0.3 % (ref 0.0–3.0)
Eosinophils Absolute: 0.2 10*3/uL (ref 0.0–0.7)
Eosinophils Relative: 2.1 % (ref 0.0–5.0)
HCT: 49.1 % (ref 39.0–52.0)
Hemoglobin: 16.5 g/dL (ref 13.0–17.0)
Lymphocytes Relative: 22.7 % (ref 12.0–46.0)
Lymphs Abs: 2 10*3/uL (ref 0.7–4.0)
MCHC: 33.6 g/dL (ref 30.0–36.0)
MCV: 97 fl (ref 78.0–100.0)
Monocytes Absolute: 0.9 10*3/uL (ref 0.1–1.0)
Monocytes Relative: 10.2 % (ref 3.0–12.0)
Neutro Abs: 5.6 10*3/uL (ref 1.4–7.7)
Neutrophils Relative %: 64.7 % (ref 43.0–77.0)
Platelets: 202 10*3/uL (ref 150.0–400.0)
RBC: 5.06 Mil/uL (ref 4.22–5.81)
RDW: 14.2 % (ref 11.5–15.5)
WBC: 8.7 10*3/uL (ref 4.0–10.5)

## 2019-07-16 LAB — LIPID PANEL
Cholesterol: 137 mg/dL (ref 0–200)
HDL: 41.3 mg/dL (ref >39.00)
LDL Cholesterol: 78 mg/dL (ref 0–99)
NonHDL: 96.19
Total CHOL/HDL Ratio: 3
Triglycerides: 89 mg/dL (ref 0.0–149.0)
VLDL: 17.8 mg/dL (ref 0.0–40.0)

## 2019-07-16 LAB — COMPREHENSIVE METABOLIC PANEL
ALT: 14 U/L (ref 0–53)
AST: 16 U/L (ref 0–37)
Albumin: 4.3 g/dL (ref 3.5–5.2)
Alkaline Phosphatase: 65 U/L (ref 39–117)
BUN: 13 mg/dL (ref 6–23)
CO2: 29 mEq/L (ref 19–32)
Calcium: 9.4 mg/dL (ref 8.4–10.5)
Chloride: 103 mEq/L (ref 96–112)
Creatinine, Ser: 1.03 mg/dL (ref 0.40–1.50)
GFR: 72.36 mL/min (ref >60.00)
Glucose, Bld: 129 mg/dL — ABNORMAL HIGH (ref 70–99)
Potassium: 4.4 mEq/L (ref 3.5–5.1)
Sodium: 139 mEq/L (ref 135–145)
Total Bilirubin: 0.5 mg/dL (ref 0.2–1.2)
Total Protein: 7.1 g/dL (ref 6.0–8.3)

## 2019-07-16 LAB — HEMOGLOBIN A1C: Hgb A1c MFr Bld: 6.1 % (ref 4.6–6.5)

## 2019-07-16 MED ORDER — METOPROLOL TARTRATE 25 MG PO TABS: 25.0000 mg | ORAL_TABLET | Freq: Two times a day (BID) | ORAL | 1 refills | Status: DC

## 2019-07-16 MED ORDER — CLONIDINE HCL 0.2 MG PO TABS: 0.2000 mg | ORAL_TABLET | Freq: Two times a day (BID) | ORAL | 1 refills | Status: DC

## 2019-07-16 MED ORDER — LOSARTAN POTASSIUM-HCTZ 100-25 MG PO TABS: 1.0000 | ORAL_TABLET | Freq: Every day | ORAL | 1 refills | Status: DC

## 2019-07-16 MED ORDER — ATORVASTATIN CALCIUM 20 MG PO TABS: 20.0000 mg | ORAL_TABLET | Freq: Every day | ORAL | 1 refills | Status: DC

## 2019-07-16 NOTE — Assessment & Plan Note (Signed)
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

## 2019-07-16 NOTE — Assessment & Plan Note (Signed)
Check lipid panel  Continue daily statin Regular exercise and healthy diet encouraged  

## 2019-07-16 NOTE — Assessment & Plan Note (Signed)
Very uncontrolled-he did not take his medication for the past few days Stressed to him that he needs to take his medication on a regular basis Discussed consequences on some of his organs by not taking his medication He will take his medication today and take it on a daily basis CMP

## 2019-07-16 NOTE — Assessment & Plan Note (Signed)
He stopped taking the fluoxetine and does not feel that he needs the medication Denies depression at this time

## 2019-07-16 NOTE — Assessment & Plan Note (Signed)
Chronic pain Taking tramadol twice a day as needed only Medication helps him his pain Discussed that he cannot take the medication if he has alcohol and he states that he will not and has not done that Continue current dose-because of his alcohol use I will not increase the medication

## 2019-07-16 NOTE — Assessment & Plan Note (Signed)
He smokes a pack and a half a day He wants to quit and knows he should quit, but is not motivated to quit He had side effects from Chantix in the past and does not want to retry that

## 2019-07-16 NOTE — Assessment & Plan Note (Signed)
Diet controlled Encouraged low sugar/carbohydrate diet Encourage more regular exercise Check A1c

## 2019-07-18 ENCOUNTER — Telehealth: Payer: Self-pay

## 2019-07-18 ENCOUNTER — Ambulatory Visit: Payer: 59 | Admitting: Internal Medicine

## 2019-07-18 ENCOUNTER — Other Ambulatory Visit: Payer: Self-pay | Admitting: Internal Medicine

## 2019-07-18 NOTE — Telephone Encounter (Signed)
Copied from [place] [phone]. Topic: General - Inquiry >> Jul 18, 2019  9:26 AM Virl Axe D wrote: Reason for CRM: Pt is requesting a copy of recent lab results to be mailed to his home. Please advise.

## 2019-07-18 NOTE — Telephone Encounter (Signed)
Results mailed 

## 2019-08-08 ENCOUNTER — Other Ambulatory Visit: Payer: Self-pay | Admitting: Internal Medicine

## 2019-08-14 ENCOUNTER — Other Ambulatory Visit: Payer: Self-pay

## 2019-08-14 ENCOUNTER — Ambulatory Visit (INDEPENDENT_AMBULATORY_CARE_PROVIDER_SITE_OTHER): Payer: 59 | Admitting: Family Medicine

## 2019-08-14 ENCOUNTER — Encounter: Payer: Self-pay | Admitting: Family Medicine

## 2019-08-14 ENCOUNTER — Ambulatory Visit (INDEPENDENT_AMBULATORY_CARE_PROVIDER_SITE_OTHER): Payer: 59

## 2019-08-14 VITALS — BP 162/108 | HR 76 | Ht 72.0 in | Wt 171.0 lb

## 2019-08-14 DIAGNOSIS — M25532 Pain in left wrist: Secondary | ICD-10-CM

## 2019-08-14 DIAGNOSIS — G5602 Carpal tunnel syndrome, left upper limb: Secondary | ICD-10-CM

## 2019-08-14 NOTE — Progress Notes (Signed)
[place] Phone: [phone] Subjective:   Fontaine No, am serving as a scribe for Dr. Hulan Saas. This visit occurred during the SARS-CoV-2 public health emergency.  Safety protocols were in place, including screening questions prior to the visit, additional usage of staff PPE, and extensive cleaning of exam room while observing appropriate contact time as indicated for disinfecting solutions.    CC: Wrist pain follow-up, mild back pain  [id number]   11/07/2017 Repeat injection given today.  Discussed icing regimen and home exercises.  Discussed bracing and topical anti-inflammatories.  Patient will follow up with me again in 8 weeks  Update 08/14/2019 Keith [name] is a 64 y.o. male coming in with complaint of left wrist pain and back pain. Patient states that his back pain increased over the weekend. Patient was unable to get out of bed yesterday. Took 2 Tramadol yesterday. Right sided pain. History of back surgery in 2017.  Patient has a follow-up with his neurosurgeon this week.  Left wrist continues to bother him. History of TFCC injury. Is having tingling in thumb and 2nd and 3rd fingers that is constant.  Patient states that more the tingling and the pain this time.  Seems to be different.     Past Medical History:  Diagnosis Date  . Anxiety   . Cataract    beginnings   . Chronic kidney disease 04/23/15   ARF- "resolved"  . Colon polyp   . COPD (chronic obstructive pulmonary disease) (HCC)    per pt from smoking   . Dehydration 04/2015   Hospitalized for 4 days after abnormal labs found during preop appointment.  . Depression   . Diabetes mellitus without complication ([place])    123XX123 04/24/15- diet controlled   . Diverticulosis   . Hyperlipidemia   . Hypertension   . Tobacco abuse    Past Surgical History:  Procedure Laterality Date  . COLONOSCOPY    . colonoscopy with polypectomy     polyp X 1  . 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 MAXIMUM ACCESS (MAS)POSTERIOR LUMBAR INTERBODY FUSION (PLIF) 2 LEVEL N/A 05/14/2015   Procedure: Lumbar three-lumbar four,  umbar four-lumbar five Maximum access posterior lumbar interbody fusion;  Surgeon: Eustace Moore, MD;  Location: [facility];  Service: Neurosurgery;  Laterality: N/A;  L3-4 L4-5 Maximum access posterior lumbar interbody fusion  . POLYPECTOMY    . WISDOM TOOTH EXTRACTION     Social History   Socioeconomic History  . Marital status: Married    Spouse name: Not on file  . Number of children: Not on file  . Years of education: Not on file  . Highest education level: Not on file  Occupational History  . Not on file  Tobacco Use  . Smoking status: Current Every Day Smoker    Packs/day: 1.50    Years: 40.00    Pack years: 60.00    Types: Cigarettes    Last attempt to quit: 04/19/2015    Years since quitting: 4.3  . Smokeless tobacco: Never Used  Substance and Sexual Activity  . Alcohol use: Yes    Alcohol/week: 3.0 standard drinks    Types: 3 Cans of beer per week    Comment: daily drinks- 12 pack on the weekends case/weekend,   . Drug use: No  . Sexual activity: Not on file  Other Topics Concern  .  Not on file  Social History Narrative  . Not on file   Social Determinants of Health   Financial Resource Strain:   . Difficulty of Paying Living Expenses: Not on file  Food Insecurity:   . Worried About Charity fundraiser in the Last Year: Not on file  . Ran Out of Food in the Last Year: Not on file  Transportation Needs:   . Lack of Transportation (Medical): Not on file  . Lack of Transportation (Non-Medical): Not on file  Physical Activity:   . Days of Exercise per Week: Not on file  . Minutes of Exercise per Session: Not on file  Stress:   . Feeling of Stress : Not on file  Social Connections:   . Frequency of Communication with Friends and Family: Not on file  . Frequency  of Social Gatherings with Friends and Family: Not on file  . Attends Religious Services: Not on file  . Active Member of Clubs or Organizations: Not on file  . Attends Archivist Meetings: Not on file  . Marital Status: Not on file   Allergies  Allergen Reactions  . Bupropion Hcl     REACTION: rash @ pressure areas (waist, groin , axillae)  . Penicillins     ? Rash , hives  . Amoxicillin Itching and Rash   Family History  Problem Relation Age of Onset  . Stroke Father   . Melanoma Father   . Stomach cancer Father   . Alcohol abuse Father   . Diabetes Mother   . Diabetes Brother   . Breast cancer Sister   . Colon cancer Neg Hx   . Esophageal cancer Neg Hx   . Rectal cancer Neg Hx   . Colon polyps Neg Hx      Current Outpatient Medications (Cardiovascular):  .  atorvastatin (LIPITOR) 20 MG tablet, Take 1 tablet (20 mg total) by mouth daily. .  cloNIDine (CATAPRES) 0.2 MG tablet, Take 1 tablet (0.2 mg total) by mouth 2 (two) times daily. .  hydrALAZINE (APRESOLINE) 50 MG tablet, Take 2 tablets by mouth twice daily .  losartan-hydrochlorothiazide (HYZAAR) 100-25 MG tablet, Take 1 tablet by mouth daily. .  metoprolol tartrate (LOPRESSOR) 25 MG tablet, Take 1 tablet (25 mg total) by mouth 2 (two) times daily.   Current Outpatient Medications (Analgesics):  .  traMADol (ULTRAM) 50 MG tablet, TAKE 1 TABLET BY MOUTH EVERY 12 HOURS AS NEEDED      Past medical history, social, surgical and family history all reviewed in electronic medical record.  No pertanent information unless stated regarding to the chief complaint.   Review of Systems:  No headache, visual changes, nausea, vomiting, diarrhea, constipation, dizziness, abdominal pain, skin rash, fevers, chills, night sweats, weight loss, swollen lymph nodes, body aches, joint swelling,  chest pain, shortness of breath, mood changes.  Positive muscle aches  Objective  Blood pressure (!) 162/108, pulse 76, height 6'  (1.829 m), weight 171 lb (77.6 kg), SpO2 97 %.    General: No apparent distress alert and oriented x3 mood and affect normal, dressed appropriately.  HEENT: Pupils equal, extraocular movements intact  Respiratory: Patient's speak in full sentences and does not appear short of breath  Cardiovascular: No lower extremity edema, non tender, no erythema  Skin: Warm dry intact with no signs of infection or rash on extremities or on axial skeleton.  Abdomen: Soft nontender  Neuro: Cranial nerves II through XII are intact, neurovascularly intact in all  extremities with 2+ DTRs and 2+ pulses.  Lymph: No lymphadenopathy of posterior or anterior cervical chain or axillae bilaterally.  Gait normal with good balance and coordination.  MSK:  tender with full range of motion and good stability and symmetric strength and tone of shoulders, elbows,  hip, knee and ankles bilaterally.  Left wrist exam shows the patient does have a positive Tinel's sign.  Patient does have mild weakness in grip strength but significantly symmetric to the contralateral side.  Very mild thenar eminence wasting.  TFCC chronically does have some inflammation   Limited musculoskeletal ultrasound was performed interpreted by Lyndal Pulley  Limited ultrasound of patient's left wrist shows the patient does have dilation and hypoechoic changes in the median nerve noted on the left side.  TFCC does have some chronic changes as well noted still but no significant hypoechoic changes or increasing Doppler flow at the moment.  Otherwise fairly unremarkable. Impression: Carpal tunnel syndrome with TFCC tear chronic   Impression and Recommendations:     This case required medical decision making of moderate complexity. The above documentation has been reviewed and is accurate and complete Lyndal Pulley, DO       Note: This dictation was prepared with Dragon dictation along with smaller phrase technology. Any transcriptional errors that  result from this process are unintentional.

## 2019-08-14 NOTE — Patient Instructions (Signed)
Brace night and day for 1 weeks then at night for 2 weeks Exercises 3x a week Can inject if doesn't improve Pennsaid 2x a day as needed See me again in 4 weeks

## 2019-08-14 NOTE — Assessment & Plan Note (Signed)
Patient has had a history of this previously.  Seems to be left-sided.  Patient including bracing, patient wants to avoid the gabapentin at the moment due to medications.  Tried topical anti-inflammatories, work with Product/process development scientist to learn home exercises, follow-up again in 4 to 8 weeks

## 2019-08-15 ENCOUNTER — Ambulatory Visit: Payer: 59 | Admitting: Internal Medicine

## 2019-09-10 ENCOUNTER — Other Ambulatory Visit: Payer: Self-pay | Admitting: Internal Medicine

## 2019-09-14 ENCOUNTER — Other Ambulatory Visit: Payer: Self-pay | Admitting: Internal Medicine

## 2019-09-14 MED ORDER — TRAMADOL HCL 50 MG PO TABS: 50.0000 mg | ORAL_TABLET | Freq: Two times a day (BID) | ORAL | 0 refills | Status: DC | PRN

## 2019-09-14 NOTE — Telephone Encounter (Signed)
       1. Which medications need to be refilled? (please list name of each medication and dose if known) traMADol (ULTRAM) 50 MG tablet  2. Which pharmacy/location (including street and city if local pharmacy) is medication to be sent to?[facility] ([place] [place] - [address]  3. Do they need a 30 day or 90 day supply? [place]

## 2019-09-14 NOTE — Telephone Encounter (Signed)
Last refill was 08/08/19 Last OV 07/16/19 Next OV 01/14/20

## 2019-09-17 ENCOUNTER — Other Ambulatory Visit: Payer: Self-pay

## 2019-09-17 ENCOUNTER — Encounter: Payer: Self-pay | Admitting: Family Medicine

## 2019-09-17 ENCOUNTER — Ambulatory Visit (INDEPENDENT_AMBULATORY_CARE_PROVIDER_SITE_OTHER): Payer: 59 | Admitting: Family Medicine

## 2019-09-17 ENCOUNTER — Ambulatory Visit (INDEPENDENT_AMBULATORY_CARE_PROVIDER_SITE_OTHER): Payer: 59

## 2019-09-17 VITALS — BP 124/82 | HR 69 | Ht 72.0 in | Wt 173.0 lb

## 2019-09-17 DIAGNOSIS — M25532 Pain in left wrist: Secondary | ICD-10-CM | POA: Diagnosis not present

## 2019-09-17 DIAGNOSIS — G5602 Carpal tunnel syndrome, left upper limb: Secondary | ICD-10-CM

## 2019-09-17 MED ORDER — GABAPENTIN 100 MG PO CAPS: 200.0000 mg | ORAL_CAPSULE | Freq: Every day | ORAL | 0 refills | Status: DC

## 2019-09-17 NOTE — Assessment & Plan Note (Addendum)
Chronic problem with exacerbation carpal tunnel.  Discussed the possibility of injection, patient declined at the moment.  Discussed icing regimen and home exercise, patient will increase activity slowly.  Follow-up with me again in 2 to 3 weeks and will consider injection after patient's surgical intervention.  Social determinants of health include patient having the surgery and would like to wait approximately 2 weeks until patient does have the steroid injection.  Patient is in agreement with the plan and will try to do more bracing and home exercises

## 2019-09-17 NOTE — Progress Notes (Signed)
Keith [name] [place] Phone: [phone] Subjective:   Keith [name], am serving as a scribe for Dr. Hulan Saas. This visit occurred during the SARS-CoV-2 public health emergency.  Safety protocols were in place, including screening questions prior to the visit, additional usage of staff PPE, and extensive cleaning of exam room while observing appropriate contact time as indicated for disinfecting solutions.   I'm seeing this patient by the request  of:  Binnie Rail, MD  CC: Left wrist pain follow-up  [id number]   08/14/2019 Patient has had a history of this previously.  Seems to be left-sided.  Patient including bracing, patient wants to avoid the gabapentin at the moment due to medications.  Tried topical anti-inflammatories, work with Product/process development scientist to learn home exercises, follow-up again in 4 to 8 weeks Update 09/17/2019 Keith [name] is a 64 y.o. male coming in with complaint of left wrist pain. Patient states that he was not compliant with the exercises or medications. Does feel like he is improving with time though. Complains of tingling in the thumb, 2nd and 3rd fingers.     Found to have moderate to severe arthritic changes of the left wrist as well as carpal tunnel syndrome and chronic TFCC tear  Past Medical History:  Diagnosis Date  . Anxiety   . Cataract    beginnings   . Chronic kidney disease 04/23/15   ARF- "resolved"  . Colon polyp   . COPD (chronic obstructive pulmonary disease) (HCC)    per pt from smoking   . Dehydration 04/2015   Hospitalized for 4 days after abnormal labs found during preop appointment.  . Depression   . Diabetes mellitus without complication ([place])    123XX123 04/24/15- diet controlled   . Diverticulosis   . Hyperlipidemia   . Hypertension   . Tobacco abuse    Past Surgical History:  Procedure Laterality Date  . COLONOSCOPY    . colonoscopy with polypectomy     polyp X 1   . 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 MAXIMUM ACCESS (MAS)POSTERIOR LUMBAR INTERBODY FUSION (PLIF) 2 LEVEL N/A 05/14/2015   Procedure: Lumbar three-lumbar four,  umbar four-lumbar five Maximum access posterior lumbar interbody fusion;  Surgeon: Keith Moore, MD;  Location: [facility];  Service: Neurosurgery;  Laterality: N/A;  L3-4 L4-5 Maximum access posterior lumbar interbody fusion  . POLYPECTOMY    . WISDOM TOOTH EXTRACTION     Social History   Socioeconomic History  . Marital status: Married    Spouse name: Not on file  . Number of children: Not on file  . Years of education: Not on file  . Highest education level: Not on file  Occupational History  . Not on file  Tobacco Use  . Smoking status: Current Every Day Smoker    Packs/day: 1.50    Years: 40.00    Pack years: 60.00    Types: Cigarettes    Last attempt to quit: 04/19/2015    Years since quitting: 4.4  . Smokeless tobacco: Never Used  Substance and Sexual Activity  . Alcohol use: Yes    Alcohol/week: 3.0 standard drinks    Types: 3 Cans of beer per week    Comment: daily drinks- 12 pack on the weekends case/weekend,   . Drug use: [name]  . Sexual activity: Not on file  Other Topics Concern  .  Not on file  Social History Narrative  . Not on file   Social Determinants of Health   Financial Resource Strain:   . Difficulty of Paying Living Expenses: Not on file  Food Insecurity:   . Worried About Charity fundraiser in the Last Year: Not on file  . Ran Out of Food in the Last Year: Not on file  Transportation Needs:   . Lack of Transportation (Medical): Not on file  . Lack of Transportation (Non-Medical): Not on file  Physical Activity:   . Days of Exercise per Week: Not on file  . Minutes of Exercise per Session: Not on file  Stress:   . Feeling of Stress : Not on file  Social Connections:   . Frequency of Communication with Friends and Family: Not on file  . Frequency of Social  Gatherings with Friends and Family: Not on file  . Attends Religious Services: Not on file  . Active Member of Clubs or Organizations: Not on file  . Attends Archivist Meetings: Not on file  . Marital Status: Not on file   Allergies  Allergen Reactions  . Bupropion Hcl     REACTION: rash @ pressure areas (waist, groin , axillae)  . Penicillins     ? Rash , hives  . Amoxicillin Itching and Rash   Family History  Problem Relation Age of Onset  . Stroke Father   . Melanoma Father   . Stomach cancer Father   . Alcohol abuse Father   . Diabetes Mother   . Diabetes Brother   . Breast cancer Sister   . Colon cancer Neg Hx   . Esophageal cancer Neg Hx   . Rectal cancer Neg Hx   . Colon polyps Neg Hx      Current Outpatient Medications (Cardiovascular):  .  atorvastatin (LIPITOR) 20 MG tablet, Take 1 tablet (20 mg total) by mouth daily. .  cloNIDine (CATAPRES) 0.2 MG tablet, Take 1 tablet (0.2 mg total) by mouth 2 (two) times daily. .  hydrALAZINE (APRESOLINE) 50 MG tablet, Take 2 tablets by mouth twice daily .  losartan-hydrochlorothiazide (HYZAAR) 100-25 MG tablet, Take 1 tablet by mouth daily. .  metoprolol tartrate (LOPRESSOR) 25 MG tablet, Take 1 tablet (25 mg total) by mouth 2 (two) times daily.   Current Outpatient Medications (Analgesics):  .  traMADol (ULTRAM) 50 MG tablet, Take 1 tablet (50 mg total) by mouth every 12 (twelve) hours as needed.   Current Outpatient Medications (Other):  .  gabapentin (NEURONTIN) 100 MG capsule, Take 2 capsules (200 mg total) by mouth at bedtime.   Reviewed prior external information including notes and imaging from  primary care provider As well as notes that were available from care everywhere and other healthcare systems.  Past medical history, social, surgical and family history all reviewed in electronic medical record.  [name] pertanent information unless stated regarding to the chief complaint.   Review of Systems:   [name] headache, visual changes, nausea, vomiting, diarrhea, constipation, dizziness, abdominal pain, skin rash, fevers, chills, night sweats, weight loss, swollen lymph nodes, body aches,  chest pain, shortness of breath, mood changes. POSITIVE muscle aches, joint swelling   Objective  Blood pressure 124/82, pulse 69, height 6' (1.829 m), weight 173 lb (78.5 kg), SpO2 98 %.   General: [name] apparent distress alert and oriented x3 mood and affect normal, dressed appropriately.  HEENT: Pupils equal, extraocular movements intact  Respiratory: Patient's speak in full sentences and  does not appear short of breath  Cardiovascular: [name] lower extremity edema, non tender, [name] erythema  Skin: Warm dry intact with [name] signs of infection or rash on extremities or on axial skeleton.  Abdomen: Soft nontender  Neuro: Cranial nerves II through XII are intact, neurovascularly intact in all extremities with 2+ DTRs and 2+ pulses.  Lymph: [name] lymphadenopathy of posterior or anterior cervical chain or axillae bilaterally.  Gait normal with good balance and coordination.  MSK:  Non tender with full range of motion and good stability and symmetric strength and tone of shoulders, elbows,  hip, knee and ankles bilaterally.  Wrist:left wrist  Inspection normal with [name] visible erythema or swelling.  Very mild atrophy of the hypothenar eminence noted. ROM smooth and normal with good flexion and extension and ulnar/radial deviation that is symmetrical with opposite wrist. Palpation is minorly worsening over the TFCC [name] snuffbox tenderness. [name] tenderness over [place]. Strength 5/5 in all directions without pain. Negative Finkelstein, positive tinel's and phalens. Negative Watson's test. Contralateral wrist unremarkable  Limited musculoskeletal ultrasound was performed and interpreted by Lyndal Pulley  Limited ultrasound of patient's median nerve shows an area of 0.10 cm.  Mild increase in hypoechoic changes.  Mild  distortion under the right axilla Impression: Consistent with moderate carpal tunnel     Impression and Recommendations:     This case required medical decision making of moderate complexity. The above documentation has been reviewed and is accurate and complete Lyndal Pulley, DO       Note: This dictation was prepared with Dragon dictation along with smaller phrase technology. Any transcriptional errors that result from this process are unintentional.

## 2019-09-17 NOTE — Patient Instructions (Signed)
Gabapentin 200mg  at night Wear brace during the day Do exercises Continue injection in 2-4 weeks

## 2019-10-15 ENCOUNTER — Ambulatory Visit: Payer: 59 | Admitting: Family Medicine

## 2019-10-22 ENCOUNTER — Other Ambulatory Visit: Payer: Self-pay | Admitting: Internal Medicine

## 2019-10-22 NOTE — Telephone Encounter (Signed)
Last RF 09/14/19 Last OV 07/16/19 Next OV 01/14/20

## 2019-11-28 ENCOUNTER — Other Ambulatory Visit: Payer: Self-pay | Admitting: Internal Medicine

## 2019-11-29 NOTE — Telephone Encounter (Signed)
Last OV 07/18/19 Next OV 01/14/20 Last RF 10/23/19

## 2020-01-03 ENCOUNTER — Other Ambulatory Visit: Payer: Self-pay | Admitting: Internal Medicine

## 2020-01-03 NOTE — Telephone Encounter (Signed)
Last OV 07/16/19 Next OV 01/14/20 Last RF 11/29/19

## 2020-01-12 NOTE — Progress Notes (Signed)
Subjective:    Patient ID: Keith [name], male    DOB: [birth year], 64 y.o.   MRN: [id number]  HPI The patient is here for follow up of their chronic medical problems, including chronic lower back pain from lumbar radiculopathy, htn, DM, high chol, smoking, depression.  He is taking all of his medications as prescribed.   He is not exercising regularly.   He does not eat healthy.   He has been having neck pain x few weeks.  He continues to have constant chronic lower back pain.  He denies radiating pain from his neck to his arms or N/T that is new from the neck.  He has N/T from L CTS.    His feet at the end of the day burn - like they are on fire.   He is having constipation.    Medications and allergies reviewed with patient and updated if appropriate.  Patient Active Problem List   Diagnosis Date Noted  . Burning pain 01/14/2020  . TFCC (triangular fibrocartilage complex) injury, left, initial encounter 09/05/2017  . Polyarthropathy 09/05/2017  . Arthritis of left wrist 08/05/2017  . Left wrist pain 07/08/2017  . Depression 11/09/2015  . S/P lumbar spinal fusion 05/14/2015  . Alcohol abuse 04/23/2015  . DM (diabetes mellitus) type II controlled with renal manifestation ([place]) 04/23/2015  . Lumbar radiculopathy 01/23/2015  . CARPAL TUNNEL SYNDROME 12/22/2009  . HYPERPLASIA PROSTATE UNS W/O UR OBST & OTH LUTS 12/22/2009  . DIVERTICULOSIS, COLON 10/03/2008  . COLONIC POLYPS, HX OF 10/03/2008  . Hyperlipidemia 08/02/2007  . Tobacco abuse 02/06/2007  . Essential hypertension 02/06/2007    Current Outpatient Medications on File Prior to Visit  Medication Sig Dispense Refill  . atorvastatin (LIPITOR) 20 MG tablet Take 1 tablet (20 mg total) by mouth daily. 90 tablet 1  . cloNIDine (CATAPRES) 0.2 MG tablet Take 1 tablet (0.2 mg total) by mouth 2 (two) times daily. 180 tablet 1  . hydrALAZINE (APRESOLINE) 50 MG tablet Take 2 tablets by mouth twice daily 360 tablet 1  .  losartan-hydrochlorothiazide (HYZAAR) 100-25 MG tablet Take 1 tablet by mouth daily. 90 tablet 1  . metoprolol tartrate (LOPRESSOR) 25 MG tablet Take 1 tablet (25 mg total) by mouth 2 (two) times daily. 180 tablet 1  . traMADol (ULTRAM) 50 MG tablet TAKE 1 TABLET BY MOUTH EVERY 12 HOURS AS NEEDED 60 tablet 0   No current facility-administered medications on file prior to visit.    Past Medical History:  Diagnosis Date  . Anxiety   . Cataract    beginnings   . Chronic kidney disease 04/23/15   ARF- "resolved"  . Colon polyp   . COPD (chronic obstructive pulmonary disease) (HCC)    per pt from smoking   . Dehydration 04/2015   Hospitalized for 4 days after abnormal labs found during preop appointment.  . Depression   . Diabetes mellitus without complication ([place])    H7W 04/24/15- diet controlled   . Diverticulosis   . Hyperlipidemia   . Hypertension   . Tobacco abuse     Past Surgical History:  Procedure Laterality Date  . COLONOSCOPY    . colonoscopy with polypectomy     polyp X 1  . 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 MAXIMUM ACCESS (MAS)POSTERIOR LUMBAR INTERBODY FUSION (PLIF) 2 LEVEL N/A 05/14/2015   Procedure: Lumbar three-lumbar four,  umbar four-lumbar five Maximum access posterior lumbar  interbody fusion;  Surgeon: Eustace Moore, MD;  Location: [facility];  Service: Neurosurgery;  Laterality: N/A;  L3-4 L4-5 Maximum access posterior lumbar interbody fusion  . POLYPECTOMY    . WISDOM TOOTH EXTRACTION      Social History   Socioeconomic History  . Marital status: Married    Spouse name: Not on file  . Number of children: Not on file  . Years of education: Not on file  . Highest education level: Not on file  Occupational History  . Not on file  Tobacco Use  . Smoking status: Current Every Day Smoker    Packs/day: 1.50    Years: 40.00    Pack years: 60.00    Types: Cigarettes    Last attempt to quit: 04/19/2015    Years since quitting:  4.7  . Smokeless tobacco: Never Used  Substance and Sexual Activity  . Alcohol use: Yes    Alcohol/week: 3.0 standard drinks    Types: 3 Cans of beer per week    Comment: daily drinks- 12 pack on the weekends case/weekend,   . Drug use: No  . Sexual activity: Not on file  Other Topics Concern  . Not on file  Social History Narrative  . Not on file   Social Determinants of Health   Financial Resource Strain:   . Difficulty of Paying Living Expenses:   Food Insecurity:   . Worried About Charity fundraiser in the Last Year:   . Arboriculturist in the Last Year:   Transportation Needs:   . Film/video editor (Medical):   Marland Kitchen Lack of Transportation (Non-Medical):   Physical Activity:   . Days of Exercise per Week:   . Minutes of Exercise per Session:   Stress:   . Feeling of Stress :   Social Connections:   . Frequency of Communication with Friends and Family:   . Frequency of Social Gatherings with Friends and Family:   . Attends Religious Services:   . Active Member of Clubs or Organizations:   . Attends Archivist Meetings:   Marland Kitchen Marital Status:     Family History  Problem Relation Age of Onset  . Stroke Father   . Melanoma Father   . Stomach cancer Father   . Alcohol abuse Father   . Diabetes Mother   . Diabetes Brother   . Breast cancer Sister   . Colon cancer Neg Hx   . Esophageal cancer Neg Hx   . Rectal cancer Neg Hx   . Colon polyps Neg Hx     Review of Systems  Constitutional: Negative for chills and fever.  Respiratory: Positive for cough (smoking related) and wheezing (occ - smoking). Negative for shortness of breath.   Cardiovascular: Negative for chest pain and palpitations.  Neurological: Positive for numbness. Negative for dizziness, light-headedness and headaches.       Objective:   Vitals:   01/14/20 0931  BP: 140/88  Pulse: 76  Temp: 98.2 F (36.8 C)  SpO2: 97%   BP Readings from Last 3 Encounters:  01/14/20 140/88    09/17/19 124/82  08/14/19 (!) 162/108   Wt Readings from Last 3 Encounters:  01/14/20 172 lb 6.4 oz (78.2 kg)  09/17/19 173 lb (78.5 kg)  08/14/19 171 lb (77.6 kg)   Body mass index is 23.38 kg/m.   Physical Exam    Constitutional: Appears well-developed and well-nourished. No distress.  HENT:  Head: Normocephalic and atraumatic.  Neck: Neck supple. No tracheal deviation present. No thyromegaly present.  No cervical lymphadenopathy Cardiovascular: Normal rate, regular rhythm and normal heart sounds.   No murmur heard. No carotid bruit .  No edema Pulmonary/Chest: Effort normal and breath sounds normal. No respiratory distress. No has no wheezes. No rales.  Skin: Skin is warm and dry. Not diaphoretic.  Psychiatric: Normal mood and affect. Behavior is normal.      Assessment & Plan:    See Problem List for Assessment and Plan of chronic medical problems.    This visit occurred during the SARS-CoV-2 public health emergency.  Safety protocols were in place, including screening questions prior to the visit, additional usage of staff PPE, and extensive cleaning of exam room while observing appropriate contact time as indicated for disinfecting solutions.

## 2020-01-12 NOTE — Patient Instructions (Addendum)
  Blood work was ordered.   ° ° °Medications reviewed and updated.  Changes include :   none ° ° ° °Please followup in 6 months ° ° °

## 2020-01-14 ENCOUNTER — Encounter: Payer: Self-pay | Admitting: Internal Medicine

## 2020-01-14 ENCOUNTER — Ambulatory Visit (INDEPENDENT_AMBULATORY_CARE_PROVIDER_SITE_OTHER): Payer: 59 | Admitting: Internal Medicine

## 2020-01-14 ENCOUNTER — Other Ambulatory Visit: Payer: Self-pay

## 2020-01-14 VITALS — BP 140/88 | HR 76 | Temp 98.2°F | Ht 72.0 in | Wt 172.4 lb

## 2020-01-14 DIAGNOSIS — Z720 Tobacco use: Secondary | ICD-10-CM | POA: Diagnosis not present

## 2020-01-14 DIAGNOSIS — E1122 Type 2 diabetes mellitus with diabetic chronic kidney disease: Secondary | ICD-10-CM | POA: Diagnosis not present

## 2020-01-14 DIAGNOSIS — E7849 Other hyperlipidemia: Secondary | ICD-10-CM

## 2020-01-14 DIAGNOSIS — M5416 Radiculopathy, lumbar region: Secondary | ICD-10-CM

## 2020-01-14 DIAGNOSIS — R52 Pain, unspecified: Secondary | ICD-10-CM | POA: Diagnosis not present

## 2020-01-14 DIAGNOSIS — I10 Essential (primary) hypertension: Secondary | ICD-10-CM

## 2020-01-14 DIAGNOSIS — Z125 Encounter for screening for malignant neoplasm of prostate: Secondary | ICD-10-CM

## 2020-01-14 LAB — COMPREHENSIVE METABOLIC PANEL
ALT: 11 U/L (ref 0–53)
AST: 16 U/L (ref 0–37)
Albumin: 4 g/dL (ref 3.5–5.2)
Alkaline Phosphatase: 63 U/L (ref 39–117)
BUN: 13 mg/dL (ref 6–23)
CO2: 30 mEq/L (ref 19–32)
Calcium: 9.5 mg/dL (ref 8.4–10.5)
Chloride: 102 mEq/L (ref 96–112)
Creatinine, Ser: 0.89 mg/dL (ref 0.40–1.50)
GFR: 85.51 mL/min (ref >60.00)
Glucose, Bld: 116 mg/dL — ABNORMAL HIGH (ref 70–99)
Potassium: 4.2 mEq/L (ref 3.5–5.1)
Sodium: 137 mEq/L (ref 135–145)
Total Bilirubin: 0.4 mg/dL (ref 0.2–1.2)
Total Protein: 7.1 g/dL (ref 6.0–8.3)

## 2020-01-14 LAB — HEMOGLOBIN A1C: Hgb A1c MFr Bld: 6.3 % (ref 4.6–6.5)

## 2020-01-14 LAB — LIPID PANEL
Cholesterol: 168 mg/dL (ref 0–200)
HDL: 36.5 mg/dL — ABNORMAL LOW (ref >39.00)
LDL Cholesterol: 111 mg/dL — ABNORMAL HIGH (ref 0–99)
NonHDL: 131.61
Total CHOL/HDL Ratio: 5
Triglycerides: 103 mg/dL (ref 0.0–149.0)
VLDL: 20.6 mg/dL (ref 0.0–40.0)

## 2020-01-14 LAB — VITAMIN B12: Vitamin B-12: 276 pg/mL (ref 211–911)

## 2020-01-14 NOTE — Assessment & Plan Note (Signed)
Chronic - continues to smoke, although he may be smoking less Not committed to quitting at this time

## 2020-01-14 NOTE — Assessment & Plan Note (Signed)
Chronic lower back pain Taking tramadol BID prn Medication helps He is aware he can not take this when drinking alcohol or driving Will continue

## 2020-01-14 NOTE — Assessment & Plan Note (Signed)
Chronic Check lipid panel  Continue daily statin Regular exercise and healthy diet encouraged  

## 2020-01-14 NOTE — Assessment & Plan Note (Signed)
Chronic BP borderline high here today He admits he often forgets his evening pills -- stressed compliance Current regimen effective when taken and well tolerated Continue current medications at current doses cmp

## 2020-01-14 NOTE — Assessment & Plan Note (Signed)
Acute C/o burning pain in feet at night Possibly related to chronic back pain or alcohol use Unlikely related to DM given good control Advised decreasing alcohol use Can not take gabapentin at night since he drives daily and can not be drowsy in morning Check B12

## 2020-01-14 NOTE — Assessment & Plan Note (Signed)
Chronic Diet controlled Check a1c Low sugar / carb diet    

## 2020-01-15 LAB — PSA, TOTAL AND FREE
PSA, % Free: 50 % (calc) (ref >25)
PSA, Free: 0.1 ng/mL
PSA, Total: 0.2 ng/mL (ref <=4.0)

## 2020-02-07 ENCOUNTER — Other Ambulatory Visit: Payer: Self-pay | Admitting: Internal Medicine

## 2020-03-14 ENCOUNTER — Other Ambulatory Visit: Payer: Self-pay | Admitting: Internal Medicine

## 2020-03-14 NOTE — Telephone Encounter (Signed)
Check [place] registry last filled 02/08/2020.Marland KitchenJohny [name]

## 2020-04-25 ENCOUNTER — Other Ambulatory Visit: Payer: Self-pay | Admitting: Internal Medicine

## 2020-04-25 NOTE — Telephone Encounter (Signed)
   Patient requesting refill today, no medication remaining, back pain

## 2020-04-25 NOTE — Telephone Encounter (Signed)
Patient is calling back about the refill for his traMADol (ULTRAM) 50 MG tablet States that he has no more pills.

## 2020-04-25 NOTE — Telephone Encounter (Signed)
Check [place] registry last filled 03/14/2020.Marland KitchenJohny [name]

## 2020-05-28 ENCOUNTER — Telehealth: Payer: Self-pay | Admitting: Internal Medicine

## 2020-05-28 MED ORDER — ATORVASTATIN CALCIUM 20 MG PO TABS
20.0000 mg | ORAL_TABLET | Freq: Every day | ORAL | 0 refills | Status: DC
Start: 2020-05-28 — End: 2020-11-28

## 2020-05-28 MED ORDER — HYDRALAZINE HCL 50 MG PO TABS: 100.0000 mg | ORAL_TABLET | Freq: Two times a day (BID) | ORAL | 0 refills | Status: DC

## 2020-05-28 MED ORDER — LOSARTAN POTASSIUM-HCTZ 100-25 MG PO TABS
1.0000 | ORAL_TABLET | Freq: Every day | ORAL | 0 refills | Status: DC
Start: 2020-05-28 — End: 2020-12-19

## 2020-05-28 MED ORDER — TRAMADOL HCL 50 MG PO TABS
50.0000 mg | ORAL_TABLET | Freq: Two times a day (BID) | ORAL | 0 refills | Status: DC | PRN
Start: 2020-05-28 — End: 2020-07-02

## 2020-05-28 MED ORDER — CLONIDINE HCL 0.2 MG PO TABS
0.2000 mg | ORAL_TABLET | Freq: Two times a day (BID) | ORAL | 0 refills | Status: DC
Start: 2020-05-28 — End: 2021-09-16

## 2020-05-28 MED ORDER — CLONIDINE HCL 0.2 MG PO TABS: 0.2000 mg | ORAL_TABLET | Freq: Two times a day (BID) | ORAL | 0 refills | Status: DC

## 2020-05-28 MED ORDER — LOSARTAN POTASSIUM-HCTZ 100-25 MG PO TABS
1.0000 | ORAL_TABLET | Freq: Every day | ORAL | 0 refills | Status: DC
Start: 2020-05-28 — End: 2020-05-28

## 2020-05-28 MED ORDER — METOPROLOL TARTRATE 25 MG PO TABS
25.0000 mg | ORAL_TABLET | Freq: Two times a day (BID) | ORAL | 0 refills | Status: DC
Start: 2020-05-28 — End: 2021-09-16

## 2020-05-28 MED ORDER — HYDRALAZINE HCL 50 MG PO TABS
100.0000 mg | ORAL_TABLET | Freq: Two times a day (BID) | ORAL | 0 refills | Status: DC
Start: 2020-05-28 — End: 2021-09-16

## 2020-05-28 MED ORDER — ATORVASTATIN CALCIUM 20 MG PO TABS
20.0000 mg | ORAL_TABLET | Freq: Every day | ORAL | 0 refills | Status: DC
Start: 2020-05-28 — End: 2020-05-28

## 2020-05-28 MED ORDER — METOPROLOL TARTRATE 25 MG PO TABS: 25.0000 mg | ORAL_TABLET | Freq: Two times a day (BID) | ORAL | 0 refills | Status: DC

## 2020-05-28 NOTE — Telephone Encounter (Signed)
Sent maintenance meds. Check [place] registry last filled Tramadol 04/26/2020.Marland KitchenJohny Chess
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
## 2020-07-02 ENCOUNTER — Other Ambulatory Visit: Payer: Self-pay | Admitting: Internal Medicine

## 2020-07-13 NOTE — Progress Notes (Signed)
Subjective:    Patient ID: Keith [name], male    DOB: 1955-10-11, 64 y.o.   MRN: [id number]  HPI The patient is here for follow up of their chronic medical problems, including DM, lumbar radiculopathy, htn, high chol, smoking, depression  His lower back fatigues easily.    He has some discoloration in his Right first toenail.  It is greenish.  He denies pain.  He denies injury.  He denies other toenails affected.   He did not take his BP medications over the weekend.  He often does not take them for short periods of time.     He takes his hyzaar and lipitor about once a week only.  The other medications he takes them more regularly, but not always daily.   Medications and allergies reviewed with patient and updated if appropriate.  Patient Active Problem List   Diagnosis Date Noted  . Burning pain 01/14/2020  . TFCC (triangular fibrocartilage complex) injury, left, initial encounter 09/05/2017  . Polyarthropathy 09/05/2017  . Arthritis of left wrist 08/05/2017  . Left wrist pain 07/08/2017  . S/P lumbar spinal fusion 05/14/2015  . Alcohol abuse 04/23/2015  . DM (diabetes mellitus) type II controlled with renal manifestation ([place]) 04/23/2015  . Lumbar radiculopathy 01/23/2015  . CARPAL TUNNEL SYNDROME 12/22/2009  . HYPERPLASIA PROSTATE UNS W/O UR OBST & OTH LUTS 12/22/2009  . DIVERTICULOSIS, COLON 10/03/2008  . COLONIC POLYPS, HX OF 10/03/2008  . Hyperlipidemia 08/02/2007  . Tobacco dependence due to cigarettes 02/06/2007  . Essential hypertension 02/06/2007    Current Outpatient Medications on File Prior to Visit  Medication Sig Dispense Refill  . atorvastatin (LIPITOR) 20 MG tablet Take 1 tablet (20 mg total) by mouth daily. 90 tablet 0  . cloNIDine (CATAPRES) 0.2 MG tablet Take 1 tablet (0.2 mg total) by mouth 2 (two) times daily. 180 tablet 0  . hydrALAZINE (APRESOLINE) 50 MG tablet Take 2 tablets (100 mg total) by mouth 2 (two) times daily. 360 tablet 0  .  losartan-hydrochlorothiazide (HYZAAR) 100-25 MG tablet Take 1 tablet by mouth daily. 90 tablet 0  . metoprolol tartrate (LOPRESSOR) 25 MG tablet Take 1 tablet (25 mg total) by mouth 2 (two) times daily. 180 tablet 0  . traMADol (ULTRAM) 50 MG tablet TAKE 1 TABLET BY MOUTH EVERY 12 HOURS AS NEEDED 60 tablet 0   No current facility-administered medications on file prior to visit.    Past Medical History:  Diagnosis Date  . Anxiety   . Cataract    beginnings   . Chronic kidney disease 04/23/15   ARF- "resolved"  . Colon polyp   . COPD (chronic obstructive pulmonary disease) (HCC)    per pt from smoking   . Dehydration 04/2015   Hospitalized for 4 days after abnormal labs found during preop appointment.  . Depression   . Diabetes mellitus without complication ([place])    J1B 04/24/15- diet controlled   . Diverticulosis   . Hyperlipidemia   . Hypertension   . Tobacco abuse     Past Surgical History:  Procedure Laterality Date  . COLONOSCOPY    . colonoscopy with polypectomy     polyp X 1  . 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 MAXIMUM ACCESS (MAS)POSTERIOR LUMBAR INTERBODY FUSION (PLIF) 2 LEVEL N/A 05/14/2015   Procedure: Lumbar three-lumbar four,  umbar four-lumbar five Maximum access posterior lumbar interbody fusion;  Surgeon: Eustace Moore, MD;  Location: [facility];  Service: Neurosurgery;  Laterality: N/A;  L3-4 L4-5 Maximum access posterior lumbar interbody fusion  . POLYPECTOMY    . WISDOM TOOTH EXTRACTION      Social History   Socioeconomic History  . Marital status: Married    Spouse name: Not on file  . Number of children: Not on file  . Years of education: Not on file  . Highest education level: Not on file  Occupational History  . Not on file  Tobacco Use  . Smoking status: Current Every Day Smoker    Packs/day: 1.50    Years: 40.00    Pack years: 60.00    Types: Cigarettes    Last attempt to quit: 04/19/2015    Years since quitting:  5.2  . Smokeless tobacco: Never Used  Vaping Use  . Vaping Use: Never used  Substance and Sexual Activity  . Alcohol use: Yes    Alcohol/week: 3.0 standard drinks    Types: 3 Cans of beer per week    Comment: daily drinks- 12 pack on the weekends case/weekend,   . Drug use: No  . Sexual activity: Not on file  Other Topics Concern  . Not on file  Social History Narrative  . Not on file   Social Determinants of Health   Financial Resource Strain:   . Difficulty of Paying Living Expenses: Not on file  Food Insecurity:   . Worried About Charity fundraiser in the Last Year: Not on file  . Ran Out of Food in the Last Year: Not on file  Transportation Needs:   . Lack of Transportation (Medical): Not on file  . Lack of Transportation (Non-Medical): Not on file  Physical Activity:   . Days of Exercise per Week: Not on file  . Minutes of Exercise per Session: Not on file  Stress:   . Feeling of Stress : Not on file  Social Connections:   . Frequency of Communication with Friends and Family: Not on file  . Frequency of Social Gatherings with Friends and Family: Not on file  . Attends Religious Services: Not on file  . Active Member of Clubs or Organizations: Not on file  . Attends Archivist Meetings: Not on file  . Marital Status: Not on file    Family History  Problem Relation Age of Onset  . Stroke Father   . Melanoma Father   . Stomach cancer Father   . Alcohol abuse Father   . Diabetes Mother   . Diabetes Brother   . Breast cancer Sister   . Colon cancer Neg Hx   . Esophageal cancer Neg Hx   . Rectal cancer Neg Hx   . Colon polyps Neg Hx     Review of Systems  Constitutional: Negative for chills and fever.  Respiratory: Negative for cough and shortness of breath.   Cardiovascular: Positive for chest pain (intermittent chest discomfort - improves with stretching). Negative for palpitations and leg swelling.  Gastrointestinal:       No gerd    Neurological: Negative for light-headedness and headaches.       Objective:   Vitals:   07/14/20 0915  BP: (!) 144/82  Pulse: 72  Temp: 98.1 F (36.7 C)  SpO2: 99%   BP Readings from Last 3 Encounters:  07/14/20 (!) 144/82  01/14/20 140/88  09/17/19 124/82   Wt Readings from Last 3 Encounters:  07/14/20 165 lb 12.8 oz (75.2 kg)  01/14/20 172  lb 6.4 oz (78.2 kg)  09/17/19 173 lb (78.5 kg)   Body mass index is 22.49 kg/m.   Physical Exam    Constitutional: Appears well-developed and well-nourished. No distress.  HENT:  Head: Normocephalic and atraumatic.  Neck: Neck supple. No tracheal deviation present. No thyromegaly present.  No cervical lymphadenopathy Cardiovascular: Normal rate, regular rhythm and normal heart sounds.   No murmur heard. No carotid bruit .  No edema Pulmonary/Chest: Effort normal and breath sounds normal. No respiratory distress. No has no wheezes. No rales.  Skin: Skin is warm and dry. Not diaphoretic.  Right first toenail with slight discoloration, but no thickening.  Other toenails appear normal Psychiatric: Normal mood and affect. Behavior is normal.   Diabetic Foot Exam - Simple   Simple Foot Form Diabetic Foot exam was performed with the following findings: Yes 07/14/2020  9:45 AM  Visual Inspection No deformities, no ulcerations, no other skin breakdown bilaterally: Yes Sensation Testing See comments: Yes Pulse Check Posterior Tibialis and Dorsalis pulse intact bilaterally: Yes Comments Minimal decrease in sensation in b/l feet       Assessment & Plan:    See Problem List for Assessment and Plan of chronic medical problems.    This visit occurred during the SARS-CoV-2 public health emergency.  Safety protocols were in place, including screening questions prior to the visit, additional usage of staff PPE, and extensive cleaning of exam room while observing appropriate contact time as indicated for disinfecting solutions.

## 2020-07-13 NOTE — Patient Instructions (Addendum)
°  Blood work was ordered.     Flu immunization administered today.     Medications changes include :   none     Please followup in 6 months

## 2020-07-14 ENCOUNTER — Ambulatory Visit (INDEPENDENT_AMBULATORY_CARE_PROVIDER_SITE_OTHER): Payer: 59 | Admitting: Internal Medicine

## 2020-07-14 ENCOUNTER — Other Ambulatory Visit: Payer: Self-pay

## 2020-07-14 ENCOUNTER — Telehealth: Payer: Self-pay

## 2020-07-14 ENCOUNTER — Encounter: Payer: Self-pay | Admitting: Internal Medicine

## 2020-07-14 VITALS — BP 144/82 | HR 72 | Temp 98.1°F | Ht 72.0 in | Wt 165.8 lb

## 2020-07-14 DIAGNOSIS — E7849 Other hyperlipidemia: Secondary | ICD-10-CM | POA: Diagnosis not present

## 2020-07-14 DIAGNOSIS — E1122 Type 2 diabetes mellitus with diabetic chronic kidney disease: Secondary | ICD-10-CM

## 2020-07-14 DIAGNOSIS — F17210 Nicotine dependence, cigarettes, uncomplicated: Secondary | ICD-10-CM

## 2020-07-14 DIAGNOSIS — I10 Essential (primary) hypertension: Secondary | ICD-10-CM

## 2020-07-14 DIAGNOSIS — Z23 Encounter for immunization: Secondary | ICD-10-CM

## 2020-07-14 DIAGNOSIS — F3289 Other specified depressive episodes: Secondary | ICD-10-CM

## 2020-07-14 DIAGNOSIS — B079 Viral wart, unspecified: Secondary | ICD-10-CM

## 2020-07-14 DIAGNOSIS — M5416 Radiculopathy, lumbar region: Secondary | ICD-10-CM | POA: Diagnosis not present

## 2020-07-14 DIAGNOSIS — F1010 Alcohol abuse, uncomplicated: Secondary | ICD-10-CM

## 2020-07-14 LAB — LIPID PANEL
Cholesterol: 177 mg/dL (ref 0–200)
HDL: 40.2 mg/dL (ref >39.00)
LDL Cholesterol: 118 mg/dL — ABNORMAL HIGH (ref 0–99)
NonHDL: 137.17
Total CHOL/HDL Ratio: 4
Triglycerides: 96 mg/dL (ref 0.0–149.0)
VLDL: 19.2 mg/dL (ref 0.0–40.0)

## 2020-07-14 LAB — CBC WITH DIFFERENTIAL/PLATELET
Basophils Absolute: 0.1 10*3/uL (ref 0.0–0.1)
Basophils Relative: 1.1 % (ref 0.0–3.0)
Eosinophils Absolute: 0.3 10*3/uL (ref 0.0–0.7)
Eosinophils Relative: 3.6 % (ref 0.0–5.0)
HCT: 49.1 % (ref 39.0–52.0)
Hemoglobin: 16.6 g/dL (ref 13.0–17.0)
Lymphocytes Relative: 23.1 % (ref 12.0–46.0)
Lymphs Abs: 1.7 10*3/uL (ref 0.7–4.0)
MCHC: 33.8 g/dL (ref 30.0–36.0)
MCV: 95.6 fl (ref 78.0–100.0)
Monocytes Absolute: 0.8 10*3/uL (ref 0.1–1.0)
Monocytes Relative: 11.3 % (ref 3.0–12.0)
Neutro Abs: 4.4 10*3/uL (ref 1.4–7.7)
Neutrophils Relative %: 60.9 % (ref 43.0–77.0)
Platelets: 215 10*3/uL (ref 150.0–400.0)
RBC: 5.14 Mil/uL (ref 4.22–5.81)
RDW: 14.6 % (ref 11.5–15.5)
WBC: 7.2 10*3/uL (ref 4.0–10.5)

## 2020-07-14 LAB — COMPREHENSIVE METABOLIC PANEL
ALT: 10 U/L (ref 0–53)
AST: 16 U/L (ref 0–37)
Albumin: 4.3 g/dL (ref 3.5–5.2)
Alkaline Phosphatase: 55 U/L (ref 39–117)
BUN: 11 mg/dL (ref 6–23)
CO2: 30 mEq/L (ref 19–32)
Calcium: 9.6 mg/dL (ref 8.4–10.5)
Chloride: 102 mEq/L (ref 96–112)
Creatinine, Ser: 0.95 mg/dL (ref 0.40–1.50)
GFR: 83.39 mL/min (ref >60.00)
Glucose, Bld: 114 mg/dL — ABNORMAL HIGH (ref 70–99)
Potassium: 4.1 mEq/L (ref 3.5–5.1)
Sodium: 139 mEq/L (ref 135–145)
Total Bilirubin: 0.5 mg/dL (ref 0.2–1.2)
Total Protein: 7.5 g/dL (ref 6.0–8.3)

## 2020-07-14 LAB — HEMOGLOBIN A1C: Hgb A1c MFr Bld: 6.1 % (ref 4.6–6.5)

## 2020-07-14 NOTE — Assessment & Plan Note (Signed)
Chronic Diet controlled Eye exam up-to-date He is fairly compliant with a low sugar/carbohydrate diet, but does consume more alcohol than he should Check A1c

## 2020-07-14 NOTE — Assessment & Plan Note (Signed)
Chronic Check lipid panel, CMP He is not taking Lipitor regularly-may only take it once a week Encouraged him to take this on a more regular basis

## 2020-07-14 NOTE — Assessment & Plan Note (Addendum)
Smoking cessation was discussed for more than 3 minutes.  The patient was counseled on the dangers of tobacco use, and was advised to quit and Wants to quit.  Reviewed ways of quitting smoking including nicotine replacement, vapping/e-cigarettes, cold [place], weaning off cigarettes, and pharmacotherapy (wellbutrin and chantix).  He can not take wellbutrin and does not want to take chantix. Patches would be his best bet at quitting, since he has quit with patches in the past.  We discussed that when the time is right knee is 100% committed to quitting I can either prescribe patches or he can call for free patches.

## 2020-07-14 NOTE — Telephone Encounter (Signed)
referral ordered 

## 2020-07-14 NOTE — Assessment & Plan Note (Addendum)
Chronic Has 50 oz of alcohol a night, has cut down on his drinking on the weekends Encouraged him to keep his alcohol intake to a minimum

## 2020-07-14 NOTE — Assessment & Plan Note (Signed)
Chronic Blood pressure not ideally controlled, but is okay He does not always take his medication on a daily basis, but takes the clonidine and hydralazine along with metoprolol fairly regularly.  He admits that he only takes the Hyzaar about once a week Encouraged compliance with his medication No changes today medication CMP

## 2020-07-14 NOTE — Assessment & Plan Note (Signed)
Chronic Takes tramadol twice daily as needed-he does not take when driving Medication does help Have discussed with him in the past that he cannot take this while drinking any alcohol Continue 50 mg of tramadol twice daily as needed only

## 2020-08-07 ENCOUNTER — Other Ambulatory Visit: Payer: Self-pay | Admitting: Internal Medicine

## 2020-08-07 NOTE — Telephone Encounter (Signed)
Last RF per controlled substance database: 07/02/20 Last OV: 07/14/20 Next OV: 01/19/21

## 2020-09-16 ENCOUNTER — Other Ambulatory Visit: Payer: Self-pay | Admitting: Internal Medicine

## 2020-10-17 ENCOUNTER — Other Ambulatory Visit: Payer: Self-pay | Admitting: Internal Medicine

## 2020-11-24 ENCOUNTER — Other Ambulatory Visit: Payer: Self-pay | Admitting: Internal Medicine

## 2020-11-26 ENCOUNTER — Other Ambulatory Visit: Payer: Self-pay | Admitting: Internal Medicine

## 2020-11-27 ENCOUNTER — Encounter (HOSPITAL_COMMUNITY): Payer: Self-pay

## 2020-11-27 ENCOUNTER — Observation Stay (HOSPITAL_COMMUNITY)
Admission: EM | Admit: 2020-11-27 | Discharge: 2020-11-28 | Disposition: A | Discharge status: Home / Self Care | Payer: Medicare Other | Attending: Cardiovascular Disease | Admitting: Cardiovascular Disease

## 2020-11-27 ENCOUNTER — Observation Stay (HOSPITAL_COMMUNITY): Payer: Medicare Other

## 2020-11-27 ENCOUNTER — Emergency Department (HOSPITAL_COMMUNITY): Payer: Medicare Other

## 2020-11-27 ENCOUNTER — Encounter (HOSPITAL_COMMUNITY)
Admission: EM | Disposition: A | Discharge status: Home / Self Care | Payer: Self-pay | Source: Home / Self Care | Attending: Emergency Medicine

## 2020-11-27 ENCOUNTER — Other Ambulatory Visit: Payer: Self-pay

## 2020-11-27 DIAGNOSIS — I214 Non-ST elevation (NSTEMI) myocardial infarction: Principal | ICD-10-CM | POA: Diagnosis present

## 2020-11-27 DIAGNOSIS — Z79899 Other long term (current) drug therapy: Secondary | ICD-10-CM | POA: Insufficient documentation

## 2020-11-27 DIAGNOSIS — Z20822 Contact with and (suspected) exposure to covid-19: Secondary | ICD-10-CM | POA: Insufficient documentation

## 2020-11-27 DIAGNOSIS — I200 Unstable angina: Secondary | ICD-10-CM

## 2020-11-27 DIAGNOSIS — F1010 Alcohol abuse, uncomplicated: Secondary | ICD-10-CM | POA: Insufficient documentation

## 2020-11-27 DIAGNOSIS — Z720 Tobacco use: Secondary | ICD-10-CM

## 2020-11-27 DIAGNOSIS — F17210 Nicotine dependence, cigarettes, uncomplicated: Secondary | ICD-10-CM | POA: Diagnosis not present

## 2020-11-27 DIAGNOSIS — I129 Hypertensive chronic kidney disease with stage 1 through stage 4 chronic kidney disease, or unspecified chronic kidney disease: Secondary | ICD-10-CM | POA: Diagnosis not present

## 2020-11-27 DIAGNOSIS — N189 Chronic kidney disease, unspecified: Secondary | ICD-10-CM | POA: Diagnosis not present

## 2020-11-27 DIAGNOSIS — J449 Chronic obstructive pulmonary disease, unspecified: Secondary | ICD-10-CM | POA: Insufficient documentation

## 2020-11-27 DIAGNOSIS — I10 Essential (primary) hypertension: Secondary | ICD-10-CM | POA: Diagnosis not present

## 2020-11-27 DIAGNOSIS — I249 Acute ischemic heart disease, unspecified: Secondary | ICD-10-CM | POA: Diagnosis not present

## 2020-11-27 DIAGNOSIS — I2510 Atherosclerotic heart disease of native coronary artery without angina pectoris: Secondary | ICD-10-CM | POA: Diagnosis not present

## 2020-11-27 DIAGNOSIS — R079 Chest pain, unspecified: Secondary | ICD-10-CM | POA: Diagnosis present

## 2020-11-27 DIAGNOSIS — E119 Type 2 diabetes mellitus without complications: Secondary | ICD-10-CM | POA: Insufficient documentation

## 2020-11-27 DIAGNOSIS — E785 Hyperlipidemia, unspecified: Secondary | ICD-10-CM | POA: Insufficient documentation

## 2020-11-27 HISTORY — PX: LEFT HEART CATH AND CORONARY ANGIOGRAPHY: CATH118249

## 2020-11-27 LAB — PROTIME-INR
INR: 0.9 (ref 0.8–1.2)
Prothrombin Time: 12 seconds (ref 11.4–15.2)

## 2020-11-27 LAB — GLUCOSE, CAPILLARY
Glucose-Capillary: 118 mg/dL — ABNORMAL HIGH (ref 70–99)
Glucose-Capillary: 184 mg/dL — ABNORMAL HIGH (ref 70–99)
Glucose-Capillary: 121 mg/dL — ABNORMAL HIGH (ref 70–99)

## 2020-11-27 LAB — CBC
HCT: 48.1 % (ref 39.0–52.0)
Hemoglobin: 16 g/dL (ref 13.0–17.0)
MCH: 32.1 pg (ref 26.0–34.0)
MCHC: 33.3 g/dL (ref 30.0–36.0)
MCV: 96.4 fL (ref 80.0–100.0)
Platelets: 217 10*3/uL (ref 150–400)
RBC: 4.99 MIL/uL (ref 4.22–5.81)
RDW: 14.4 % (ref 11.5–15.5)
WBC: 6.8 10*3/uL (ref 4.0–10.5)
nRBC: 0 % (ref 0.0–0.2)

## 2020-11-27 LAB — PHOSPHORUS: Phosphorus: 3.2 mg/dL (ref 2.5–4.6)

## 2020-11-27 LAB — COMPREHENSIVE METABOLIC PANEL
ALT: 17 U/L (ref 0–44)
AST: 48 U/L — ABNORMAL HIGH (ref 15–41)
Albumin: 3.7 g/dL (ref 3.5–5.0)
Alkaline Phosphatase: 76 U/L (ref 38–126)
Anion gap: 10 (ref 5–15)
BUN: 14 mg/dL (ref 8–23)
CO2: 26 mmol/L (ref 22–32)
Calcium: 9.4 mg/dL (ref 8.9–10.3)
Chloride: 101 mmol/L (ref 98–111)
Creatinine, Ser: 0.93 mg/dL (ref 0.61–1.24)
GFR, Estimated: >60 mL/min (ref >60)
Glucose, Bld: 135 mg/dL — ABNORMAL HIGH (ref 70–99)
Potassium: 4.1 mmol/L (ref 3.5–5.1)
Sodium: 137 mmol/L (ref 135–145)
Total Bilirubin: 0.6 mg/dL (ref 0.3–1.2)
Total Protein: 7.1 g/dL (ref 6.5–8.1)

## 2020-11-27 LAB — TROPONIN I (HIGH SENSITIVITY)
Troponin I (High Sensitivity): 3797 ng/L (ref <18)
Troponin I (High Sensitivity): 5708 ng/L (ref <18)
Troponin I (High Sensitivity): 12902 ng/L (ref <18)

## 2020-11-27 LAB — HEMOGLOBIN A1C
Hgb A1c MFr Bld: 6.1 % — ABNORMAL HIGH (ref 4.8–5.6)
Hgb A1c MFr Bld: 6.1 % — ABNORMAL HIGH (ref 4.8–5.6)
Mean Plasma Glucose: 128.37 mg/dL
Mean Plasma Glucose: 128.37 mg/dL

## 2020-11-27 LAB — LIPASE, BLOOD: Lipase: 29 U/L (ref 11–51)

## 2020-11-27 LAB — TSH: TSH: 1.047 u[IU]/mL (ref 0.350–4.500)

## 2020-11-27 LAB — T4, FREE: Free T4: 0.84 ng/dL (ref 0.61–1.12)

## 2020-11-27 LAB — MAGNESIUM: Magnesium: 2.2 mg/dL (ref 1.7–2.4)

## 2020-11-27 LAB — HIV ANTIBODY (ROUTINE TESTING W REFLEX): HIV Screen 4th Generation wRfx: NONREACTIVE

## 2020-11-27 LAB — APTT: aPTT: 30 seconds (ref 24–36)

## 2020-11-27 LAB — SARS CORONAVIRUS 2 BY RT PCR (HOSPITAL ORDER, PERFORMED IN ~~LOC~~ HOSPITAL LAB): SARS Coronavirus 2: NEGATIVE

## 2020-11-27 SURGERY — LEFT HEART CATH AND CORONARY ANGIOGRAPHY
Anesthesia: LOCAL

## 2020-11-27 MED ORDER — HEPARIN (PORCINE) IN NACL 1000-0.9 UT/500ML-% IV SOLN
INTRAVENOUS | Status: AC
  Filled 2020-11-27: qty 1000

## 2020-11-27 MED ORDER — HEPARIN (PORCINE) 25000 UT/250ML-% IV SOLN
850.0000 [IU]/h | INTRAVENOUS | Status: DC
  Administered 2020-11-27: 850 [IU]/h via INTRAVENOUS
  Filled 2020-11-27: qty 250

## 2020-11-27 MED ORDER — MIDAZOLAM HCL 2 MG/2ML IJ SOLN
INTRAMUSCULAR | Status: AC
  Filled 2020-11-27: qty 2

## 2020-11-27 MED ORDER — THIAMINE HCL 100 MG/ML IJ SOLN: 100.0000 mg | Freq: Every day | INTRAMUSCULAR | Status: DC

## 2020-11-27 MED ORDER — LABETALOL HCL 5 MG/ML IV SOLN: 10.0000 mg | INTRAVENOUS | Status: AC | PRN

## 2020-11-27 MED ORDER — HEPARIN SODIUM (PORCINE) 1000 UNIT/ML IJ SOLN
INTRAMUSCULAR | Status: AC
  Filled 2020-11-27: qty 1

## 2020-11-27 MED ORDER — ACETAMINOPHEN 325 MG PO TABS: 650.0000 mg | ORAL_TABLET | ORAL | Status: DC | PRN

## 2020-11-27 MED ORDER — ENSURE ENLIVE PO LIQD
237.0000 mL | Freq: Two times a day (BID) | ORAL | Status: DC
  Administered 2020-11-27 – 2020-11-28 (×2): 237 mL via ORAL

## 2020-11-27 MED ORDER — METOPROLOL TARTRATE 25 MG PO TABS
25.0000 mg | ORAL_TABLET | Freq: Two times a day (BID) | ORAL | Status: DC
  Administered 2020-11-27 – 2020-11-28 (×3): 25 mg via ORAL
  Filled 2020-11-27 (×3): qty 1

## 2020-11-27 MED ORDER — NITROGLYCERIN IN D5W 200-5 MCG/ML-% IV SOLN
0.0000 ug/min | INTRAVENOUS | Status: DC
  Administered 2020-11-27: 5 ug/min via INTRAVENOUS
  Filled 2020-11-27: qty 250

## 2020-11-27 MED ORDER — LORAZEPAM 1 MG PO TABS: 1.0000 mg | ORAL_TABLET | ORAL | Status: DC | PRN

## 2020-11-27 MED ORDER — SODIUM CHLORIDE 0.9 % WEIGHT BASED INFUSION: 1.0000 mL/kg/h | INTRAVENOUS | Status: DC

## 2020-11-27 MED ORDER — ONDANSETRON HCL 4 MG/2ML IJ SOLN: 4.0000 mg | Freq: Four times a day (QID) | INTRAMUSCULAR | Status: DC | PRN

## 2020-11-27 MED ORDER — MORPHINE SULFATE (PF) 2 MG/ML IV SOLN: 2.0000 mg | INTRAVENOUS | Status: DC | PRN

## 2020-11-27 MED ORDER — HEPARIN BOLUS VIA INFUSION
4000.0000 [IU] | Freq: Once | INTRAVENOUS | Status: AC
  Administered 2020-11-27: 4000 [IU] via INTRAVENOUS
  Filled 2020-11-27: qty 4000

## 2020-11-27 MED ORDER — IOHEXOL 350 MG/ML SOLN
INTRAVENOUS | Status: DC | PRN
  Administered 2020-11-27: 90 mL

## 2020-11-27 MED ORDER — ASPIRIN 81 MG PO CHEW
324.0000 mg | CHEWABLE_TABLET | Freq: Once | ORAL | Status: AC
  Administered 2020-11-27: 324 mg via ORAL
  Filled 2020-11-27: qty 4

## 2020-11-27 MED ORDER — SODIUM CHLORIDE 0.9 % IV SOLN: 250.0000 mL | INTRAVENOUS | Status: DC | PRN

## 2020-11-27 MED ORDER — NITROGLYCERIN 0.4 MG SL SUBL: 0.4000 mg | SUBLINGUAL_TABLET | SUBLINGUAL | Status: DC | PRN

## 2020-11-27 MED ORDER — HYDRALAZINE HCL 20 MG/ML IJ SOLN: 10.0000 mg | INTRAMUSCULAR | Status: AC | PRN

## 2020-11-27 MED ORDER — LIDOCAINE HCL (PF) 1 % IJ SOLN
INTRAMUSCULAR | Status: DC | PRN
  Administered 2020-11-27: 2 mL

## 2020-11-27 MED ORDER — LORAZEPAM 1 MG PO TABS
0.0000 mg | ORAL_TABLET | Freq: Four times a day (QID) | ORAL | Status: DC
  Administered 2020-11-27 (×2): 1 mg via ORAL
  Filled 2020-11-27 (×2): qty 1

## 2020-11-27 MED ORDER — CLOPIDOGREL BISULFATE 75 MG PO TABS
75.0000 mg | ORAL_TABLET | Freq: Every day | ORAL | Status: DC
  Administered 2020-11-28: 75 mg via ORAL
  Filled 2020-11-27: qty 1

## 2020-11-27 MED ORDER — SODIUM CHLORIDE 0.9% FLUSH: 3.0000 mL | INTRAVENOUS | Status: DC | PRN

## 2020-11-27 MED ORDER — SODIUM CHLORIDE 0.9 % WEIGHT BASED INFUSION: 3.0000 mL/kg/h | INTRAVENOUS | Status: AC

## 2020-11-27 MED ORDER — LIDOCAINE HCL (PF) 1 % IJ SOLN
INTRAMUSCULAR | Status: AC
  Filled 2020-11-27: qty 30

## 2020-11-27 MED ORDER — ATORVASTATIN CALCIUM 80 MG PO TABS
80.0000 mg | ORAL_TABLET | Freq: Every day | ORAL | Status: DC
  Administered 2020-11-27 – 2020-11-28 (×2): 80 mg via ORAL
  Filled 2020-11-27 (×2): qty 1

## 2020-11-27 MED ORDER — SODIUM CHLORIDE 0.9 % IV SOLN: INTRAVENOUS | Status: DC

## 2020-11-27 MED ORDER — FENTANYL CITRATE (PF) 100 MCG/2ML IJ SOLN
25.0000 ug | Freq: Once | INTRAMUSCULAR | Status: AC
  Administered 2020-11-27: 25 ug via INTRAVENOUS

## 2020-11-27 MED ORDER — VERAPAMIL HCL 2.5 MG/ML IV SOLN
INTRAVENOUS | Status: AC
  Filled 2020-11-27: qty 2

## 2020-11-27 MED ORDER — ALUM & MAG HYDROXIDE-SIMETH 200-200-20 MG/5ML PO SUSP
30.0000 mL | Freq: Once | ORAL | Status: AC
  Administered 2020-11-27: 30 mL via ORAL
  Filled 2020-11-27: qty 30

## 2020-11-27 MED ORDER — LORAZEPAM 1 MG PO TABS: 0.0000 mg | ORAL_TABLET | Freq: Two times a day (BID) | ORAL | Status: DC

## 2020-11-27 MED ORDER — ADULT MULTIVITAMIN W/MINERALS CH
1.0000 | ORAL_TABLET | Freq: Every day | ORAL | Status: DC
  Administered 2020-11-27 – 2020-11-28 (×2): 1 via ORAL
  Filled 2020-11-27 (×2): qty 1

## 2020-11-27 MED ORDER — VERAPAMIL HCL 2.5 MG/ML IV SOLN: INTRAVENOUS | Status: DC | PRN

## 2020-11-27 MED ORDER — FENTANYL CITRATE (PF) 100 MCG/2ML IJ SOLN
INTRAMUSCULAR | Status: AC
  Filled 2020-11-27: qty 2

## 2020-11-27 MED ORDER — INSULIN ASPART 100 UNIT/ML ~~LOC~~ SOLN
0.0000 [IU] | Freq: Three times a day (TID) | SUBCUTANEOUS | Status: DC
  Administered 2020-11-27: 1 [IU] via SUBCUTANEOUS
  Filled 2020-11-27: qty 0.06

## 2020-11-27 MED ORDER — CLONIDINE HCL 0.2 MG PO TABS
0.2000 mg | ORAL_TABLET | Freq: Two times a day (BID) | ORAL | Status: DC
  Administered 2020-11-27 – 2020-11-28 (×3): 0.2 mg via ORAL
  Filled 2020-11-27 (×3): qty 1

## 2020-11-27 MED ORDER — SODIUM CHLORIDE 0.9% FLUSH
3.0000 mL | Freq: Two times a day (BID) | INTRAVENOUS | Status: DC
  Administered 2020-11-27 – 2020-11-28 (×2): 3 mL via INTRAVENOUS

## 2020-11-27 MED ORDER — SODIUM CHLORIDE 0.9% FLUSH: 3.0000 mL | Freq: Two times a day (BID) | INTRAVENOUS | Status: DC

## 2020-11-27 MED ORDER — HYDRALAZINE HCL 50 MG PO TABS
100.0000 mg | ORAL_TABLET | Freq: Two times a day (BID) | ORAL | Status: DC
  Administered 2020-11-27 – 2020-11-28 (×3): 100 mg via ORAL
  Filled 2020-11-27 (×3): qty 2

## 2020-11-27 MED ORDER — ASPIRIN 81 MG PO CHEW: 81.0000 mg | CHEWABLE_TABLET | Freq: Every day | ORAL | Status: DC

## 2020-11-27 MED ORDER — NITROGLYCERIN 1 MG/10 ML FOR IR/CATH LAB
INTRA_ARTERIAL | Status: AC
  Filled 2020-11-27: qty 10

## 2020-11-27 MED ORDER — MIDAZOLAM HCL 2 MG/2ML IJ SOLN
1.0000 mg | Freq: Once | INTRAMUSCULAR | Status: AC
  Administered 2020-11-27: 1 mg via INTRAVENOUS

## 2020-11-27 MED ORDER — THIAMINE HCL 100 MG PO TABS
100.0000 mg | ORAL_TABLET | Freq: Every day | ORAL | Status: DC
  Administered 2020-11-27 – 2020-11-28 (×2): 100 mg via ORAL
  Filled 2020-11-27 (×2): qty 1

## 2020-11-27 MED ORDER — FOLIC ACID 1 MG PO TABS
1.0000 mg | ORAL_TABLET | Freq: Every day | ORAL | Status: DC
Start: 2020-11-27 — End: 2020-11-28
  Administered 2020-11-27 – 2020-11-28 (×2): 1 mg via ORAL
  Filled 2020-11-27 (×2): qty 1

## 2020-11-27 MED ORDER — VERAPAMIL HCL 2.5 MG/ML IV SOLN
INTRA_ARTERIAL | Status: DC | PRN
  Administered 2020-11-27: 15 mL via INTRA_ARTERIAL

## 2020-11-27 MED ORDER — LORAZEPAM 2 MG/ML IJ SOLN: 1.0000 mg | INTRAMUSCULAR | Status: DC | PRN

## 2020-11-27 MED ORDER — SODIUM CHLORIDE 0.9 % IV SOLN: INTRAVENOUS | Status: AC

## 2020-11-27 MED ORDER — ASPIRIN EC 81 MG PO TBEC
81.0000 mg | DELAYED_RELEASE_TABLET | Freq: Every day | ORAL | Status: DC
  Administered 2020-11-28: 81 mg via ORAL
  Filled 2020-11-27: qty 1

## 2020-11-27 MED ORDER — ATORVASTATIN CALCIUM 80 MG PO TABS: 80.0000 mg | ORAL_TABLET | Freq: Every day | ORAL | Status: DC

## 2020-11-27 MED ORDER — ZOLPIDEM TARTRATE 5 MG PO TABS: 5.0000 mg | ORAL_TABLET | Freq: Every evening | ORAL | Status: DC | PRN

## 2020-11-27 MED ORDER — HEPARIN SODIUM (PORCINE) 1000 UNIT/ML IJ SOLN
INTRAMUSCULAR | Status: DC | PRN
  Administered 2020-11-27: 3500 [IU] via INTRAVENOUS

## 2020-11-27 SURGICAL SUPPLY — 12 items
CATH INFINITI 5FR ANG PIGTAIL (CATHETERS) ×2 IMPLANT
CATH OPTITORQUE TIG 4.0 5F (CATHETERS) ×2 IMPLANT
DEVICE RAD COMP TR BAND LRG (VASCULAR PRODUCTS) ×2 IMPLANT
GLIDESHEATH SLEND A-KIT 6F 22G (SHEATH) ×2 IMPLANT
GUIDEWIRE INQWIRE 1.5J.035X260 (WIRE) ×1 IMPLANT
INQWIRE 1.5J .035X260CM (WIRE) ×2
KIT HEART LEFT (KITS) ×2 IMPLANT
PACK CARDIAC CATHETERIZATION (CUSTOM PROCEDURE TRAY) ×2 IMPLANT
SYR MEDRAD MARK 7 150ML (SYRINGE) ×2 IMPLANT
TRANSDUCER W/STOPCOCK (MISCELLANEOUS) ×2 IMPLANT
TUBING CIL FLEX 10 FLL-RA (TUBING) ×2 IMPLANT
WIRE HI TORQ VERSACORE-J 145CM (WIRE) ×2 IMPLANT

## 2020-11-27 NOTE — ED Notes (Signed)
Pt placed on continuous cardiac, BP, and O2 monitoring

## 2020-11-27 NOTE — Progress Notes (Addendum)
Pt arrives to bay 6, cath lab holding, connected to monitor, see flowsheets, IV heparin (850units/hour) and nitroglycerin (20mcg/min) infusing, safety maintained

## 2020-11-27 NOTE — Interval H&P Note (Signed)
Cath Lab Visit (complete for each Cath Lab visit)  Clinical Evaluation Leading to the Procedure:   ACS: Yes.    Non-ACS:    Anginal Classification: CCS II  Anti-ischemic medical therapy: Minimal Therapy (1 class of medications)  Non-Invasive Test Results: No non-invasive testing performed  Prior CABG: No previous CABG      History and Physical Interval Note:  11/27/2020 12:38 PM  Keith [name]  has presented today for surgery, with the diagnosis of nstemi.  The various methods of treatment have been discussed with the patient and family. After consideration of risks, benefits and other options for treatment, the patient has consented to  Procedure(s): LEFT HEART CATH AND CORONARY ANGIOGRAPHY (N/A) as a surgical intervention.  The patient's history has been reviewed, patient examined, no change in status, stable for surgery.  I have reviewed the patient's chart and labs.  Questions were answered to the patient's satisfaction.     Quay Burow

## 2020-11-27 NOTE — ED Provider Notes (Signed)
I provided a substantive portion of the care of this patient.  I personally performed the entirety of the medical decision making for this encounter.  EKG Interpretation  Date/Time:  Thursday November 27 2020 08:12:35 EDT Ventricular Rate:  64 PR Interval:  144 QRS Duration: 104 QT Interval:  399 QTC Calculation: 412 R Axis:   -8 Text Interpretation: Sinus rhythm RSR' in V1 or V2, right VCD or RVH ST elevation, consider inferior injury 12 Lead; [place] Confirmed by Lacretia Leigh ([id number]) on 11/27/2020 9:70:9 AM 65 year old male who presents with constant chest pain since yesterday.  Pain does not radiate to his back.  EKG x2 here is without acute ischemic changes.  Troponin is elevated at almost 3800.  Will start patient nitroglycerin, heparin and consult cardiology   Lacretia Leigh, MD 11/27/20 [phone]

## 2020-11-27 NOTE — ED Triage Notes (Addendum)
Patient c/o chest pain under bilateral breast areas since last night. Patient denies any SOB. Patient does c/o heartburn
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
## 2020-11-27 NOTE — H&P (Addendum)
Cardiology Admission History and Physical:   Patient ID: Keith [name] MRN: [id number]; DOB: Jul 14, 1956   Admission date: 11/27/2020  PCP:  Binnie Rail, MD   [place]  Cardiologist:  No primary care provider on file. NEW Advanced Practice Provider:  No care team member to display Electrophysiologist:  None        Chief Complaint:  Chest pain, no SOB  Patient Profile:   Keith [name] is a 65 y.o. male with DM-2, COPD, + tobacco, HLD and HTN no prior cardiac issues presents with 12 hours of chest pressure across chest.  History of Present Illness:   Keith [name] with above hx developed chest pain across chest beginning at 8:30 pm last evening while sitting.  No SOB, though feels he is breathing more shallow, no pain with deep breath.  No diaphoresis no nausea at one point radiation to Rt jaw but that resolved.  Here in ER still with discomfort, just started IV NTG now at 3 mcg and IV heparin going.  Pain continues.    He thought it might be indigestion.  No FH of CAD, though father with CVA.  Pt presented to ER at [facility]  Pt has rec'd Mylanta without improvement and ASA 324 mg.  Still with chest pain.  EKG:  The ECG that was done today 11/27/20 was personally reviewed and demonstrates SR initially artifact and appeared to have mild ST elevation inf. But on last EKG improved, he does have peaked T waves ant. Will repeat.  Hs troponin 3797   Na 137, K+ 4.1 BUN 14, Cr 0.93 AST 48 ALT 17   HGB 16 HCT 48 plts 217 and WBC 6.8  2 V CXR: IMPRESSION: Minimal bronchitic changes.  COVID test done results pending.  No colds fevers, productive cough in mornings for years.   Pt has no hx of CAD, stroke or GI bleed, remote hx of diverticulosis. + tobacco and ETOH use   Past Medical History:  Diagnosis Date  . Anxiety   . Cataract    beginnings   . Chronic kidney disease 04/23/15   ARF- "resolved"  . Colon polyp   . COPD (chronic obstructive pulmonary  disease) (HCC)    per pt from smoking   . Dehydration 04/2015   Hospitalized for 4 days after abnormal labs found during preop appointment.  . Depression   . Diabetes mellitus without complication ([place])    X3K 04/24/15- diet controlled   . Diverticulosis   . Hyperlipidemia   . Hypertension   . Tobacco abuse     Past Surgical History:  Procedure Laterality Date  . COLONOSCOPY    . colonoscopy with polypectomy     polyp X 1  . 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 MAXIMUM ACCESS (MAS)POSTERIOR LUMBAR INTERBODY FUSION (PLIF) 2 LEVEL N/A 05/14/2015   Procedure: Lumbar three-lumbar four,  umbar four-lumbar five Maximum access posterior lumbar interbody fusion;  Surgeon: Eustace Moore, MD;  Location: [facility];  Service: Neurosurgery;  Laterality: N/A;  L3-4 L4-5 Maximum access posterior lumbar interbody fusion  . POLYPECTOMY    . WISDOM TOOTH EXTRACTION       Medications Prior to Admission: Prior to Admission medications   Medication Sig Start Date End Date Taking? Authorizing Provider  atorvastatin (LIPITOR) 20 MG tablet Take 1 tablet (20 mg total) by mouth daily. 05/28/20  Yes Binnie Rail, MD  cloNIDine (  CATAPRES) 0.2 MG tablet Take 1 tablet (0.2 mg total) by mouth 2 (two) times daily. 05/28/20  Yes Burns, Claudina Lick, MD  hydrALAZINE (APRESOLINE) 50 MG tablet Take 2 tablets (100 mg total) by mouth 2 (two) times daily. 05/28/20  Yes Burns, Claudina Lick, MD  losartan-hydrochlorothiazide (HYZAAR) 100-25 MG tablet Take 1 tablet by mouth daily. 05/28/20  Yes Burns, Claudina Lick, MD  metoprolol tartrate (LOPRESSOR) 25 MG tablet Take 1 tablet (25 mg total) by mouth 2 (two) times daily. 05/28/20  Yes Burns, Claudina Lick, MD  neomycin-bacitracin-polymyxin (NEOSPORIN) ointment Apply 1 application topically as needed for wound care.   Yes [provider]  traMADol (ULTRAM) 50 MG tablet TAKE 1 TABLET BY MOUTH EVERY 12 HOURS AS NEEDED Patient taking differently: Take 50 mg by  mouth every 12 (twelve) hours as needed for moderate pain. 10/17/20  Yes Janith Lima, MD     Allergies:    Allergies  Allergen Reactions  . Bupropion Hcl     REACTION: rash @ pressure areas (waist, groin , axillae)  . Penicillins     ? Rash , hives  . Amoxicillin Itching and Rash    Social History:   Social History   Socioeconomic History  . Marital status: Married    Spouse name: Not on file  . Number of children: Not on file  . Years of education: Not on file  . Highest education level: Not on file  Occupational History  . Not on file  Tobacco Use  . Smoking status: Current Every Day Smoker    Packs/day: 1.50    Years: 40.00    Pack years: 60.00    Types: Cigarettes    Last attempt to quit: 04/19/2015    Years since quitting: 5.6  . Smokeless tobacco: Never Used  Vaping Use  . Vaping Use: Never used  Substance and Sexual Activity  . Alcohol use: Yes    Alcohol/week: 3.0 standard drinks    Types: 3 Cans of beer per week    Comment: daily drinks- 12 pack on the weekends case/weekend,   . Drug use: No  . Sexual activity: Not on file  Other Topics Concern  . Not on file  Social History Narrative  . Not on file   Social Determinants of Health   Financial Resource Strain: Not on file  Food Insecurity: Not on file  Transportation Needs: Not on file  Physical Activity: Not on file  Stress: Not on file  Social Connections: Not on file  Intimate Partner Violence: Not on file    Family History:   The patient's family history includes Alcohol abuse in his father; Breast cancer in his sister; Diabetes in his brother and mother; Melanoma in his father; Stomach cancer in his father; Stroke in his father. There is no history of Colon cancer, Esophageal cancer, Rectal cancer, or Colon polyps.    ROS:  Please see the history of present illness.  General:no colds or fevers, no weight changes Skin:no rashes or ulcers HEENT:no blurred vision, no congestion CV:see  HPI PUL:see HPI GI:no diarrhea constipation or melena, no indigestion GU:no hematuria, no dysuria MS:no joint pain, no claudication Neuro:no syncope, no lightheadedness Endo:+ diabetes has been controlled, no thyroid disease  All other ROS reviewed and negative.     Physical Exam/Data:   Vitals:   11/27/20 0830 11/27/20 0903 11/27/20 0930 11/27/20 1000  BP: (!) 152/94 (!) 153/78 (!) 146/70 (!) 159/87  Pulse: (!) 53 (!) 51 (!) 50 (!)  54  Resp: (!) 22 (!) 23 19 13   Temp:      TempSrc:      SpO2: 99% 98% 98% 97%  Weight:      Height:       No intake or output data in the 24 hours ending 11/27/20 1047 Last 3 Weights 11/27/2020 07/14/2020 01/14/2020  Weight (lbs) 162 lb 165 lb 12.8 oz 172 lb 6.4 oz  Weight (kg) 73.483 kg 75.206 kg 78.2 kg     Body mass index is 21.97 kg/m.  General:  Well nourished, well developed, in no acute distress, though uncomfortable with chest pain HEENT: normal Lymph: no adenopathy Neck: no JVD Endocrine:  No thryomegaly Vascular: No carotid bruits; pedal pulses 2+ bilaterally  Cardiac:  normal S1, S2; RRR; no murmur gallup rub or click Lungs:  clear to auscultation bilaterally, no wheezing, rhonchi or rales  Abd: soft, nontender, no hepatomegaly  Ext: no lower ext edema Musculoskeletal:  No deformities, BUE and BLE strength normal and equal Skin: warm and dry  Neuro:  Alert and oriented X 3 MAE follows commandst, no focal abnormalities noted Psych:  Normal affect    Relevant CV Studies: None Plan cath and echo to be done  Laboratory Data:  High Sensitivity Troponin:   Recent Labs  Lab 11/27/20 0817  TROPONINIHS 3,797*      Chemistry Recent Labs  Lab 11/27/20 0817  NA 137  K 4.1  CL 101  CO2 26  GLUCOSE 135*  BUN 14  CREATININE 0.93  CALCIUM 9.4  GFRNONAA >60  ANIONGAP 10    Recent Labs  Lab 11/27/20 0817  PROT 7.1  ALBUMIN 3.7  AST 48*  ALT 17  ALKPHOS 76  BILITOT 0.6   Hematology Recent Labs  Lab 11/27/20 0817   WBC 6.8  RBC 4.99  HGB 16.0  HCT 48.1  MCV 96.4  MCH 32.1  MCHC 33.3  RDW 14.4  PLT 217   BNPNo results for input(s): BNP, PROBNP in the last 168 hours.  DDimer No results for input(s): DDIMER in the last 168 hours.   Radiology/Studies:  DG Chest 2 View  Result Date: 11/27/2020 CLINICAL DATA:  Bilateral anterior chest pain beneath the breast areas since last night. Smoker. EXAM: CHEST - 2 VIEW COMPARISON:  04/24/2015 FINDINGS: Normal sized heart. Lungs. Minimal peribronchial thickening. Stable mild scoliosis. IMPRESSION: Minimal bronchitic changes. Electronically Signed   By: Claudie Revering M.D.   On: 11/27/2020 09:00     Assessment and Plan:     Acute NSTEMI with elevated troponin and ongoing chest pain.  Began at 8:30 pm last evening.  Pt calling his wife now. Plan for cardiac cath and possible stent.  Will continues hs Troponin, repeat EKG and continue IV heparin and IV NTG.  Have discussed with Dr. Margaretann Loveless and she will see upon [facility] at [facility]. Plan for echo this admit as well.   With ongoing pain he will go straight to cath lab.  The patient understands that risks included but are not limited to stroke (1 in 1000), death (1 in 82), kidney failure [usually temporary] (1 in 500), bleeding (1 in 200), allergic reaction [possibly serious] (1 in 200).   1.    HTN mildly elevated titrating NTG IV at home lopressor 25 BID, continue, losartan HCTZ 100-25 daily continue, hydralazine 100 BID, catapress 0.2 BID --held losartan HCTZ until eval post cath.           HLD on statin, lipitor 20  will increase to 80 mg. And check labs.            Tobacco and ETOH use, smokes 1.5 to 2 pk per day since age 64, though did stop for 5 years but resumed.   ETOH beer usually up to 6 pk per day, other days 1-2 will add ativan as needed.   May need nicoderm patch.             Diabetes-2 per IM and last A1c in Dec 2021 was 6.1 controlled with diet add SSI for now. On no medications at home.   Risk  Assessment/Risk Scores:     TIMI Risk Score for Unstable Angina or Non-ST Elevation MI:   The patient's TIMI risk score is 4, which indicates a 20% risk of all cause mortality, new or recurrent myocardial infarction or need for urgent revascularization in the next 14 days.       Severity of Illness: The appropriate patient status for this patient is INPATIENT. Inpatient status is judged to be reasonable and necessary in order to provide the required intensity of service to ensure the patient's safety. The patient's presenting symptoms, physical exam findings, and initial radiographic and laboratory data in the context of their chronic comorbidities is felt to place them at high risk for further clinical deterioration. Furthermore, it is not anticipated that the patient will be medically stable for discharge from the hospital within 2 midnights of admission. The following factors support the patient status of inpatient.   " The patient's presenting symptoms include chest pain since 8:30 pm last evening. . " The worrisome physical exam findings include none. " The initial radiographic and laboratory data are worrisome because of elevated troponin in combination with ongoing chest pain. " The chronic co-morbidities include HTN, DM, HLD tobacco use.   * I certify that at the point of admission it is my clinical judgment that the patient will require inpatient hospital care spanning beyond 2 midnights from the point of admission due to high intensity of service, high risk for further deterioration and high frequency of surveillance required.*    For questions or updates, please contact [place] Please consult [email or web address] for contact info under     Signed, Cecilie Kicks, NP  11/27/2020 10:47 AM   Patient seen and examined with Cecilie Kicks NP.  Agree as above, with the following exceptions and changes as noted below. 65 yo with HTN, DM2, COPD, HLD and etoh use who presents with typical  angina, with troponin elevation and ECG changes consistent with high risk NSTEMI. Gen: NAD, CV: RRR, no murmurs, Lungs: clear, Abd: soft, Extrem: Warm, well perfused, no edema, Neuro/Psych: alert and oriented x 3, normal mood and affect. All available labs, radiology testing, previous records reviewed. Heparin IV and nitro IV started. Chest pain 7/10, for management of chest pain he will require LHC today.  Seen in holding bay at [facility]. Proceed to cath. Consent confirmed and noted above.   Elouise Munroe, MD 11/27/20 12:15 PM

## 2020-11-27 NOTE — ED Notes (Signed)
[facility] here to transport patient to cath lab. Report given to [facility]

## 2020-11-27 NOTE — ED Notes (Signed)
Report called to stephanie brown at cath lab

## 2020-11-27 NOTE — Progress Notes (Signed)
Pt had 8 beats of vtach. Pt asymptomatic. Will continue to monitor

## 2020-11-27 NOTE — Progress Notes (Signed)
ANTICOAGULATION CONSULT NOTE - Initial Consult  Pharmacy Consult for Heparin Indication: chest pain/ACS  Allergies  Allergen Reactions  . Bupropion Hcl     REACTION: rash @ pressure areas (waist, groin , axillae)  . Penicillins     ? Rash , hives  . Amoxicillin Itching and Rash    Patient Measurements: Height: 6' (182.9 cm) Weight: 73.5 kg (162 lb) IBW/kg (Calculated) : 77.6 Heparin Dosing Weight: TBW  Vital Signs: Temp: 97.8 F (36.6 C) (04/21 0809) Temp Source: Oral (04/21 0809) BP: 146/70 (04/21 0930) Pulse Rate: 50 (04/21 0930)  Labs: Recent Labs    11/27/20 0817  HGB 16.0  HCT 48.1  PLT 217  CREATININE 0.93  TROPONINIHS 3,797*    Estimated Creatinine Clearance: 81.2 mL/min (by C-G formula based on SCr of 0.93 mg/dL).   Medical History: Past Medical History:  Diagnosis Date  . Anxiety   . Cataract    beginnings   . Chronic kidney disease 04/23/15   ARF- "resolved"  . Colon polyp   . COPD (chronic obstructive pulmonary disease) (HCC)    per pt from smoking   . Dehydration 04/2015   Hospitalized for 4 days after abnormal labs found during preop appointment.  . Depression   . Diabetes mellitus without complication ([place])    X5M 04/24/15- diet controlled   . Diverticulosis   . Hyperlipidemia   . Hypertension   . Tobacco abuse     Medications:  Scheduled:   Infusions:  . nitroGLYCERIN      Assessment: 7 yoM presents to ED on 4/21 with chest pain that started last night on 4/20.  Pharmacy is consulted to dose heparin.   No prior to admission anticoagulation noted.  Baseline coags pending. CBC: Hgb and Plt WNL SCr 0.93 Troponin 3797  Goal of Therapy:  Heparin level 0.3-0.7 units/ml Monitor platelets by anticoagulation protocol: Yes   Plan:  Baseline PTT, PT/INR Give heparin 4000 units bolus IV x 1 Start heparin IV infusion at 850 units/hr Heparin level 6 hours after starting Daily heparin level and CBC   Gretta Arab PharmD,  BCPS Clinical Pharmacist [facility] main pharmacy [phone] 11/27/2020 9:55 AM
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
## 2020-11-27 NOTE — ED Provider Notes (Signed)
Keith [name]   CSN: [id number] Arrival date & time: 11/27/20  6301     History Chief Complaint  Patient presents with  . Chest Pain    Keith [name] is a 65 y.o. male with past medical history significant for HTN, HLD, tobacco use disorder, COPD, and alcohol use disorder who presents to the ED with complaints of chest pain.  On my examination, patient reports that he was initially had behind his house drinking a beer and smoking a cigarette when he developed lower chest discomfort.  He then went back into his house and since then has been experiencing 7 out of 10 pain in inframamillary folds bilaterally.  He states that the pain does not radiate and he denies any associated diaphoresis, nausea, or shortness of breath.  He states that his symptoms are improved with shallow respirations.  He denies any history of similar.  He states that he suspects that this was reflux disease, but does not take anything regularly.  He states that he has been constipated recently, as well.  Patient endorses having a primary care provider and states that he has been taking all of his medications, as directed.  He denies any hemoptysis, history of clots or clotting disorder, unilateral extremity swelling or edema, or recent surgery/immobilizations.  He also denies any fevers, chills, or new/worsening cough.  HPI     Past Medical History:  Diagnosis Date  . Anxiety   . Cataract    beginnings   . Chronic kidney disease 04/23/15   ARF- "resolved"  . Colon polyp   . COPD (chronic obstructive pulmonary disease) (HCC)    per pt from smoking   . Dehydration 04/2015   Hospitalized for 4 days after abnormal labs found during preop appointment.  . Depression   . Diabetes mellitus without complication ([place])    S0F 04/24/15- diet controlled   . Diverticulosis   . Hyperlipidemia   . Hypertension   . Tobacco abuse     Patient Active Problem List   Diagnosis  Date Noted  . Burning pain 01/14/2020  . TFCC (triangular fibrocartilage complex) injury, left, initial encounter 09/05/2017  . Polyarthropathy 09/05/2017  . Arthritis of left wrist 08/05/2017  . Left wrist pain 07/08/2017  . S/P lumbar spinal fusion 05/14/2015  . Alcohol abuse 04/23/2015  . DM (diabetes mellitus) type II controlled with renal manifestation ([place]) 04/23/2015  . Lumbar radiculopathy 01/23/2015  . CARPAL TUNNEL SYNDROME 12/22/2009  . HYPERPLASIA PROSTATE UNS W/O UR OBST & OTH LUTS 12/22/2009  . DIVERTICULOSIS, COLON 10/03/2008  . COLONIC POLYPS, HX OF 10/03/2008  . Hyperlipidemia 08/02/2007  . Tobacco dependence due to cigarettes 02/06/2007  . Essential hypertension 02/06/2007    Past Surgical History:  Procedure Laterality Date  . COLONOSCOPY    . colonoscopy with polypectomy     polyp X 1  . 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 MAXIMUM ACCESS (MAS)POSTERIOR LUMBAR INTERBODY FUSION (PLIF) 2 LEVEL N/A 05/14/2015   Procedure: Lumbar three-lumbar four,  umbar four-lumbar five Maximum access posterior lumbar interbody fusion;  Surgeon: Eustace Moore, MD;  Location: [facility];  Service: Neurosurgery;  Laterality: N/A;  L3-4 L4-5 Maximum access posterior lumbar interbody fusion  . POLYPECTOMY    . WISDOM TOOTH EXTRACTION         Family History  Problem Relation Age of Onset  . Stroke Father   . Melanoma Father   .  Stomach cancer Father   . Alcohol abuse Father   . Diabetes Mother   . Diabetes Brother   . Breast cancer Sister   . Colon cancer Neg Hx   . Esophageal cancer Neg Hx   . Rectal cancer Neg Hx   . Colon polyps Neg Hx     Social History   Tobacco Use  . Smoking status: Current Every Day Smoker    Packs/day: 1.50    Years: 40.00    Pack years: 60.00    Types: Cigarettes    Last attempt to quit: 04/19/2015    Years since quitting: 5.6  . Smokeless tobacco: Never Used  Vaping Use  . Vaping Use: Never used  Substance  Use Topics  . Alcohol use: Yes    Alcohol/week: 3.0 standard drinks    Types: 3 Cans of beer per week    Comment: daily drinks- 12 pack on the weekends case/weekend,   . Drug use: No    Home Medications Prior to Admission medications   Medication Sig Start Date End Date Taking? Authorizing Provider  atorvastatin (LIPITOR) 20 MG tablet Take 1 tablet (20 mg total) by mouth daily. 05/28/20  Yes Burns, Claudina Lick, MD  cloNIDine (CATAPRES) 0.2 MG tablet Take 1 tablet (0.2 mg total) by mouth 2 (two) times daily. 05/28/20  Yes Burns, Claudina Lick, MD  hydrALAZINE (APRESOLINE) 50 MG tablet Take 2 tablets (100 mg total) by mouth 2 (two) times daily. 05/28/20  Yes Burns, Claudina Lick, MD  losartan-hydrochlorothiazide (HYZAAR) 100-25 MG tablet Take 1 tablet by mouth daily. 05/28/20  Yes Burns, Claudina Lick, MD  metoprolol tartrate (LOPRESSOR) 25 MG tablet Take 1 tablet (25 mg total) by mouth 2 (two) times daily. 05/28/20  Yes Burns, Claudina Lick, MD  neomycin-bacitracin-polymyxin (NEOSPORIN) ointment Apply 1 application topically as needed for wound care.   Yes [provider]  traMADol (ULTRAM) 50 MG tablet TAKE 1 TABLET BY MOUTH EVERY 12 HOURS AS NEEDED Patient taking differently: Take 50 mg by mouth every 12 (twelve) hours as needed for moderate pain. 10/17/20  Yes Janith Lima, MD    Allergies    Bupropion hcl, Penicillins, and Amoxicillin  Review of Systems   Review of Systems  All other systems reviewed and are negative.   Physical Exam Updated Vital Signs BP (!) 146/70   Pulse (!) 50   Temp 97.8 F (36.6 C) (Oral)   Resp 19   Ht 6' (1.829 m)   Wt 73.5 kg   SpO2 98%   BMI 21.97 kg/m   Physical Exam Vitals and nursing [name] reviewed. Exam conducted with a chaperone present.  Constitutional:      General: He is not in acute distress.    Appearance: He is not toxic-appearing.  HENT:     Head: Normocephalic and atraumatic.  Eyes:     General: No scleral icterus.    Conjunctiva/sclera:  Conjunctivae normal.  Cardiovascular:     Rate and Rhythm: Normal rate and regular rhythm.     Pulses: Normal pulses.     Comments: Peripheral pulses intact and symmetric. Pulmonary:     Effort: Pulmonary effort is normal. No respiratory distress.     Comments: No chest wall tenderness to palpation.  No wheezing.  CTA bilaterally.  No increased work of breathing. Chest:     Chest wall: No tenderness.  Abdominal:     General: Abdomen is flat. There is no distension.     Palpations: Abdomen is  soft.     Tenderness: There is no abdominal tenderness. There is no guarding.     Comments: Soft, nondistended.  No tenderness, specifically none in right upper quadrant or epigastrium.  Musculoskeletal:     Cervical back: Normal range of motion. No rigidity.     Right lower leg: No edema.     Left lower leg: No edema.     Comments: No peripheral edema.  Skin:    General: Skin is dry.     Capillary Refill: Capillary refill takes less than 2 seconds.  Neurological:     Mental Status: He is alert and oriented to person, place, and time.     GCS: GCS eye subscore is 4. GCS verbal subscore is 5. GCS motor subscore is 6.  Psychiatric:        Mood and Affect: Mood normal.        Behavior: Behavior normal.        Thought Content: Thought content normal.     ED Results / Procedures / Treatments   Labs (all labs ordered are listed, but only abnormal results are displayed) Labs Reviewed  COMPREHENSIVE METABOLIC PANEL - Abnormal; Notable for the following components:      Result Value   Glucose, Bld 135 (*)    AST 48 (*)    All other components within normal limits  TROPONIN I (HIGH SENSITIVITY) - Abnormal; Notable for the following components:   Troponin I (High Sensitivity) 3,797 (*)    All other components within normal limits  SARS CORONAVIRUS 2 BY RT PCR (HOSPITAL ORDER, [facility])  CBC  LIPASE, BLOOD  APTT  PROTIME-INR  HEPARIN LEVEL (UNFRACTIONATED)   TROPONIN I (HIGH SENSITIVITY)    EKG EKG Interpretation  Date/Time:  Thursday November 27 2020 08:12:35 EDT Ventricular Rate:  64 PR Interval:  144 QRS Duration: 104 QT Interval:  399 QTC Calculation: 412 R Axis:   -8 Text Interpretation: Sinus rhythm RSR' in V1 or V2, right VCD or RVH ST elevation, consider inferior injury 12 Lead; [place] Confirmed by Lacretia Leigh ([id number]) on 11/27/2020 9:36:31 AM   Radiology DG Chest 2 View  Result Date: 11/27/2020 CLINICAL DATA:  Bilateral anterior chest pain beneath the breast areas since last night. Smoker. EXAM: CHEST - 2 VIEW COMPARISON:  04/24/2015 FINDINGS: Normal sized heart. Lungs. Minimal peribronchial thickening. Stable mild scoliosis. IMPRESSION: Minimal bronchitic changes. Electronically Signed   By: Claudie Revering M.D.   On: 11/27/2020 09:00    Procedures .Critical Care Performed by: Corena Herter, PA-C Authorized by: Corena Herter, PA-C   Critical care provider statement:    Critical care time (minutes):  45   Critical care was necessary to treat or prevent imminent or life-threatening deterioration of the following conditions:  Cardiac failure   Critical care was time spent personally by me on the following activities:  Discussions with consultants, evaluation of patient's response to treatment, examination of patient, ordering and performing treatments and interventions, ordering and review of laboratory studies, ordering and review of radiographic studies, pulse oximetry, re-evaluation of patient's condition, obtaining history from patient or surrogate and review of old charts Comments:     Elevated troponins, ACS     Medications Ordered in ED Medications  nitroGLYCERIN 50 mg in dextrose 5 % 250 mL (0.2 mg/mL) infusion (5 mcg/min Intravenous New Bag/Given 11/27/20 0957)  heparin bolus via infusion 4,000 Units (has no administration in time range)  heparin ADULT infusion 100 units/mL (  25000 units/261mL) (has no  administration in time range)  alum & mag hydroxide-simeth (MAALOX/MYLANTA) 200-200-20 MG/5ML suspension 30 mL (30 mLs Oral Given 11/27/20 0927)  aspirin chewable tablet 324 mg (324 mg Oral Given 11/27/20 0947)    ED Course  I have reviewed the triage vital signs and the nursing notes.  Pertinent labs & imaging results that were available during my care of the patient were reviewed by me and considered in my medical decision making (see chart for details).  Clinical Course as of 11/27/20 1002  Thu Nov 27, 2020  1001 I spoke with Trish from cardiology given patient's elevated troponins to 3,800 and active chest pain.  She states that she will send somebody down right away. [GG]    Clinical Course User Index [GG] Corena Herter, PA-C   MDM Rules/Calculators/A&P                          ALGIE WESTRY was evaluated in Emergency Department on 11/27/2020 for the symptoms described in the history of present illness. He was evaluated in the context of the global COVID-19 pandemic, which necessitated consideration that the patient might be at risk for infection with the SARS-CoV-2 virus that causes COVID-19. Institutional protocols and algorithms that pertain to the evaluation of patients at risk for COVID-19 are in a state of rapid change based on information released by regulatory bodies including the CDC and federal and state organizations. These policies and algorithms were followed during the patient's care in the ED.  I personally reviewed patient's medical chart and all notes from triage and staff during today's encounter. I have also ordered and reviewed all labs and imaging that I felt to be medically necessary in the evaluation of this patient's complaints and with consideration of their physical exam. If needed, translation services were available and utilized.   Patient with nonspecific 7 out of 10 bilateral inframammary constant "aching" chest discomfort that he suspected to be reflux  disease.  We will treat with Maalox here in the ED while awaiting imaging and laboratory work-up.  Troponin results 3797.  Will repeat EKG, provide 324 mg chewable aspirin, started on nitroglycerin drip, started on heparin per pharmacy, and consult cardiology for admission.  I spoke with Trish from cardiology given patient's elevated troponins to 3,800 and active chest pain.  She states that she will send somebody down right away.   Final Clinical Impression(s) / ED Diagnoses Final diagnoses:  ACS (acute coronary syndrome) ([place])  Unstable angina pectoris [facility])    Rx / DC Orders ED Discharge Orders    None       Reita Chard 11/27/20 1002    Lacretia Leigh, MD 12/02/20 1931

## 2020-11-28 ENCOUNTER — Telehealth: Payer: Self-pay | Admitting: Physician Assistant

## 2020-11-28 ENCOUNTER — Observation Stay (HOSPITAL_BASED_OUTPATIENT_CLINIC_OR_DEPARTMENT_OTHER): Payer: Medicare Other

## 2020-11-28 DIAGNOSIS — I214 Non-ST elevation (NSTEMI) myocardial infarction: Secondary | ICD-10-CM

## 2020-11-28 DIAGNOSIS — I129 Hypertensive chronic kidney disease with stage 1 through stage 4 chronic kidney disease, or unspecified chronic kidney disease: Secondary | ICD-10-CM | POA: Diagnosis not present

## 2020-11-28 DIAGNOSIS — I249 Acute ischemic heart disease, unspecified: Secondary | ICD-10-CM

## 2020-11-28 DIAGNOSIS — Z20822 Contact with and (suspected) exposure to covid-19: Secondary | ICD-10-CM | POA: Diagnosis not present

## 2020-11-28 LAB — BASIC METABOLIC PANEL
Anion gap: 8 (ref 5–15)
BUN: 17 mg/dL (ref 8–23)
CO2: 25 mmol/L (ref 22–32)
Calcium: 8.7 mg/dL — ABNORMAL LOW (ref 8.9–10.3)
Chloride: 101 mmol/L (ref 98–111)
Creatinine, Ser: 1 mg/dL (ref 0.61–1.24)
GFR, Estimated: >60 mL/min (ref >60)
Glucose, Bld: 118 mg/dL — ABNORMAL HIGH (ref 70–99)
Potassium: 4 mmol/L (ref 3.5–5.1)
Sodium: 134 mmol/L — ABNORMAL LOW (ref 135–145)

## 2020-11-28 LAB — ECHOCARDIOGRAM COMPLETE
AR max vel: 2.68 cm2
AV Area VTI: 2.73 cm2
AV Area mean vel: 3.15 cm2
AV Mean grad: 8 mmHg
AV Peak grad: 16.6 mmHg
Ao pk vel: 2.04 m/s
Area-P 1/2: 3.6 cm2
Calc EF: 61.9 %
Height: 72 in
S' Lateral: 3.2 cm
Single Plane A2C EF: 71.7 %
Single Plane A4C EF: 53.6 %
Weight: 2592 oz

## 2020-11-28 LAB — CBC
HCT: 42.4 % (ref 39.0–52.0)
Hemoglobin: 14.4 g/dL (ref 13.0–17.0)
MCH: 32.4 pg (ref 26.0–34.0)
MCHC: 34 g/dL (ref 30.0–36.0)
MCV: 95.5 fL (ref 80.0–100.0)
Platelets: 187 10*3/uL (ref 150–400)
RBC: 4.44 MIL/uL (ref 4.22–5.81)
RDW: 14.2 % (ref 11.5–15.5)
WBC: 10.2 10*3/uL (ref 4.0–10.5)
nRBC: 0 % (ref 0.0–0.2)

## 2020-11-28 LAB — LIPID PANEL
Cholesterol: 138 mg/dL (ref 0–200)
HDL: 34 mg/dL — ABNORMAL LOW (ref >40)
LDL Cholesterol: 87 mg/dL (ref 0–99)
Total CHOL/HDL Ratio: 4.1 RATIO
Triglycerides: 87 mg/dL (ref <150)
VLDL: 17 mg/dL (ref 0–40)

## 2020-11-28 LAB — GLUCOSE, CAPILLARY
Glucose-Capillary: 147 mg/dL — ABNORMAL HIGH (ref 70–99)
Glucose-Capillary: 101 mg/dL — ABNORMAL HIGH (ref 70–99)

## 2020-11-28 MED ORDER — ISOSORBIDE MONONITRATE ER 30 MG PO TB24
15.0000 mg | ORAL_TABLET | Freq: Every day | ORAL | Status: DC
  Administered 2020-11-28: 15 mg via ORAL
  Filled 2020-11-28: qty 1

## 2020-11-28 MED ORDER — NITROGLYCERIN 0.4 MG SL SUBL: 0.4000 mg | SUBLINGUAL_TABLET | SUBLINGUAL | 12 refills | Status: DC | PRN

## 2020-11-28 MED ORDER — CLOPIDOGREL BISULFATE 75 MG PO TABS: 75.0000 mg | ORAL_TABLET | Freq: Every day | ORAL | 6 refills | Status: DC

## 2020-11-28 MED ORDER — ASPIRIN 81 MG PO TBEC: 81.0000 mg | DELAYED_RELEASE_TABLET | Freq: Every day | ORAL | 3 refills | Status: DC

## 2020-11-28 MED ORDER — ATORVASTATIN CALCIUM 80 MG PO TABS: 80.0000 mg | ORAL_TABLET | Freq: Every day | ORAL | 6 refills | Status: DC

## 2020-11-28 MED ORDER — ISOSORBIDE MONONITRATE ER 30 MG PO TB24: 15.0000 mg | ORAL_TABLET | Freq: Every day | ORAL | 6 refills | Status: DC

## 2020-11-28 NOTE — Telephone Encounter (Signed)
Pt is scheduled on 12/19/2020 @11 :15am with Almyra Deforest for TOC. Scheduled by Vin B

## 2020-11-28 NOTE — Discharge Instructions (Signed)
Angina  Angina is discomfort or pain in the chest, neck, arm, jaw, or back. The discomfort is caused by a lack of blood in the middle layer of the heart wall. The middle layer of the heart wall is called the myocardium. What are the causes? This condition is caused by a buildup of fat and cholesterol, or plaque, in your arteries. This buildup narrows the arteries and makes it hard for blood to flow. What increases the risk? Main risks  High levels of cholesterol in your blood.  High blood pressure.  Diabetes.  Family history of heart disease.  Not exercising or moving enough.  Depression.  Having had radiation treatment on the left side of your chest. Other risks  Tobacco use.  Too much body weight (obesity).  A diet that is high in unhealthy fats (saturated fats).  Stress.  Using drugs, such as cocaine. Risks for women  Being older than 55 years.  Being in menopause. This is the time when a woman no longer has a menstrual period. What are the signs or symptoms? Symptoms in all people  Chest pain, which may: ? Feel like a crushing or squeezing in the chest. ? Feel like a tightness, pressure, fullness, or heaviness in the chest. ? Last for more than a few minutes at a time. ? Stop and come back (recur) after a few minutes.  Pain in the neck, arm, jaw, or back.  Heartburn or upset stomach (indigestion) for no reason.  Being short of breath.  Feeling like you may vomit (nauseous).  Sudden cold sweats. Symptoms in women and people with diabetes  Tiredness.  Worry and anxiety.  Weakness.  Dizziness or fainting. How is this treated? This condition may be treated with:  Medicines. These can be given to prevent blood clots, stop a heart attack, lower blood pressure, or treat other risk factors.  A procedure to widen a narrowed or blocked artery in the heart.  Surgery to allow blood to go around a blocked artery. Follow these instructions at  home: Medicines  Take over-the-counter and prescription medicines only as told by your doctor.  Do not take these medicines unless your doctor says that you can: ? NSAIDs. These include:  Ibuprofen.  Naproxen. ? Vitamin supplements that have vitamin A, vitamin E, or both. ? Hormone therapy that contains estrogen with or without progestin. Eating and drinking  Eat a heart-healthy diet that includes: ? Lots of fresh fruits and vegetables. ? Whole grains. ? Low-fat (lean) protein. ? Low-fat dairy products.  Follow instructions from your doctor about what you cannot eat or drink.   Activity  Follow an exercise program that your doctor tells you.  Talk with your doctor about joining a program to make your heart strong again (cardiac rehab).  When you feel tired, take a break. Plan breaks if you know you are going to feel tired. Lifestyle  Do not smoke or use any products that contain nicotine or tobacco. If you need help quitting, ask your doctor.  If your doctor says you can drink alcohol: ? Limit how much you have to:  0-1 drink a day for women who are not pregnant.  0-2 drinks a day for men. ? Know how much alcohol is in your drink. In the [place] one drink equals one 12 oz bottle of beer (355 mL), one 5 oz glass of wine (148 mL), or one 1 oz glass of hard liquor (44 mL).   General instructions  Stay at  a healthy weight. If told to lose weight, work with your doctor to do lose weight safely.  Learn to manage stress. If you need help, ask your doctor.  Keep your vaccines up to date. Get a flu shot every year.  Talk with your doctor if you feel sad. Take a screening test to see if you are at risk for depression.  Work with your doctor to manage any other health problems that you have. These may include diabetes or high blood pressure.  Keep all follow-up visits. Get help right away if:  You have pain in your chest, neck, arm, jaw, or back, and the pain: ? Lasts more  than a few minutes. ? Comes back. ? Does not get better after you take medicine under your tongue (sublingual nitroglycerin). ? Keeps getting worse. ? Comes more often.  You have any of these problems: ? Sweating a lot. ? Heartburn or upset stomach. ? Shortness of breath. ? Trouble breathing. ? Feeling like you may vomit. ? Vomiting. ? Feeling more tired than normal. ? Feeling nervous or worrying more than normal. ? Weakness.  You are dizzy or light-headed all of a sudden.  You faint. These symptoms may be an emergency. Get help right away. Call your local emergency services (911 in the [place]  Do not wait to see if the symptoms will go away.  Do not drive yourself to the hospital. Summary  Angina is discomfort or pain in the chest, neck, arm, neck, or back.  Symptoms include chest pain, heartburn or upset stomach, and shortness of breath.  Women or people with diabetes may have other symptoms, such as feeling nervous, being worried, or being weak or tired.  Take all medicines only as told by your doctor.  You should eat a heart-healthy diet and follow an exercise program. This information is not intended to replace advice given to you by your health care provider. Make sure you discuss any questions you have with your health care provider. Document Revised: 01/18/2020 Document Reviewed: 01/18/2020 Elsevier Patient Education  2021 [employer].

## 2020-11-28 NOTE — Care Management Obs Status (Signed)
MEDICARE OBSERVATION STATUS NOTIFICATION   Patient Details  Name: MANU RUBEY MRN: [id number] Date of Birth: 06/25/1956   Medicare Observation Status Notification Given:  Yes    Zenon Mayo, RN 11/28/2020, 12:19 PM

## 2020-11-28 NOTE — Discharge Summary (Addendum)
Discharge Summary    Patient ID: Keith [name] MRN: [id number]; DOB: 1956/05/25  Admit date: 11/27/2020 Discharge date: 11/28/2020  PCP:  Binnie Rail, MD   [place]  Cardiologist:  Keith Munroe, MD    Discharge Diagnoses    Active Problems:   ACS (acute coronary syndrome) [facility])   NSTEMI (non-ST elevated myocardial infarction) ([place])  HTN  HLD Tobacco abuse   Diagnostic Studies/Procedures   Echo Pending final read. Preliminary review by Dr. Cloys Vera>> normal LVEF without WM abnormality.   LEFT HEART CATH AND CORONARY ANGIOGRAPHY    Conclusion    1st Diag lesion is 90% stenosed.  2nd Diag lesion is 90% stenosed.  Mid LAD lesion is 40% stenosed.  Ost Cx to Prox Cx lesion is 20% stenosed.  The left ventricular systolic function is normal.  LV end diastolic pressure is normal.  The left ventricular ejection fraction is 55-65% by visual estimate.  IMPRESSION:Keith [name] has nonobstructive CAD. He does have ostial diagonal branch disease and is very small D1 and a slightly larger D2. He has moderate although nonobstructive appearing mid LAD disease. His LV function is normal without focal wall motion abnormalities. Medical therapy will be recommended with aspirin and Plavix as well as beta-blocker and high-dose statin therapy. The sheath was removed and a TR band was placed on the right wrist to achieve patent hemostasis. The patient left lab in stable condition.  Diagnostic Dominance: Right          History of Present Illness     Keith [name] is a 65 y.o. male with hx of DM-2, COPD, + tobacco, HLD and HTNno prior cardiac issues presents with 12 hours of chest pressure across chest who found to have elevated troponin.   Patient developed chest pain across chest beginning at 8:30 pm 4/20 while sitting.  No SOB, though feels he is breathing more shallow, no pain with deep breath.  No diaphoresis or no  nausea at one point radiation to Rt jaw but that resolved.  Due to worsen pain in the morning he came to ER. Troponin elevated. EKG with SR initially artifact and appeared to have mild ST elevation inf. He continued to have ongoing pain despite IV nitro and IV heparin in ER.   He smokes 1.5 to 2 pk per day since age 46, though did stop for 5 years but resumed.   ETOH beer usually up to 6 pk per day, other days 1-2.   Hospital Course     Consultants: None  1.NSTEMI - [place]onin [place] at [address]>> not trended. Treated with heparin and taken to cath lab showing  non obstructive CAD. He does have ostial diagonal branch disease and is very small D1 and a slightly larger D2>> not PCI amenable. Normal LVEDP by cath. Treated with ASA and Plavix. No post procedural complications. Already on BB. Added low dose imdur. No pleuritic features.  Pending reading of echocardiogram, priliminary review by Dr. Margaretann Loveless showed normal LV function.  Ambulated well.   2. HTN - BP stable on multi regimen (home)>> Continued   3. HLD - 11/28/2020: Cholesterol 138; HDL 34; LDL Cholesterol 87; Triglycerides 87; VLDL 17  - LDL goal less than 70 - Lipitor increased to 80mg  qd - Lipid panel and LFTS in 8 weeks   4. Tobacco and alcohol abuse - Recommended cessation   The patient been seen by Dr. Margaretann Loveless  today and deemed ready for discharge home. All follow-up appointments  have been scheduled. Discharge medications are listed below.   Did the patient have an acute coronary syndrome (MI, NSTEMI, STEMI, etc) this admission?:  Yes                               AHA/ACC Clinical Performance & Quality Measures: 1. Aspirin prescribed? - Yes 2. ADP Receptor Inhibitor (Plavix/Clopidogrel, Brilinta/Ticagrelor or Effient/Prasugrel) prescribed (includes medically managed patients)? - Yes 3. Beta Blocker prescribed? - Yes 4. High Intensity Statin (Lipitor 40-80mg  or Crestor 20-40mg ) prescribed? - Yes 5. EF assessed during THIS  hospitalization? - Yes 6. For EF <40%, was ACEI/ARB prescribed? - Not Applicable (EF >/= [id number]) 7. For EF <40%, Aldosterone Antagonist (Spironolactone or Eplerenone) prescribed? - Not Applicable (EF >/= [id number]) 8. Cardiac Rehab Phase II ordered (including medically managed patients)? - No - no PCI    Discharge Vitals Blood pressure 130/76, pulse 72, temperature 98.7 F (37.1 C), temperature source Oral, resp. rate 17, height 6' (1.829 m), weight 73.5 kg, SpO2 98 %.  Filed Weights   11/27/20 0811  Weight: 73.5 kg    Labs & Radiologic Studies    CBC Recent Labs    11/27/20 0817 11/28/20 0159  WBC 6.8 10.2  HGB 16.0 14.4  HCT 48.1 42.4  MCV 96.4 95.5  PLT 217 [id number]   Basic Metabolic Panel Recent Labs    11/27/20 0817 11/27/20 1526 11/28/20 0159  NA 137  --  134*  K 4.1  --  4.0  CL 101  --  101  CO2 26  --  25  GLUCOSE 135*  --  118*  BUN 14  --  17  CREATININE 0.93  --  1.00  CALCIUM 9.4  --  8.7*  MG  --  2.2  --   PHOS 3.2  --   --    Liver Function Tests Recent Labs    11/27/20 0817  AST 48*  ALT 17  ALKPHOS 76  BILITOT 0.6  PROT 7.1  ALBUMIN 3.7   Recent Labs    11/27/20 0817  LIPASE 29   High Sensitivity Troponin:   Recent Labs  Lab 11/27/20 0817 11/27/20 1001 11/27/20 1530  TROPONINIHS 3,797* 5,708* 12,902*    Hemoglobin A1C Recent Labs    11/27/20 1526  HGBA1C 6.1*   Fasting Lipid Panel Recent Labs    11/28/20 0159  CHOL 138  HDL 34*  LDLCALC 87  TRIG 87  CHOLHDL 4.1   Thyroid Function Tests Recent Labs    11/27/20 1526  TSH 1.047   _____________  DG Chest 2 View  Result Date: 11/27/2020 CLINICAL DATA:  Bilateral anterior chest pain beneath the breast areas since last night. Smoker. EXAM: CHEST - 2 VIEW COMPARISON:  04/24/2015 FINDINGS: Normal sized heart. Lungs. Minimal peribronchial thickening. Stable mild scoliosis. IMPRESSION: Minimal bronchitic changes. Electronically Signed   By: Claudie Revering M.D.   On: 11/27/2020 09:00    CARDIAC CATHETERIZATION  Result Date: 11/27/2020  1st Diag lesion is 90% stenosed.  2nd Diag lesion is 90% stenosed.  Mid LAD lesion is 40% stenosed.  Ost Cx to Prox Cx lesion is 20% stenosed.  The left ventricular systolic function is normal.  LV end diastolic pressure is normal.  The left ventricular ejection fraction is 55-65% by visual estimate.  Keith [name] is a 65 y.o. male  [id number] LOCATION:  FACILITY: [place] PHYSICIAN: Quay Burow, M.D. [birth year] DATE OF PROCEDURE:  11/27/2020 DATE OF DISCHARGE: CARDIAC CATHETERIZATION History obtained from chart review.  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  His risk factors include tobacco abuse smoking 1-1/2 packs a day for many years, treated hypertension and hyperlipidemia, untreated diabetes who developed  substernal chest burning and approximately 830 last night.  The pain persisted.  He was seen in [facility] emergency room where his EKG showed no acute changes but his troponins were positive at approximately 5000.  He was heparinized and put on IV nitroglycerin, transported to [facility] for diagnostic coronary angiography.   Keith [name] has nonobstructive CAD.  He does have ostial diagonal branch disease and is very small D1 and a slightly larger D2.  He has moderate although nonobstructive appearing mid LAD disease.  His LV function is normal without focal wall motion abnormalities.  Medical therapy will be recommended with aspirin and Plavix as well as beta-blocker and high-dose statin therapy.  The sheath was removed and a TR band was placed on the right wrist to achieve patent hemostasis.  The patient left lab in stable condition. Quay Burow. MD, [facility] 11/27/2020 1:36 PM   Disposition   Pt is being discharged home today in good condition.  Follow-up Plans & Appointments     Follow-up Information    Keith [name], [place]. Go on 12/19/2020.   Specialties:  Cardiology, Radiology Why: @11 :15am for hospital follow up with Dr. Delphina Cahill PA. Please arrive 15 minutes early  Contact information: [address] [place] [address] [phone]              Discharge Instructions    Diet - low sodium heart healthy   Complete by: As directed    Discharge instructions   Complete by: As directed    NO HEAVY LIFTING (>10lbs) X 2 WEEKS. NO SEXUAL ACTIVITY X 2 WEEKS. NO DRIVING X 1 WEEK.   Increase activity slowly   Complete by: As directed      Discharge Medications   Allergies as of 11/28/2020      Reactions   Bupropion Hcl    REACTION: rash @ pressure areas (waist, groin , axillae)   Penicillins    ? Rash , hives   Amoxicillin Itching, Rash      Medication List    TAKE these medications   aspirin 81 MG EC tablet Take 1 tablet (81 mg total) by mouth daily. Swallow whole. Start taking on: November 29, 2020   atorvastatin 80 MG tablet Commonly known as: LIPITOR Take 1 tablet (80 mg total) by mouth daily. Start taking on: November 29, 2020 What changed:   medication strength  how much to take   cloNIDine 0.2 MG tablet Commonly known as: CATAPRES Take 1 tablet (0.2 mg total) by mouth 2 (two) times daily.   clopidogrel 75 MG tablet Commonly known as: PLAVIX Take 1 tablet (75 mg total) by mouth daily with breakfast. Start taking on: November 29, 2020   hydrALAZINE 50 MG tablet Commonly known as: APRESOLINE Take 2 tablets (100 mg total) by mouth 2 (two) times daily.   isosorbide mononitrate 30 MG 24 hr tablet Commonly known as: IMDUR Take 0.5 tablets (15 mg total) by mouth daily. Start taking on: November 29, 2020   losartan-hydrochlorothiazide 100-25 MG tablet Commonly known as: HYZAAR Take 1 tablet by mouth daily.   metoprolol tartrate 25 MG tablet Commonly known as: LOPRESSOR Take 1 tablet (25 mg total) by mouth  2 (two) times daily.   neomycin-bacitracin-polymyxin ointment Commonly known as:  NEOSPORIN Apply 1 application topically as needed for wound care.   nitroGLYCERIN 0.4 MG SL tablet Commonly known as: NITROSTAT Place 1 tablet (0.4 mg total) under the tongue every 5 (five) minutes x 3 doses as needed for chest pain.   traMADol 50 MG tablet Commonly known as: ULTRAM TAKE 1 TABLET BY MOUTH EVERY 12 HOURS AS NEEDED What changed: reasons to take this          Outstanding Labs/Studies   Lipid panel and LFTs in 8 weeks   Duration of Discharge Encounter   Greater than 30 minutes including physician time.  Keith [name] Keith [name], [place] 11/28/2020, 1:29 PM   Patient seen and examined with [facility] PA.  Agree as above, with the following exceptions and changes as noted below. He is feeling better on current medical therapy. We discussed likely plaque rupture vs possible myocarditis. If chest pain recurs, would consider cardiac MRI as outpatient. Gen: NAD, CV: RRR, no murmurs, Lungs: clear, Abd: soft, Extrem: Warm, well perfused, no edema, Neuro/Psych: alert and oriented x 3, normal mood and affect. All available labs, radiology testing, previous records reviewed. Patient would like to go home today, recommend medical management of nstemi with ASA and plavix for 1 year. Titrate medical therapy as above.   Keith Munroe, MD

## 2020-11-28 NOTE — Telephone Encounter (Signed)
Patient is still currently admitted. 

## 2020-11-28 NOTE — Progress Notes (Signed)
*  PRELIMINARY RESULTS* Echocardiogram 2D Echocardiogram has been performed.  Luisa Hart 11/28/2020, 9:35 AM

## 2020-11-28 NOTE — Progress Notes (Addendum)
Progress Note  Patient Name: Keith [name] Date of Encounter: 11/28/2020  [facility] Cardiologist: Elouise Munroe, MD   Subjective   Feeling well. No chest pain, sob or palpitations.   Inpatient Medications    Scheduled Meds: . aspirin EC  81 mg Oral Daily  . atorvastatin  80 mg Oral Daily  . cloNIDine  0.2 mg Oral BID  . clopidogrel  75 mg Oral Q breakfast  . feeding supplement  237 mL Oral BID BM  . folic acid  1 mg Oral Daily  . hydrALAZINE  100 mg Oral BID  . insulin aspart  0-6 Units Subcutaneous TID WC  . LORazepam  0-4 mg Oral Q6H   Followed by  . [START ON 11/29/2020] LORazepam  0-4 mg Oral Q12H  . metoprolol tartrate  25 mg Oral BID  . multivitamin with minerals  1 tablet Oral Daily  . sodium chloride flush  3 mL Intravenous Q12H  . thiamine  100 mg Oral Daily   Or  . thiamine  100 mg Intravenous Daily   Continuous Infusions: . sodium chloride    . sodium chloride     PRN Meds: sodium chloride, acetaminophen, LORazepam **OR** LORazepam, morphine injection, nitroGLYCERIN, ondansetron (ZOFRAN) IV, sodium chloride flush, zolpidem   Vital Signs    Vitals:   11/28/20 0041 11/28/20 0433 11/28/20 0812 11/28/20 0933  BP: (!) 107/58 119/70 130/76   Pulse: (!) 50 (!) 55 (!) 55 72  Resp: 16 18 17    Temp: 97.8 F (36.6 C) 97.8 F (36.6 C) 98.7 F (37.1 C)   TempSrc: Oral Oral Oral   SpO2: 96% 98% 98%   Weight:      Height:        Intake/Output Summary (Last 24 hours) at 11/28/2020 1108 Last data filed at 11/27/2020 2015 Gross per 24 hour  Intake 636.48 ml  Output 400 ml  Net 236.48 ml   Last 3 Weights 11/27/2020 07/14/2020 01/14/2020  Weight (lbs) 162 lb 165 lb 12.8 oz 172 lb 6.4 oz  Weight (kg) 73.483 kg 75.206 kg 78.2 kg      Telemetry    Sinus brady/rhythm 50-60s - Personally Reviewed  ECG    NSR - Personally Reviewed  Physical Exam   GEN: No acute distress.   Neck: No JVD Cardiac: RRR, no murmurs, rubs, or gallops. Right radial  cath site without hematoma  Respiratory: Clear to auscultation bilaterally. GI: Soft, nontender, non-distended  MS: No edema; No deformity. Neuro:  Nonfocal  Psych: Normal affect   Labs    High Sensitivity Troponin:   Recent Labs  Lab 11/27/20 0817 11/27/20 1001 11/27/20 1530  TROPONINIHS 3,797* 5,708* 12,902*      Chemistry Recent Labs  Lab 11/27/20 0817 11/28/20 0159  NA 137 134*  K 4.1 4.0  CL 101 101  CO2 26 25  GLUCOSE 135* 118*  BUN 14 17  CREATININE 0.93 1.00  CALCIUM 9.4 8.7*  PROT 7.1  --   ALBUMIN 3.7  --   AST 48*  --   ALT 17  --   ALKPHOS 76  --   BILITOT 0.6  --   GFRNONAA >60 >60  ANIONGAP 10 8     Hematology Recent Labs  Lab 11/27/20 0817 11/28/20 0159  WBC 6.8 10.2  RBC 4.99 4.44  HGB 16.0 14.4  HCT 48.1 42.4  MCV 96.4 95.5  MCH 32.1 32.4  MCHC 33.3 34.0  RDW 14.4 14.2  PLT 217  187    Radiology    DG Chest 2 View  Result Date: 11/27/2020 CLINICAL DATA:  Bilateral anterior chest pain beneath the breast areas since last night. Smoker. EXAM: CHEST - 2 VIEW COMPARISON:  04/24/2015 FINDINGS: Normal sized heart. Lungs. Minimal peribronchial thickening. Stable mild scoliosis. IMPRESSION: Minimal bronchitic changes. Electronically Signed   By: Claudie Revering M.D.   On: 11/27/2020 09:00   CARDIAC CATHETERIZATION  Result Date: 11/27/2020  1st Diag lesion is 90% stenosed.  2nd Diag lesion is 90% stenosed.  Mid LAD lesion is 40% stenosed.  Ost Cx to Prox Cx lesion is 20% stenosed.  The left ventricular systolic function is normal.  LV end diastolic pressure is normal.  The left ventricular ejection fraction is 55-65% by visual estimate.  Keith [name] is a 65 y.o. male  [id number] LOCATION:  FACILITY: [place] PHYSICIAN: Quay Burow, M.D. [birth year] DATE OF PROCEDURE:  11/27/2020 DATE OF DISCHARGE: CARDIAC CATHETERIZATION History obtained from chart review.  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  His risk factors include tobacco abuse smoking 1-1/2 packs a day for many years, treated hypertension and hyperlipidemia, untreated diabetes who developed  substernal chest burning and approximately 830 last night.  The pain persisted.  He was seen in [facility] emergency room where his EKG showed no acute changes but his troponins were positive at approximately 5000.  He was heparinized and put on IV nitroglycerin, transported to [facility] for diagnostic coronary angiography.   Keith [name] has nonobstructive CAD.  He does have ostial diagonal branch disease and is very small D1 and a slightly larger D2.  He has moderate although nonobstructive appearing mid LAD disease.  His LV function is normal without focal wall motion abnormalities.  Medical therapy will be recommended with aspirin and Plavix as well as beta-blocker and high-dose statin therapy.  The sheath was removed and a TR band was placed on the right wrist to achieve patent hemostasis.  The patient left lab in stable condition. Quay Burow. MD, [facility] 11/27/2020 1:36 PM    Cardiac Studies   LEFT HEART CATH AND CORONARY ANGIOGRAPHY    Conclusion    1st Diag lesion is 90% stenosed.  2nd Diag lesion is 90% stenosed.  Mid LAD lesion is 40% stenosed.  Ost Cx to Prox Cx lesion is 20% stenosed.  The left ventricular systolic function is normal.  LV end diastolic pressure is normal.  The left ventricular ejection fraction is 55-65% by visual estimate.  IMPRESSION: Keith [name] has nonobstructive CAD.  He does have ostial diagonal branch disease and is very small D1 and a slightly larger D2.  He has moderate although nonobstructive appearing mid LAD disease.  His LV function is normal without focal wall motion abnormalities.  Medical therapy will be recommended with aspirin and Plavix as well as beta-blocker and high-dose statin therapy.  The sheath was removed and a TR band was placed on the right wrist  to achieve patent hemostasis.  The patient left lab in stable condition.  Diagnostic Dominance: Right       Patient Profile     65 y.o. male with DM-2, COPD, + tobacco, HLD and HTN no prior cardiac issues presents with 12 hours of chest pressure across chest who found to have elevated troponin.   Assessment & Plan    1.NSTEMI - H[place]-troponin peaked at 12902>> not trended.  Cath as above showing non obstructive CAD. He  does have ostial diagonal branch disease and is very small D1 and a slightly larger D2>> not PCI amenable. Normal LVEDP by cath.  -Pending echo  - Will ambulated - Likely Dc later today  - Add Imdur 15mg  qd - Continue ASA and Plavix   2. HTN - BP stable on multi regimen  3. HLD - 11/28/2020: Cholesterol 138; HDL 34; LDL Cholesterol 87; Triglycerides 87; VLDL 17  - LDL goal less than 70 - Lipitor increased to 80mg  qd   For questions or updates, please contact [place] Please consult [email or web address] for contact info under        Jarrett Soho, PA  11/28/2020, 11:08 AM    Patient seen and examined with [facility] PA.  Agree as above, with the following exceptions and changes as noted below. He is feeling better on current medical therapy. We discussed likely plaque rupture vs possible myocarditis. If chest pain recurs, would consider cardiac MRI as outpatient. Gen: NAD, CV: RRR, no murmurs, Lungs: clear, Abd: soft, Extrem: Warm, well perfused, no edema, Neuro/Psych: alert and oriented x 3, normal mood and affect. All available labs, radiology testing, previous records reviewed. Patient would like to go home today, recommend medical management of nstemi with ASA and plavix for 1 year. Titrate medical therapy as above.  Elouise Munroe, MD

## 2020-11-28 NOTE — Care Management CC44 (Signed)
Condition Code 44 Documentation Completed  Patient Details  Name: DEMARION PONDEXTER MRN: [id number] Date of Birth: 12-Oct-1955   Condition Code 44 given:  Yes Patient signature on Condition Code 44 notice:  Yes Documentation of 2 MD's agreement:  Yes Code 44 added to claim:  Yes    Zenon Mayo, RN 11/28/2020, 12:19 PM

## 2020-12-01 ENCOUNTER — Telehealth: Payer: Self-pay | Admitting: Internal Medicine

## 2020-12-01 MED ORDER — TRAMADOL HCL 50 MG PO TABS: 50.0000 mg | ORAL_TABLET | Freq: Two times a day (BID) | ORAL | 0 refills | Status: DC | PRN

## 2020-12-01 NOTE — Telephone Encounter (Signed)
Spoke with patient today. 

## 2020-12-01 NOTE — Telephone Encounter (Signed)
Medication approved.  Dr. Ronnald Ramp works in our office and refilled his prescription last month because of the refill was sent to him he denied it.

## 2020-12-01 NOTE — Telephone Encounter (Signed)
Patient contacted regarding discharge from [place] on 11/28/20.  Patient understands to follow up with Almyra Deforest PA  on 12/19/20 at [facility] office. Patient understands discharge instructions. Patient understands medications and regiment. Patient understands to bring all medications to this visit.

## 2020-12-01 NOTE — Addendum Note (Signed)
Addended by: Binnie Rail on: 12/01/2020 04:30 PM   Modules accepted: Orders

## 2020-12-01 NOTE — Telephone Encounter (Signed)
   Patient calling to discuss refill of Tramadol, no medication remaining Patient states he has never seen Dr Ronnald Ramp. He states the Tramadol help his lower back pain Patient recently had cardiac event, discharged from hospital Declined appointment at this time because he is unable to drive   Please advise

## 2020-12-03 ENCOUNTER — Encounter: Payer: Self-pay | Admitting: Internal Medicine

## 2020-12-03 DIAGNOSIS — I2510 Atherosclerotic heart disease of native coronary artery without angina pectoris: Secondary | ICD-10-CM | POA: Insufficient documentation

## 2020-12-19 ENCOUNTER — Other Ambulatory Visit: Payer: Self-pay

## 2020-12-19 ENCOUNTER — Encounter: Payer: Self-pay | Admitting: Physician Assistant

## 2020-12-19 ENCOUNTER — Ambulatory Visit (INDEPENDENT_AMBULATORY_CARE_PROVIDER_SITE_OTHER): Payer: Medicare Other | Admitting: Physician Assistant

## 2020-12-19 VITALS — BP 158/90 | HR 83 | Ht 72.0 in | Wt 173.4 lb

## 2020-12-19 DIAGNOSIS — E785 Hyperlipidemia, unspecified: Secondary | ICD-10-CM | POA: Diagnosis not present

## 2020-12-19 DIAGNOSIS — I10 Essential (primary) hypertension: Secondary | ICD-10-CM | POA: Diagnosis not present

## 2020-12-19 DIAGNOSIS — I200 Unstable angina: Secondary | ICD-10-CM

## 2020-12-19 DIAGNOSIS — I2510 Atherosclerotic heart disease of native coronary artery without angina pectoris: Secondary | ICD-10-CM | POA: Diagnosis not present

## 2020-12-19 DIAGNOSIS — Z720 Tobacco use: Secondary | ICD-10-CM

## 2020-12-19 DIAGNOSIS — E119 Type 2 diabetes mellitus without complications: Secondary | ICD-10-CM

## 2020-12-19 DIAGNOSIS — J449 Chronic obstructive pulmonary disease, unspecified: Secondary | ICD-10-CM

## 2020-12-19 MED ORDER — ISOSORBIDE MONONITRATE ER 30 MG PO TB24: 30.0000 mg | ORAL_TABLET | Freq: Every day | ORAL | 3 refills | Status: DC

## 2020-12-19 MED ORDER — CHANTIX STARTING MONTH PAK 0.5 MG X 11 & 1 MG X 42 PO TABS: ORAL_TABLET | ORAL | 0 refills | Status: DC

## 2020-12-19 NOTE — Patient Instructions (Addendum)
Medication Instructions:   START Chantix-Take one 0.5 mg tablet by mouth once daily for 3 days, then increase to one 0.5 mg tablet twice daily for 4 days, then increase to one 1 mg tablet twice daily.  *If you need a refill on your cardiac medications before your next appointment, please call your pharmacy*  Lab Work: Your physician recommends that you return for lab work in 2 months-02/18/21:   Fasting Lipid Panel-DO NOT EAT OR DRINK PAST MIDNIGHT. OKAY TO HAVE WATER.  Hepatic (Liver) Function Test  If you have labs (blood work) drawn today and your tests are completely normal, you will receive your results only by: Marland Kitchen MyChart Message (if you have MyChart) OR . A paper copy in the mail If you have any lab test that is abnormal or we need to change your treatment, we will call you to review the results.  Testing/Procedures: NONE ordered at this time of appointment   Follow-Up: At [facility], you and your health needs are our priority.  As part of our continuing mission to provide you with exceptional heart care, we have created designated Provider Care Teams.  These Care Teams include your primary Cardiologist (physician) and Advanced Practice Providers (APPs -  Physician Assistants and Nurse Practitioners) who all work together to provide you with the care you need, when you need it.  We recommend signing up for the patient portal called "MyChart".  Sign up information is provided on this After Visit Summary.  MyChart is used to connect with patients for Virtual Visits (Telemedicine).  Patients are able to view lab/test results, encounter notes, upcoming appointments, etc.  Non-urgent messages can be sent to your provider as well.   To learn more about what you can do with MyChart, go to [email or web address].    Your next appointment:   3 month(s)  The format for your next appointment:   In Person  Provider:   Cherlynn Kaiser, MD  Other Instructions

## 2020-12-19 NOTE — Progress Notes (Signed)
Cardiology Office Note:    Date:  12/21/2020   ID:  Keith [name], DOB 09/20/1955, MRN [id number]  PCP:  Pincus Sanes, MD   [facility] Providers Cardiologist:  Parke Poisson, MD {  Referring MD: Pincus Sanes, MD   Chief Complaint  Patient presents with  . Follow-up    Seen for Dr. Jacques Navy    History of Present Illness:    Keith [name] is a 65 y.o. male with a hx of hypertension, hyperlipidemia, DM 2, COPD and tobacco abuse who was recently admitted to the hospital on 11/27/2020 with chest pain.  He was ruled in for NSTEMI.  Cardiac catheterization performed on 11/27/2020 revealed a 90% D1, 90% D2, 40% mid LAD, 20% ostial left circumflex lesion, EF 55 to 65%.  The D1 artery was very small and D2 artery was only slightly larger, medical therapy was recommended.  He was placed on aspirin, Plavix, beta-blocker and high-dose statin therapy.  Echocardiogram obtained on 11/29/2020 showed EF 35 to 60%, grade 1 DD, RVSP 40.9 mmHg.  Serial troponin trended up to 12,902.  Low-dose Imdur was added due to persistent chest pain.  Patient presents today for follow-up.  He denies any further chest discomfort.  Instead of half a tablet of Imdur, he has been taking a full tablet daily.  He denies any significant dizziness or feeling of passing out.  I will leave him on full tablet of Imdur starting now.  His blood pressure is elevated today, however did he did not take his blood pressure medication this morning.  We emphasized on the compliance of aspirin and Plavix and discontinuation of tobacco.  He says he has cut back, however he did purchase another carton of cigarettes yesterday.  I strongly advised him to stop smoking.  He will need a fasting lipid panel and LFT in 6 to 8 weeks and follow-up with Dr. Jacques Navy in 3 months.   Past Medical History:  Diagnosis Date  . Anxiety   . Cataract    beginnings   . Chronic kidney disease 04/23/15   ARF- "resolved"  . Colon polyp   . COPD  (chronic obstructive pulmonary disease) (HCC)    per pt from smoking   . Dehydration 04/2015   Hospitalized for 4 days after abnormal labs found during preop appointment.  . Depression   . Diabetes mellitus without complication (HCC)    A1C 04/24/15- diet controlled   . Diverticulosis   . Hyperlipidemia   . Hypertension   . Tobacco abuse     Past Surgical History:  Procedure Laterality Date  . COLONOSCOPY    . colonoscopy with polypectomy     polyp X 1  . 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 LEFT HEART CATH AND CORONARY ANGIOGRAPHY N/A 11/27/2020   Procedure: LEFT HEART CATH AND CORONARY ANGIOGRAPHY;  Surgeon: Runell Gess, MD;  Location: [facility] INVASIVE CV LAB;  Service: Cardiovascular;  Laterality: N/A;  . MAXIMUM ACCESS (MAS)POSTERIOR LUMBAR INTERBODY FUSION (PLIF) 2 LEVEL N/A 05/14/2015   Procedure: Lumbar three-lumbar four,  umbar four-lumbar five Maximum access posterior lumbar interbody fusion;  Surgeon: Tia Alert, MD;  Location: [facility] NEURO ORS;  Service: Neurosurgery;  Laterality: N/A;  L3-4 L4-5 Maximum access posterior lumbar interbody fusion  . POLYPECTOMY    . WISDOM TOOTH EXTRACTION      Current Medications: Current Meds  Medication Sig  . aspirin EC 81 MG  EC tablet Take 1 tablet (81 mg total) by mouth daily. Swallow whole.  Marland Kitchen atorvastatin (LIPITOR) 80 MG tablet Take 1 tablet (80 mg total) by mouth daily.  . cloNIDine (CATAPRES) 0.2 MG tablet Take 1 tablet (0.2 mg total) by mouth 2 (two) times daily.  . clopidogrel (PLAVIX) 75 MG tablet Take 1 tablet (75 mg total) by mouth daily with breakfast.  . hydrALAZINE (APRESOLINE) 50 MG tablet Take 2 tablets (100 mg total) by mouth 2 (two) times daily.  Marland Kitchen losartan-hydrochlorothiazide (HYZAAR) 100-25 MG tablet Take 1 tablet by mouth daily.  . metoprolol tartrate (LOPRESSOR) 25 MG tablet Take 1 tablet (25 mg total) by mouth 2 (two) times daily.  Marland Kitchen neomycin-bacitracin-polymyxin (NEOSPORIN) ointment Apply  1 application topically as needed for wound care.  . nitroGLYCERIN (NITROSTAT) 0.4 MG SL tablet Place 1 tablet (0.4 mg total) under the tongue every 5 (five) minutes x 3 doses as needed for chest pain.  . traMADol (ULTRAM) 50 MG tablet Take 1 tablet (50 mg total) by mouth every 12 (twelve) hours as needed for moderate pain.  . varenicline (CHANTIX STARTING MONTH PAK) 0.5 MG X 11 & 1 MG X 42 tablet Take one 0.5 mg tablet by mouth once daily for 3 days, then increase to one 0.5 mg tablet twice daily for 4 days, then increase to one 1 mg tablet twice daily.  . [DISCONTINUED] isosorbide mononitrate (IMDUR) 30 MG 24 hr tablet Take 30 mg by mouth daily.  . [DISCONTINUED] losartan-hydrochlorothiazide (HYZAAR) 100-25 MG tablet Take 1 tablet by mouth daily.     Allergies:   Bupropion hcl, Penicillins, and Amoxicillin   Social History   Socioeconomic History  . Marital status: Married    Spouse name: Not on file  . Number of children: Not on file  . Years of education: Not on file  . Highest education level: Not on file  Occupational History  . Not on file  Tobacco Use  . Smoking status: Current Every Day Smoker    Packs/day: 1.50    Years: 40.00    Pack years: 60.00    Types: Cigarettes    Last attempt to quit: 04/19/2015    Years since quitting: 5.6  . Smokeless tobacco: Never Used  Vaping Use  . Vaping Use: Never used  Substance and Sexual Activity  . Alcohol use: Yes    Alcohol/week: 3.0 standard drinks    Types: 3 Cans of beer per week    Comment: daily drinks- 12 pack on the weekends case/weekend,   . Drug use: No  . Sexual activity: Not on file  Other Topics Concern  . Not on file  Social History Narrative  . Not on file   Social Determinants of Health   Financial Resource Strain: Not on file  Food Insecurity: Not on file  Transportation Needs: Not on file  Physical Activity: Not on file  Stress: Not on file  Social Connections: Not on file     Family History: The  patient's family history includes Alcohol abuse in his father; Breast cancer in his sister; Diabetes in his brother and mother; Melanoma in his father; Stomach cancer in his father; Stroke in his father. There is no history of Colon cancer, Esophageal cancer, Rectal cancer, or Colon polyps.  ROS:   Please see the history of present illness.     All other systems reviewed and are negative.  EKGs/Labs/Other Studies Reviewed:    The following studies were reviewed today:  Cath 11/27/2020  1st Diag lesion is 90% stenosed.  2nd Diag lesion is 90% stenosed.  Mid LAD lesion is 40% stenosed.  Ost Cx to Prox Cx lesion is 20% stenosed.  The left ventricular systolic function is normal.  LV end diastolic pressure is normal.  The left ventricular ejection fraction is 55-65% by visual estimate.   Echo 11/28/2020 1. Left ventricular ejection fraction, by estimation, is 55 to 60%. The  left ventricle has normal function. The left ventricle has no regional  wall motion abnormalities. There is mild left ventricular hypertrophy.  Left ventricular diastolic parameters  are consistent with Grade I diastolic dysfunction (impaired relaxation).  2. Right ventricular systolic function is normal. The right ventricular  size is normal. There is mildly elevated pulmonary artery systolic  pressure. The estimated right ventricular systolic pressure is 24.5 mmHg.  3. The mitral valve is normal in structure. Trivial mitral valve  regurgitation. No evidence of mitral stenosis.  4. The aortic valve is tricuspid. Aortic valve regurgitation is not  visualized. Mild aortic valve sclerosis is present, with no evidence of  aortic valve stenosis.  5. The inferior vena cava is normal in size with greater than 50%  respiratory variability, suggesting right atrial pressure of 3 mmHg.   EKG:  EKG is not ordered today.   Recent Labs: 11/27/2020: ALT 17; Magnesium 2.2; TSH 1.047 11/28/2020: BUN 17; Creatinine,  Ser 1.00; Hemoglobin 14.4; Platelets 187; Potassium 4.0; Sodium 134  Recent Lipid Panel    Component Value Date/Time   CHOL 138 11/28/2020 0159   TRIG 87 11/28/2020 0159   HDL 34 (L) 11/28/2020 0159   CHOLHDL 4.1 11/28/2020 0159   VLDL 17 11/28/2020 0159   LDLCALC 87 11/28/2020 0159     Risk Assessment/Calculations:       Physical Exam:    VS:  BP (!) 158/90 (BP Location: Left Arm)   Pulse 83   Ht 6' (1.829 m)   Wt 173 lb 6.4 oz (78.7 kg)   BMI 23.52 kg/m     Wt Readings from Last 3 Encounters:  12/19/20 173 lb 6.4 oz (78.7 kg)  11/27/20 162 lb (73.5 kg)  07/14/20 165 lb 12.8 oz (75.2 kg)     GEN:  Well nourished, well developed in no acute distress HEENT: Normal NECK: No JVD; No carotid bruits LYMPHATICS: No lymphadenopathy CARDIAC: RRR, no murmurs, rubs, gallops RESPIRATORY:  Clear to auscultation without rales, wheezing or rhonchi  ABDOMEN: Soft, non-tender, non-distended MUSCULOSKELETAL:  No edema; No deformity  SKIN: Warm and dry NEUROLOGIC:  Alert and oriented x 3 PSYCHIATRIC:  Normal affect   ASSESSMENT:    1. Coronary artery disease involving native coronary artery of native heart without angina pectoris   2. Hyperlipidemia, unspecified hyperlipidemia type   3. Essential hypertension   4. Controlled type 2 diabetes mellitus without complication, without long-term current use of insulin ([place])   5. Chronic obstructive pulmonary disease, unspecified COPD type ([place])   6. Tobacco abuse    PLAN:    In order of problems listed above:  1. CAD: Recent cardiac catheterization showed severe disease in small D1 and D2, otherwise nonobstructive CAD.  Medical management was recommended.  Continue aspirin and Plavix.  2. HTN: Blood pressure stable  3. HLD: On Lipitor. Fasting lipid panel LFT in 6 to 8 weeks.  4. DM II: Managed by primary care provider  5. COPD: No acute exacerbation  6. Tobacco abuse: Tobacco cessation emphasized.   Medication  Adjustments/Labs and Tests Ordered:  Current medicines are reviewed at length with the patient today.  Concerns regarding medicines are outlined above.  Orders Placed This Encounter  Procedures  . Hepatic function panel  . Lipid panel   Meds ordered this encounter  Medications  . varenicline (CHANTIX STARTING MONTH PAK) 0.5 MG X 11 & 1 MG X 42 tablet    Sig: Take one 0.5 mg tablet by mouth once daily for 3 days, then increase to one 0.5 mg tablet twice daily for 4 days, then increase to one 1 mg tablet twice daily.    Dispense:  53 tablet    Refill:  0    Patient Instructions  Medication Instructions:   START Chantix-Take one 0.5 mg tablet by mouth once daily for 3 days, then increase to one 0.5 mg tablet twice daily for 4 days, then increase to one 1 mg tablet twice daily.  *If you need a refill on your cardiac medications before your next appointment, please call your pharmacy*  Lab Work: Your physician recommends that you return for lab work in 2 months-02/18/21:   Fasting Lipid Panel-DO NOT EAT OR DRINK PAST MIDNIGHT. OKAY TO HAVE WATER.  Hepatic (Liver) Function Test  If you have labs (blood work) drawn today and your tests are completely normal, you will receive your results only by: Marland Kitchen MyChart Message (if you have MyChart) OR . A paper copy in the mail If you have any lab test that is abnormal or we need to change your treatment, we will call you to review the results.  Testing/Procedures: NONE ordered at this time of appointment   Follow-Up: At [facility], you and your health needs are our priority.  As part of our continuing mission to provide you with exceptional heart care, we have created designated Provider Care Teams.  These Care Teams include your primary Cardiologist (physician) and Advanced Practice Providers (APPs -  Physician Assistants and Nurse Practitioners) who all work together to provide you with the care you need, when you need it.  We recommend  signing up for the patient portal called "MyChart".  Sign up information is provided on this After Visit Summary.  MyChart is used to connect with patients for Virtual Visits (Telemedicine).  Patients are able to view lab/test results, encounter notes, upcoming appointments, etc.  Non-urgent messages can be sent to your provider as well.   To learn more about what you can do with MyChart, go to [email or web address].    Your next appointment:   3 month(s)  The format for your next appointment:   In Person  Provider:   Cherlynn Kaiser, MD  Other Instructions      Signed, Almyra Deforest, [place]  12/21/2020 11:10 PM    [place]

## 2020-12-21 ENCOUNTER — Encounter: Payer: Self-pay | Admitting: Physician Assistant

## 2021-01-06 ENCOUNTER — Other Ambulatory Visit: Payer: Self-pay | Admitting: Internal Medicine

## 2021-01-09 ENCOUNTER — Other Ambulatory Visit: Payer: Self-pay | Admitting: Internal Medicine

## 2021-01-18 ENCOUNTER — Encounter: Payer: Self-pay | Admitting: Internal Medicine

## 2021-01-18 NOTE — Patient Instructions (Addendum)
Blood work was ordered.     Medications changes include :   no     Please followup in 6 months     Health Maintenance, Male Adopting a healthy lifestyle and getting preventive care are important in promoting health and wellness. Ask your health care provider about: The right schedule for you to have regular tests and exams. Things you can do on your own to prevent diseases and keep yourself healthy. What should I know about diet, weight, and exercise? Eat a healthy diet  Eat a diet that includes plenty of vegetables, fruits, low-fat dairy products, and lean protein. Do not eat a lot of foods that are high in solid fats, added sugars, or sodium.  Maintain a healthy weight Body mass index (BMI) is a measurement that can be used to identify possible weight problems. It estimates body fat based on height and weight. Your health care provider can help determine your BMI and help you achieve or maintain ahealthy weight. Get regular exercise Get regular exercise. This is one of the most important things you can do for your health. Most adults should: Exercise for at least 150 minutes each week. The exercise should increase your heart rate and make you sweat (moderate-intensity exercise). Do strengthening exercises at least twice a week. This is in addition to the moderate-intensity exercise. Spend less time sitting. Even light physical activity can be beneficial. Watch cholesterol and blood lipids Have your blood tested for lipids and cholesterol at 67 years of age, then havethis test every 5 years. You may need to have your cholesterol levels checked more often if: Your lipid or cholesterol levels are high. You are older than 67 years of age. You are at high risk for heart disease. What should I know about cancer screening? Many types of cancers can be detected early and may often be prevented. Depending on your health history and family history, you may need to have cancer screening  at various ages. This may include screening for: Colorectal cancer. Prostate cancer. Skin cancer. Lung cancer. What should I know about heart disease, diabetes, and high blood pressure? Blood pressure and heart disease High blood pressure causes heart disease and increases the risk of stroke. This is more likely to develop in people who have high blood pressure readings, are of African descent, or are overweight. Talk with your health care provider about your target blood pressure readings. Have your blood pressure checked: Every 3-5 years if you are 10-36 years of age. Every year if you are 76 years old or older. If you are between the ages of 71 and 63 and are a current or former smoker, ask your health care provider if you should have a one-time screening for abdominal aortic aneurysm (AAA). Diabetes Have regular diabetes screenings. This checks your fasting blood sugar level. Have the screening done: Once every three years after age 10 if you are at a normal weight and have a low risk for diabetes. More often and at a younger age if you are overweight or have a high risk for diabetes. What should I know about preventing infection? Hepatitis B If you have a higher risk for hepatitis B, you should be screened for this virus. Talk with your health care provider to find out if you are at risk forhepatitis B infection. Hepatitis C Blood testing is recommended for: Everyone born from [birth year] through 1967. Anyone with known risk factors for hepatitis C. Sexually transmitted infections (STIs) You should be screened each  year for STIs, including gonorrhea and chlamydia, if: You are sexually active and are younger than 67 years of age. You are older than 67 years of age and your health care provider tells you that you are at risk for this type of infection. Your sexual activity has changed since you were last screened, and you are at increased risk for chlamydia or gonorrhea. Ask your health care  provider if you are at risk. Ask your health care provider about whether you are at high risk for HIV. Your health care provider may recommend a prescription medicine to help prevent HIV infection. If you choose to take medicine to prevent HIV, you should first get tested for HIV. You should then be tested every 3 months for as long as you are taking the medicine. Follow these instructions at home: Lifestyle Do not use any products that contain nicotine or tobacco, such as cigarettes, e-cigarettes, and chewing tobacco. If you need help quitting, ask your health care provider. Do not use street drugs. Do not share needles. Ask your health care provider for help if you need support or information about quitting drugs. Alcohol use Do not drink alcohol if your health care provider tells you not to drink. If you drink alcohol: Limit how much you have to 0-2 drinks a day. Be aware of how much alcohol is in your drink. In the [place] one drink equals one 12 oz bottle of beer (355 mL), one 5 oz glass of wine (148 mL), or one 1 oz glass of hard liquor (44 mL). General instructions Schedule regular health, dental, and eye exams. Stay current with your vaccines. Tell your health care provider if: You often feel depressed. You have ever been abused or do not feel safe at home. Summary Adopting a healthy lifestyle and getting preventive care are important in promoting health and wellness. Follow your health care provider's instructions about healthy diet, exercising, and getting tested or screened for diseases. Follow your health care provider's instructions on monitoring your cholesterol and blood pressure. This information is not intended to replace advice given to you by your health care provider. Make sure you discuss any questions you have with your healthcare provider. Document Revised: 07/19/2018 Document Reviewed: 07/19/2018 Elsevier Patient Education  2022 [employer].

## 2021-01-18 NOTE — Progress Notes (Signed)
Subjective:    Patient ID: Keith [name], male    DOB: 05-Oct-1955, 65 y.o.   MRN: [id number]  HPI He is here for follow up of his chronic medical conditions.   Admitted in April with a NSTEMI.   Echo on 11/29/20 - EF 35%-60%, grade 1 DD.  Catherization (90% D1, 90% D2, 40% mid LAD, 20% ostial left circumflex lesion) performed - medical therapy advised.     He feels tired all the time.  He does not have motivation.  His relationship with his wife is still terrible.    He has not taken his medications in about one month. -- he feels by taking his medications he is just prolonging his misery.    He still smokes and drinks about 4 beers a night.  Medications and allergies reviewed with patient and updated if appropriate.  Patient Active Problem List   Diagnosis Date Noted   CAD (coronary artery disease) 12/03/2020   ACS (acute coronary syndrome) ([place]) 11/27/2020   NSTEMI (non-ST elevated myocardial infarction) ([place]) 11/27/2020   Burning pain 01/14/2020   TFCC (triangular fibrocartilage complex) injury, left, initial encounter 09/05/2017   Polyarthropathy 09/05/2017   Arthritis of left wrist 08/05/2017   Left wrist pain 07/08/2017   S/P lumbar spinal fusion 05/14/2015   Alcohol abuse 04/23/2015   DM (diabetes mellitus) type II controlled with renal manifestation ([place]) 04/23/2015   Lumbar radiculopathy 01/23/2015   CARPAL TUNNEL SYNDROME 12/22/2009   HYPERPLASIA PROSTATE UNS W/O UR OBST & OTH LUTS 12/22/2009   DIVERTICULOSIS, COLON 10/03/2008   COLONIC POLYPS, HX OF 10/03/2008   Hyperlipidemia 08/02/2007   Tobacco dependence due to cigarettes 02/06/2007   Essential hypertension 02/06/2007    Current Outpatient Medications on File Prior to Visit  Medication Sig Dispense Refill   aspirin EC 81 MG EC tablet Take 1 tablet (81 mg total) by mouth daily. Swallow whole. 90 tablet 3   atorvastatin (LIPITOR) 80 MG tablet Take 1 tablet (80 mg total) by mouth daily. 30 tablet 6    cloNIDine (CATAPRES) 0.2 MG tablet Take 1 tablet (0.2 mg total) by mouth 2 (two) times daily. 180 tablet 0   clopidogrel (PLAVIX) 75 MG tablet Take 1 tablet (75 mg total) by mouth daily with breakfast. 30 tablet 6   hydrALAZINE (APRESOLINE) 50 MG tablet Take 2 tablets (100 mg total) by mouth 2 (two) times daily. 360 tablet 0   isosorbide mononitrate (IMDUR) 30 MG 24 hr tablet Take 1 tablet (30 mg total) by mouth daily. 90 tablet 3   losartan-hydrochlorothiazide (HYZAAR) 100-25 MG tablet Take 1 tablet by mouth daily.     metoprolol tartrate (LOPRESSOR) 25 MG tablet Take 1 tablet (25 mg total) by mouth 2 (two) times daily. 180 tablet 0   nitroGLYCERIN (NITROSTAT) 0.4 MG SL tablet Place 1 tablet (0.4 mg total) under the tongue every 5 (five) minutes x 3 doses as needed for chest pain. 25 tablet 12   traMADol (ULTRAM) 50 MG tablet TAKE 1 TABLET BY MOUTH EVERY 12 HOURS AS NEEDED FOR MODERATE PAIN 60 tablet 0   No current facility-administered medications on file prior to visit.    Past Medical History:  Diagnosis Date   Anxiety    Cataract    beginnings    Chronic kidney disease 04/23/15   ARF- "resolved"   Colon polyp    COPD (chronic obstructive pulmonary disease) ([place])    per pt from smoking    Dehydration 04/2015  Hospitalized for 4 days after abnormal labs found during preop appointment.   Depression    Diabetes mellitus without complication ([place])    T2I 04/24/15- diet controlled    Diverticulosis    Hyperlipidemia    Hypertension    Tobacco abuse     Past Surgical History:  Procedure Laterality Date   COLONOSCOPY     colonoscopy with polypectomy     polyp X 1   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   LEFT HEART CATH AND CORONARY ANGIOGRAPHY N/A 11/27/2020   Procedure: LEFT HEART CATH AND CORONARY ANGIOGRAPHY;  Surgeon: Lorretta Harp, MD;  Location: [facility];  Service: Cardiovascular;  Laterality: N/A;   MAXIMUM ACCESS (MAS)POSTERIOR LUMBAR  INTERBODY FUSION (PLIF) 2 LEVEL N/A 05/14/2015   Procedure: Lumbar three-lumbar four,  umbar four-lumbar five Maximum access posterior lumbar interbody fusion;  Surgeon: Eustace Moore, MD;  Location: [facility];  Service: Neurosurgery;  Laterality: N/A;  L3-4 L4-5 Maximum access posterior lumbar interbody fusion   POLYPECTOMY     WISDOM TOOTH EXTRACTION      Social History   Socioeconomic History   Marital status: Married    Spouse name: Not on file   Number of children: Not on file   Years of education: Not on file   Highest education level: Not on file  Occupational History   Not on file  Tobacco Use   Smoking status: Every Day    Packs/day: 1.50    Years: 40.00    Pack years: 60.00    Types: Cigarettes    Last attempt to quit: 04/19/2015    Years since quitting: 5.7   Smokeless tobacco: Never  Vaping Use   Vaping Use: Never used  Substance and Sexual Activity   Alcohol use: Yes    Alcohol/week: 3.0 standard drinks    Types: 3 Cans of beer per week    Comment: daily drinks- 12 pack on the weekends case/weekend,    Drug use: No   Sexual activity: Not on file  Other Topics Concern   Not on file  Social History Narrative   Not on file   Social Determinants of Health   Financial Resource Strain: Not on file  Food Insecurity: Not on file  Transportation Needs: Not on file  Physical Activity: Not on file  Stress: Not on file  Social Connections: Not on file    Family History  Problem Relation Age of Onset   Stroke Father    Melanoma Father    Stomach cancer Father    Alcohol abuse Father    Diabetes Mother    Diabetes Brother    Breast cancer Sister    Colon cancer Neg Hx    Esophageal cancer Neg Hx    Rectal cancer Neg Hx    Colon polyps Neg Hx     Review of Systems  Constitutional:  Negative for fever.  Respiratory:  Positive for cough, shortness of breath and wheezing.   Cardiovascular:  Positive for chest pain (with coughing hard or certain movement)  and leg swelling (ankles at end of day). Negative for palpitations.  Neurological:  Negative for light-headedness and headaches.      Objective:   Vitals:   01/19/21 1008 01/19/21 1102  BP: (!) 160/100 140/80  Pulse: 74   Temp: 98.4 F (36.9 C)   SpO2: 99%    Filed Weights   01/19/21 1008  Weight:  171 lb (77.6 kg)   Body mass index is 23.19 kg/m.  BP Readings from Last 3 Encounters:  01/19/21 140/80  12/19/20 (!) 158/90  11/28/20 130/76    Wt Readings from Last 3 Encounters:  01/19/21 171 lb (77.6 kg)  12/19/20 173 lb 6.4 oz (78.7 kg)  11/27/20 162 lb (73.5 kg)     Physical Exam Constitutional: Appears well-developed and well-nourished. No distress.  Head: Normocephalic and atraumatic.  Neck: Neck supple. No tracheal deviation present. No thyromegaly present.  No cervical lymphadenopathy Cardiovascular: Normal rate, regular rhythm and normal heart sounds.  No murmur heard. No carotid bruit .  No edema Pulmonary/Chest: Effort normal and breath sounds normal. No respiratory distress. No has no wheezes. No rales.  Skin: Skin is warm and dry. Not diaphoretic.  Psychiatric: Normal mood and affect. Behavior is normal.    ECHOCARDIOGRAM COMPLETE    ECHOCARDIOGRAM REPORT       Patient Name:   Keith [name] Date of Exam: 11/28/2020 Medical Rec #:  [id number]       Height:       72.0 in Accession #:    [id number]      Weight:       162.0 lb Date of Birth:  1956/06/07       BSA:          1.948 m Patient Age:    6 years        BP:           130/76 mmHg Patient Gender: M               HR:           62 bpm. Exam Location:  Inpatient  Procedure: 2D Echo, Cardiac Doppler and Color Doppler  Indications:    NSTEMI   History:        Patient has no prior history of Echocardiogram examinations.                 COPD; Risk Factors:Hypertension, Diabetes and Dyslipidemia.   Sonographer:    Luisa Hart RDCS Referring Phys: [place] Comments:  11/27/20 cath IMPRESSIONS   1. Left ventricular ejection fraction, by estimation, is 55 to 60%. The left ventricle has normal function. The left ventricle has no regional wall motion abnormalities. There is mild left ventricular hypertrophy. Left ventricular diastolic parameters  are consistent with Grade I diastolic dysfunction (impaired relaxation).  2. Right ventricular systolic function is normal. The right ventricular size is normal. There is mildly elevated pulmonary artery systolic pressure. The estimated right ventricular systolic pressure is 00.8 mmHg.  3. The mitral valve is normal in structure. Trivial mitral valve regurgitation. No evidence of mitral stenosis.  4. The aortic valve is tricuspid. Aortic valve regurgitation is not visualized. Mild aortic valve sclerosis is present, with no evidence of aortic valve stenosis.  5. The inferior vena cava is normal in size with greater than 50% respiratory variability, suggesting right atrial pressure of 3 mmHg.  FINDINGS  Left Ventricle: Left ventricular ejection fraction, by estimation, is 55 to 60%. The left ventricle has normal function. The left ventricle has no regional wall motion abnormalities. The left ventricular internal cavity size was normal in size. There is  mild left ventricular hypertrophy. Left ventricular diastolic parameters are consistent with Grade I diastolic dysfunction (impaired relaxation).  Right Ventricle: The right ventricular size is normal. No increase in right ventricular wall thickness. Right ventricular systolic  function is normal. There is mildly elevated pulmonary artery systolic pressure. The tricuspid regurgitant velocity is 3.08  m/s, and with an assumed right atrial pressure of 3 mmHg, the estimated right ventricular systolic pressure is 25.3 mmHg.  Left Atrium: Left atrial size was normal in size.  Right Atrium: Right atrial size was normal in size.  Pericardium: There is no evidence of pericardial  effusion.  Mitral Valve: The mitral valve is normal in structure. Mild mitral annular calcification. Trivial mitral valve regurgitation. No evidence of mitral valve stenosis.  Tricuspid Valve: The tricuspid valve is normal in structure. Tricuspid valve regurgitation is trivial.  Aortic Valve: The aortic valve is tricuspid. Aortic valve regurgitation is not visualized. Mild aortic valve sclerosis is present, with no evidence of aortic valve stenosis. Aortic valve mean gradient measures 8.0 mmHg. Aortic valve peak gradient  measures 16.6 mmHg. Aortic valve area, by VTI measures 2.73 cm.  Pulmonic Valve: The pulmonic valve was normal in structure. Pulmonic valve regurgitation is not visualized.  Aorta: The aortic root is normal in size and structure.  Venous: The inferior vena cava is normal in size with greater than 50% respiratory variability, suggesting right atrial pressure of 3 mmHg.  IAS/Shunts: No atrial level shunt detected by color flow Doppler.    LEFT VENTRICLE PLAX 2D LVIDd:         4.90 cm     Diastology LVIDs:         3.20 cm     LV e' medial:    7.30 cm/s LV PW:         1.50 cm     LV E/e' medial:  13.4 LV IVS:        1.50 cm     LV e' lateral:   8.68 cm/s LVOT diam:     2.40 cm     LV E/e' lateral: 11.2 LV SV:         116 LV SV Index:   60 LVOT Area:     4.52 cm   LV Volumes (MOD) LV vol d, MOD A2C: 63.5 ml LV vol d, MOD A4C: 59.3 ml LV vol s, MOD A2C: 18.0 ml LV vol s, MOD A4C: 27.5 ml LV SV MOD A2C:     45.5 ml LV SV MOD A4C:     59.3 ml LV SV MOD BP:      38.3 ml  RIGHT VENTRICLE RV S prime:     15.40 cm/s TAPSE (M-mode): 2.1 cm  LEFT ATRIUM             Index       RIGHT ATRIUM           Index LA diam:        3.80 cm 1.95 cm/m  RA Area:     11.30 cm LA Vol (A2C):   59.5 ml 30.54 ml/m RA Volume:   24.50 ml  12.58 ml/m LA Vol (A4C):   35.0 ml 17.97 ml/m LA Biplane Vol: 46.4 ml 23.82 ml/m  AORTIC VALVE                    PULMONIC VALVE AV Area  (Vmax):    2.68 cm     PV Vmax:       0.81 m/s AV Area (Vmean):   3.15 cm     PV Vmean:      65.100 cm/s AV Area (VTI):     2.73 cm     PV  VTI:        0.240 m AV Vmax:           204.00 cm/s  PV Peak grad:  2.6 mmHg AV Vmean:          131.000 cm/s PV Mean grad:  2.0 mmHg AV VTI:            0.426 m AV Peak Grad:      16.6 mmHg AV Mean Grad:      8.0 mmHg LVOT Vmax:         121.00 cm/s LVOT Vmean:        91.100 cm/s LVOT VTI:          0.257 m LVOT/AV VTI ratio: 0.60   AORTA Ao Root diam: 3.60 cm Ao Asc diam:  3.30 cm  MITRAL VALVE               TRICUSPID VALVE MV Area (PHT): 3.60 cm    TR Peak grad:   37.9 mmHg MV Decel Time: 211 msec    TR Vmax:        308.00 cm/s MV E velocity: 97.50 cm/s MV A velocity: 96.00 cm/s  SHUNTS MV E/A ratio:  1.02        Systemic VTI:  0.26 m                            Systemic Diam: 2.40 cm  Loralie Champagne MD Electronically signed by Loralie Champagne MD Signature Date/Time: 11/28/2020/4:37:01 PM      Final      Lab Results  Component Value Date   WBC 10.2 11/28/2020   HGB 14.4 11/28/2020   HCT 42.4 11/28/2020   PLT 187 11/28/2020   GLUCOSE 118 (H) 11/28/2020   CHOL 138 11/28/2020   TRIG 87 11/28/2020   HDL 34 (L) 11/28/2020   LDLCALC 87 11/28/2020   ALT 17 11/27/2020   AST 48 (H) 11/27/2020   NA 134 (L) 11/28/2020   K 4.0 11/28/2020   CL 101 11/28/2020   CREATININE 1.00 11/28/2020   BUN 17 11/28/2020   CO2 25 11/28/2020   TSH 1.047 11/27/2020   PSA 1.21 10/25/2011   INR 0.9 11/27/2020   HGBA1C 6.1 (H) 11/27/2020   MICROALBUR 1.1 11/10/2015         Assessment & Plan:    I spent 30 minutes dedicated to the care of this patient on the date of this encounter including review of recent labs, imaging and procedures from the hospital, speciality notes, obtaining history, communicating with the patient, discussing at length why he needs to be taking his medication daily, ordering labs, and documenting clinical information in the  EHR    This visit occurred during the SARS-CoV-2 public health emergency.  Safety protocols were in place, including screening questions prior to the visit, additional usage of staff PPE, and extensive cleaning of exam room while observing appropriate contact time as indicated for disinfecting solutions.

## 2021-01-19 ENCOUNTER — Other Ambulatory Visit: Payer: Self-pay

## 2021-01-19 ENCOUNTER — Ambulatory Visit (INDEPENDENT_AMBULATORY_CARE_PROVIDER_SITE_OTHER): Payer: Medicare Other | Admitting: Internal Medicine

## 2021-01-19 VITALS — BP 140/80 | HR 74 | Temp 98.4°F | Ht 72.0 in | Wt 171.0 lb

## 2021-01-19 DIAGNOSIS — E7849 Other hyperlipidemia: Secondary | ICD-10-CM | POA: Diagnosis not present

## 2021-01-19 DIAGNOSIS — I2510 Atherosclerotic heart disease of native coronary artery without angina pectoris: Secondary | ICD-10-CM | POA: Diagnosis not present

## 2021-01-19 DIAGNOSIS — I10 Essential (primary) hypertension: Secondary | ICD-10-CM | POA: Diagnosis not present

## 2021-01-19 DIAGNOSIS — I200 Unstable angina: Secondary | ICD-10-CM | POA: Diagnosis not present

## 2021-01-19 DIAGNOSIS — E1122 Type 2 diabetes mellitus with diabetic chronic kidney disease: Secondary | ICD-10-CM | POA: Diagnosis not present

## 2021-01-19 DIAGNOSIS — Z125 Encounter for screening for malignant neoplasm of prostate: Secondary | ICD-10-CM

## 2021-01-19 LAB — COMPREHENSIVE METABOLIC PANEL
ALT: 12 U/L (ref 0–53)
AST: 15 U/L (ref 0–37)
Albumin: 4.3 g/dL (ref 3.5–5.2)
Alkaline Phosphatase: 84 U/L (ref 39–117)
BUN: 12 mg/dL (ref 6–23)
CO2: 28 mEq/L (ref 19–32)
Calcium: 9.2 mg/dL (ref 8.4–10.5)
Chloride: 103 mEq/L (ref 96–112)
Creatinine, Ser: 1.09 mg/dL (ref 0.40–1.50)
GFR: 70.46 mL/min (ref >60.00)
Glucose, Bld: 109 mg/dL — ABNORMAL HIGH (ref 70–99)
Potassium: 4.5 mEq/L (ref 3.5–5.1)
Sodium: 140 mEq/L (ref 135–145)
Total Bilirubin: 0.6 mg/dL (ref 0.2–1.2)
Total Protein: 7.4 g/dL (ref 6.0–8.3)

## 2021-01-19 LAB — LIPID PANEL
Cholesterol: 184 mg/dL (ref 0–200)
HDL: 45.1 mg/dL (ref >39.00)
LDL Cholesterol: 118 mg/dL — ABNORMAL HIGH (ref 0–99)
NonHDL: 139.38
Total CHOL/HDL Ratio: 4
Triglycerides: 106 mg/dL (ref 0.0–149.0)
VLDL: 21.2 mg/dL (ref 0.0–40.0)

## 2021-01-19 LAB — CBC WITH DIFFERENTIAL/PLATELET
Basophils Absolute: 0 10*3/uL (ref 0.0–0.1)
Basophils Relative: 0.5 % (ref 0.0–3.0)
Eosinophils Absolute: 0.2 10*3/uL (ref 0.0–0.7)
Eosinophils Relative: 2 % (ref 0.0–5.0)
HCT: 48.8 % (ref 39.0–52.0)
Hemoglobin: 16.3 g/dL (ref 13.0–17.0)
Lymphocytes Relative: 25 % (ref 12.0–46.0)
Lymphs Abs: 1.9 10*3/uL (ref 0.7–4.0)
MCHC: 33.3 g/dL (ref 30.0–36.0)
MCV: 96.6 fl (ref 78.0–100.0)
Monocytes Absolute: 0.7 10*3/uL (ref 0.1–1.0)
Monocytes Relative: 8.9 % (ref 3.0–12.0)
Neutro Abs: 4.9 10*3/uL (ref 1.4–7.7)
Neutrophils Relative %: 63.6 % (ref 43.0–77.0)
Platelets: 239 10*3/uL (ref 150.0–400.0)
RBC: 5.05 Mil/uL (ref 4.22–5.81)
RDW: 16.4 % — ABNORMAL HIGH (ref 11.5–15.5)
WBC: 7.6 10*3/uL (ref 4.0–10.5)

## 2021-01-19 LAB — TSH: TSH: 0.96 u[IU]/mL (ref 0.35–4.50)

## 2021-01-19 LAB — HEMOGLOBIN A1C: Hgb A1c MFr Bld: 6.3 % (ref 4.6–6.5)

## 2021-01-19 LAB — PSA, MEDICARE: PSA: 1.1 ng/ml (ref 0.10–4.00)

## 2021-01-19 NOTE — Assessment & Plan Note (Signed)
Chronic Not controlled - due to noncompliance with medications Stressed taking medications daily cmp

## 2021-01-19 NOTE — Assessment & Plan Note (Signed)
Chronic Has chest pain that sounds non-cardiac - occurs with cough, certain movements Not taking any meds x 1 month - stressed taking medications Still smoking, drinking too much - stressed smoking and alcohol cessation Has fu with cardio

## 2021-01-19 NOTE — Assessment & Plan Note (Signed)
Chronic Diet controlled a1c today

## 2021-01-19 NOTE — Assessment & Plan Note (Signed)
Chronic Not taking medication Check lipids, cmp today He will start taking his medication

## 2021-02-11 ENCOUNTER — Other Ambulatory Visit: Payer: Self-pay | Admitting: Internal Medicine

## 2021-02-23 LAB — LIPID PANEL
Chol/HDL Ratio: 4.2 ratio (ref 0.0–5.0)
Cholesterol, Total: 171 mg/dL (ref 100–199)
HDL: 41 mg/dL (ref >39)
LDL Chol Calc (NIH): 113 mg/dL — ABNORMAL HIGH (ref 0–99)
Triglycerides: 92 mg/dL (ref 0–149)
VLDL Cholesterol Cal: 17 mg/dL (ref 5–40)

## 2021-02-23 LAB — HEPATIC FUNCTION PANEL
ALT: 11 IU/L (ref 0–44)
AST: 18 IU/L (ref 0–40)
Albumin: 4.4 g/dL (ref 3.8–4.8)
Alkaline Phosphatase: 81 IU/L (ref 44–121)
Bilirubin Total: 0.4 mg/dL (ref 0.0–1.2)
Bilirubin, Direct: 0.14 mg/dL (ref 0.00–0.40)
Total Protein: 7 g/dL (ref 6.0–8.5)

## 2021-03-02 ENCOUNTER — Other Ambulatory Visit: Payer: Self-pay

## 2021-03-02 DIAGNOSIS — E785 Hyperlipidemia, unspecified: Secondary | ICD-10-CM

## 2021-03-16 ENCOUNTER — Ambulatory Visit (INDEPENDENT_AMBULATORY_CARE_PROVIDER_SITE_OTHER): Payer: Medicare Other

## 2021-03-16 ENCOUNTER — Other Ambulatory Visit: Payer: Self-pay

## 2021-03-16 ENCOUNTER — Ambulatory Visit
Admission: EM | Admit: 2021-03-16 | Discharge: 2021-03-16 | Disposition: A | Discharge status: Home / Self Care | Payer: Medicare Other | Attending: Emergency Medicine | Admitting: Emergency Medicine

## 2021-03-16 ENCOUNTER — Encounter: Payer: Self-pay | Admitting: Emergency Medicine

## 2021-03-16 DIAGNOSIS — R059 Cough, unspecified: Secondary | ICD-10-CM

## 2021-03-16 DIAGNOSIS — R0602 Shortness of breath: Secondary | ICD-10-CM

## 2021-03-16 DIAGNOSIS — S2241XA Multiple fractures of ribs, right side, initial encounter for closed fracture: Secondary | ICD-10-CM

## 2021-03-16 MED ORDER — MELOXICAM 15 MG PO TABS: 15.0000 mg | ORAL_TABLET | Freq: Every day | ORAL | 0 refills | Status: DC

## 2021-03-16 MED ORDER — KETOROLAC TROMETHAMINE 60 MG/2ML IM SOLN
60.0000 mg | Freq: Once | INTRAMUSCULAR | Status: AC
  Administered 2021-03-16: 60 mg via INTRAMUSCULAR

## 2021-03-16 MED ORDER — LIDOCAINE 5 % EX OINT: 1.0000 "application " | TOPICAL_OINTMENT | CUTANEOUS | 0 refills | Status: DC | PRN

## 2021-03-16 NOTE — ED Provider Notes (Signed)
[facility]    CSN: [id number] Arrival date & time: 03/16/21  0813      History   Chief Complaint Chief Complaint  Patient presents with   Fall    HPI Keith [name] is a 65 y.o. male.   Patient presents with right flank pain beginning yesterday evening after fall coming out of should, feel on the right side into metal object.  Painful with twisting, turning, coughing, sneezing, deep breathing.  Has not attempted to bend over.  Denies numbness, tingling, bruising, shortness of breath, abdominal pain.  Has not attempted treatment.  History of MI, DM type II, HTN, HLD lumbar fusion, COPD, CKD.  Not taking any medication for 1 month.  Was on Plavix.  Past Medical History:  Diagnosis Date   Anxiety    Cataract    beginnings    Chronic kidney disease 04/23/15   ARF- "resolved"   Colon polyp    COPD (chronic obstructive pulmonary disease) ([place])    per pt from smoking    Dehydration 04/2015   Hospitalized for 4 days after abnormal labs found during preop appointment.   Depression    Diabetes mellitus without complication ([place])    [id number] 04/24/15- diet controlled    Diverticulosis    Hyperlipidemia    Hypertension    Tobacco abuse     Patient Active Problem List   Diagnosis Date Noted   CAD (coronary artery disease) 12/03/2020   ACS (acute coronary syndrome) ([place]) 11/27/2020   NSTEMI (non-ST elevated myocardial infarction) ([place]) 11/27/2020   Burning pain 01/14/2020   TFCC (triangular fibrocartilage complex) injury, left, initial encounter 09/05/2017   Polyarthropathy 09/05/2017   Arthritis of left wrist 08/05/2017   Left wrist pain 07/08/2017   S/P lumbar spinal fusion 05/14/2015   Alcohol abuse 04/23/2015   DM (diabetes mellitus) type II controlled with renal manifestation ([place]) 04/23/2015   Lumbar radiculopathy 01/23/2015   CARPAL TUNNEL SYNDROME 12/22/2009   HYPERPLASIA PROSTATE UNS W/O UR OBST & OTH LUTS 12/22/2009   DIVERTICULOSIS, COLON 10/03/2008    COLONIC POLYPS, HX OF 10/03/2008   Hyperlipidemia 08/02/2007   Tobacco dependence due to cigarettes 02/06/2007   Essential hypertension 02/06/2007    Past Surgical History:  Procedure Laterality Date   COLONOSCOPY     colonoscopy with polypectomy     polyp X 1   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   LEFT HEART CATH AND CORONARY ANGIOGRAPHY N/A 11/27/2020   Procedure: LEFT HEART CATH AND CORONARY ANGIOGRAPHY;  Surgeon: Lorretta Harp, MD;  Location: [facility];  Service: Cardiovascular;  Laterality: N/A;   MAXIMUM ACCESS (MAS)POSTERIOR LUMBAR INTERBODY FUSION (PLIF) 2 LEVEL N/A 05/14/2015   Procedure: Lumbar three-lumbar four,  umbar four-lumbar five Maximum access posterior lumbar interbody fusion;  Surgeon: Eustace Moore, MD;  Location: [facility];  Service: Neurosurgery;  Laterality: N/A;  L3-4 L4-5 Maximum access posterior lumbar interbody fusion   POLYPECTOMY     [place] Medications    Prior to Admission medications   Medication Sig Start Date End Date Taking? Authorizing Provider  aspirin EC 81 MG EC tablet Take 1 tablet (81 mg total) by mouth daily. Swallow whole. Patient not taking: Reported on 03/16/2021 11/29/20   Leanor Kail, PA  atorvastatin (LIPITOR) 80 MG tablet Take 1 tablet (80 mg total) by mouth daily. Patient not taking: Reported on  03/16/2021 11/29/20   Leanor Kail, PA  cloNIDine (CATAPRES) 0.2 MG tablet Take 1 tablet (0.2 mg total) by mouth 2 (two) times daily. Patient not taking: Reported on 03/16/2021 05/28/20   Binnie Rail, MD  clopidogrel (PLAVIX) 75 MG tablet Take 1 tablet (75 mg total) by mouth daily with breakfast. Patient not taking: Reported on 03/16/2021 11/29/20   Leanor Kail, PA  hydrALAZINE (APRESOLINE) 50 MG tablet Take 2 tablets (100 mg total) by mouth 2 (two) times daily. Patient not taking: Reported on 03/16/2021 05/28/20   Binnie Rail, MD  isosorbide mononitrate  (IMDUR) 30 MG 24 hr tablet Take 1 tablet (30 mg total) by mouth daily. Patient not taking: Reported on 03/16/2021 12/19/20   Elouise Munroe, MD  losartan-hydrochlorothiazide (HYZAAR) 100-25 MG tablet Take 1 tablet by mouth daily. Patient not taking: Reported on 03/16/2021    [provider]  metoprolol tartrate (LOPRESSOR) 25 MG tablet Take 1 tablet (25 mg total) by mouth 2 (two) times daily. Patient not taking: Reported on 03/16/2021 05/28/20   Binnie Rail, MD  nitroGLYCERIN (NITROSTAT) 0.4 MG SL tablet Place 1 tablet (0.4 mg total) under the tongue every 5 (five) minutes x 3 doses as needed for chest pain. Patient not taking: Reported on 03/16/2021 11/28/20   Leanor Kail, PA  traMADol (ULTRAM) 50 MG tablet TAKE 1 TABLET BY MOUTH EVERY 12 HOURS AS NEEDED FOR MODERATE PAIN Patient not taking: Reported on 03/16/2021 02/11/21   Binnie Rail, MD    Family History Family History  Problem Relation Age of Onset   Stroke Father    Melanoma Father    Stomach cancer Father    Alcohol abuse Father    Diabetes Mother    Diabetes Brother    Breast cancer Sister    Colon cancer Neg Hx    Esophageal cancer Neg Hx    Rectal cancer Neg Hx    Colon polyps Neg Hx     Social History Social History   Tobacco Use   Smoking status: Every Day    Packs/day: 1.50    Years: 40.00    Pack years: 60.00    Types: Cigarettes    Last attempt to quit: 04/19/2015    Years since quitting: 5.9   Smokeless tobacco: Never  Vaping Use   Vaping Use: Never used  Substance Use Topics   Alcohol use: Yes    Alcohol/week: 3.0 standard drinks    Types: 3 Cans of beer per week    Comment: daily drinks- 12 pack on the weekends case/weekend,    Drug use: No     Allergies   Bupropion hcl, Penicillins, and Amoxicillin   Review of Systems Review of Systems  Constitutional: Negative.   Respiratory: Negative.    Cardiovascular: Negative.   Gastrointestinal: Negative.   Genitourinary:  Positive for  flank pain. Negative for decreased urine volume, difficulty urinating, dysuria, enuresis, frequency, genital sores, hematuria, penile discharge, penile pain, penile swelling, scrotal swelling, testicular pain and urgency.  Skin: Negative.   Neurological: Negative.     Physical Exam Triage Vital Signs ED Triage Vitals  Enc Vitals Group     BP 03/16/21 0828 (!) 175/88     Pulse Rate 03/16/21 0828 83     Resp 03/16/21 0828 18     Temp 03/16/21 0828 98.4 F (36.9 C)     Temp Source 03/16/21 0828 Oral     SpO2 03/16/21 0828 98 %  Weight --      Height --      Head Circumference --      Peak Flow --      Pain Score 03/16/21 0829 8     Pain Loc --      Pain Edu? --      Excl. in [place]? --    No data found.  Updated Vital Signs BP (!) 175/88 (BP Location: Left Arm)   Pulse 83   Temp 98.4 F (36.9 C) (Oral)   Resp 18   SpO2 98%   Visual Acuity Right Eye Distance:   Left Eye Distance:   Bilateral Distance:    Right Eye Near:   Left Eye Near:    Bilateral Near:     Physical Exam Constitutional:      Appearance: Normal appearance. He is normal weight.  HENT:     Head: Normocephalic.  Eyes:     Extraocular Movements: Extraocular movements intact.  Cardiovascular:     Rate and Rhythm: Normal rate and regular rhythm.     Pulses: Normal pulses.     Heart sounds: Normal heart sounds.  Pulmonary:     Effort: Pulmonary effort is normal.     Breath sounds: Normal breath sounds.  Musculoskeletal:       Arms:     Comments: Right-sided flank tenderness, directly over the area where patient stated he landed, no ecchymosis, swelling noted, range of motion intact but elicits pain with movement, no CVA tenderness  Skin:    General: Skin is warm and dry.  Neurological:     Mental Status: He is alert and oriented to person, place, and time. Mental status is at baseline.  Psychiatric:        Mood and Affect: Mood normal.        Behavior: Behavior normal.     UC Treatments /  Results  Labs (all labs ordered are listed, but only abnormal results are displayed) Labs Reviewed - No data to display  EKG   Radiology No results found.  Procedures Procedures (including critical care time)  Medications Ordered in UC Medications - No data to display  Initial Impression / Assessment and Plan / UC Course  I have reviewed the triage vital signs and the nursing notes.  Pertinent labs & imaging results that were available during my care of the patient were reviewed by me and considered in my medical decision making (see chart for details).  Multiple right rib fractures  Discussed timeline and expectations for healing, discussed signs of respiratory involvement and if this occurs to to follow up at emergency department if this occurs   Toradol 60 mg IM now, kidney function reviewed prior to Meloxicam 15 mg daily  PCP or ortho follow up in  6 weeks Can use abdominal binder for support as needed  Final Clinical Impressions(s) / UC Diagnoses   Final diagnoses:  None   Discharge Instructions   None    ED Prescriptions   None    PDMP not reviewed this encounter.   Hans Eden, [place] 03/16/21 [phone]

## 2021-03-16 NOTE — Discharge Instructions (Addendum)
Your x-ray showed a 11 th and possible 10 rib fracture  Take meloxicam every morning  Can use tylenol throughout the day for  pain management   Healing can take up to 3 months. During this time controlling your pain  and watching for signs of lung involvement are important   Please follow up with your primary care doctor in six weeks for evaluation

## 2021-03-16 NOTE — ED Triage Notes (Signed)
Pt sts trip and fall last night landing with right side on hand truck; pt sts pain in right ribs worse with movement, sneezing or coughing; pt sts has not taken any of his meds in 1 month

## 2021-03-17 ENCOUNTER — Other Ambulatory Visit: Payer: Self-pay | Admitting: Internal Medicine

## 2021-03-26 ENCOUNTER — Ambulatory Visit (INDEPENDENT_AMBULATORY_CARE_PROVIDER_SITE_OTHER): Payer: Medicare Other | Admitting: Pharmacist

## 2021-03-26 ENCOUNTER — Other Ambulatory Visit: Payer: Self-pay

## 2021-03-26 ENCOUNTER — Telehealth: Payer: Self-pay | Admitting: Licensed Clinical Social Worker

## 2021-03-26 VITALS — BP 168/86 | HR 89 | Resp 14 | Ht 72.0 in | Wt 176.4 lb

## 2021-03-26 DIAGNOSIS — I214 Non-ST elevation (NSTEMI) myocardial infarction: Secondary | ICD-10-CM | POA: Diagnosis not present

## 2021-03-26 DIAGNOSIS — E7849 Other hyperlipidemia: Secondary | ICD-10-CM | POA: Diagnosis not present

## 2021-03-26 DIAGNOSIS — I10 Essential (primary) hypertension: Secondary | ICD-10-CM

## 2021-03-26 DIAGNOSIS — I2510 Atherosclerotic heart disease of native coronary artery without angina pectoris: Secondary | ICD-10-CM | POA: Diagnosis not present

## 2021-03-26 DIAGNOSIS — I200 Unstable angina: Secondary | ICD-10-CM
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
## 2021-03-26 NOTE — Patient Instructions (Signed)
It was nice meeting you today!  We would like your LDL (bad cholesterol) to be less than 55  I recommend you restart your atorvastatin '80mg'$  daily  I will let the contact the social worker to give you a call   If you do decide to follow through with the new medication, please give [place] a call and we will start the patient assistance paperwork for you  Karren Cobble, PharmD, BCACP, [place], [place]. [address], [place], [place] [address] Phone: [phone]; Fax: [phone] 03/26/2021 8:58 AM

## 2021-04-10 ENCOUNTER — Ambulatory Visit: Payer: Medicare Other | Admitting: Psychologist

## 2021-04-14 ENCOUNTER — Telehealth: Payer: Self-pay | Admitting: Licensed Clinical Social Worker

## 2021-04-14 NOTE — Telephone Encounter (Signed)
LCSW attempted to reach out to pt today at [phone]. No answer, voicemail not set up.   LCSW noted that pt had scheduled appt w/ Elias Else, PsyD. He has cancelled that and opted not to reschedule.   Remain available as needed/should pt return my call. Pt had been given additional mental health and community support resources should he be interested in utilizing those.   Westley Hummer, MSW, [place]  [phone]

## 2021-04-17 ENCOUNTER — Other Ambulatory Visit: Payer: Self-pay | Admitting: Internal Medicine

## 2021-04-24 ENCOUNTER — Ambulatory Visit: Payer: Medicare Other | Admitting: Internal Medicine

## 2021-05-19 ENCOUNTER — Other Ambulatory Visit: Payer: Self-pay | Admitting: Internal Medicine

## 2021-06-22 ENCOUNTER — Other Ambulatory Visit: Payer: Self-pay | Admitting: Internal Medicine

## 2021-07-22 ENCOUNTER — Ambulatory Visit: Payer: Medicare Other | Admitting: Internal Medicine

## 2021-07-29 ENCOUNTER — Other Ambulatory Visit: Payer: Self-pay | Admitting: Internal Medicine

## 2021-08-05 ENCOUNTER — Encounter (HOSPITAL_COMMUNITY): Payer: Self-pay | Admitting: *Deleted

## 2021-08-05 ENCOUNTER — Ambulatory Visit
Admission: EM | Admit: 2021-08-05 | Discharge: 2021-08-05 | Disposition: A | Discharge status: Home / Self Care | Payer: Medicare Other

## 2021-08-05 ENCOUNTER — Other Ambulatory Visit: Payer: Self-pay

## 2021-08-05 ENCOUNTER — Emergency Department (HOSPITAL_COMMUNITY): Payer: Medicare Other

## 2021-08-05 ENCOUNTER — Encounter: Payer: Self-pay | Admitting: Emergency Medicine

## 2021-08-05 ENCOUNTER — Emergency Department (HOSPITAL_COMMUNITY)
Admission: EM | Admit: 2021-08-05 | Discharge: 2021-08-06 | Disposition: A | Discharge status: Home / Self Care | Payer: Medicare Other | Attending: Emergency Medicine | Admitting: Emergency Medicine

## 2021-08-05 DIAGNOSIS — I129 Hypertensive chronic kidney disease with stage 1 through stage 4 chronic kidney disease, or unspecified chronic kidney disease: Secondary | ICD-10-CM | POA: Insufficient documentation

## 2021-08-05 DIAGNOSIS — F17210 Nicotine dependence, cigarettes, uncomplicated: Secondary | ICD-10-CM | POA: Diagnosis not present

## 2021-08-05 DIAGNOSIS — R072 Precordial pain: Secondary | ICD-10-CM | POA: Diagnosis present

## 2021-08-05 DIAGNOSIS — E1122 Type 2 diabetes mellitus with diabetic chronic kidney disease: Secondary | ICD-10-CM | POA: Diagnosis not present

## 2021-08-05 DIAGNOSIS — J449 Chronic obstructive pulmonary disease, unspecified: Secondary | ICD-10-CM | POA: Diagnosis not present

## 2021-08-05 DIAGNOSIS — F32A Depression, unspecified: Secondary | ICD-10-CM | POA: Diagnosis not present

## 2021-08-05 DIAGNOSIS — N189 Chronic kidney disease, unspecified: Secondary | ICD-10-CM | POA: Diagnosis not present

## 2021-08-05 DIAGNOSIS — R0602 Shortness of breath: Secondary | ICD-10-CM | POA: Insufficient documentation

## 2021-08-05 DIAGNOSIS — Z20822 Contact with and (suspected) exposure to covid-19: Secondary | ICD-10-CM | POA: Insufficient documentation

## 2021-08-05 DIAGNOSIS — J101 Influenza due to other identified influenza virus with other respiratory manifestations: Secondary | ICD-10-CM | POA: Insufficient documentation

## 2021-08-05 LAB — CBC WITH DIFFERENTIAL/PLATELET
Abs Immature Granulocytes: 0.04 10*3/uL (ref 0.00–0.07)
Basophils Absolute: 0 10*3/uL (ref 0.0–0.1)
Basophils Relative: 0 %
Eosinophils Absolute: 0 10*3/uL (ref 0.0–0.5)
Eosinophils Relative: 0 %
HCT: 50 % (ref 39.0–52.0)
Hemoglobin: 17.4 g/dL — ABNORMAL HIGH (ref 13.0–17.0)
Immature Granulocytes: 1 %
Lymphocytes Relative: 22 %
Lymphs Abs: 1.6 10*3/uL (ref 0.7–4.0)
MCH: 32.7 pg (ref 26.0–34.0)
MCHC: 34.8 g/dL (ref 30.0–36.0)
MCV: 94 fL (ref 80.0–100.0)
Monocytes Absolute: 1 10*3/uL (ref 0.1–1.0)
Monocytes Relative: 14 %
Neutro Abs: 4.3 10*3/uL (ref 1.7–7.7)
Neutrophils Relative %: 63 %
Platelets: 176 10*3/uL (ref 150–400)
RBC: 5.32 MIL/uL (ref 4.22–5.81)
RDW: 13.8 % (ref 11.5–15.5)
WBC: 7 10*3/uL (ref 4.0–10.5)
nRBC: 0 % (ref 0.0–0.2)

## 2021-08-05 LAB — BASIC METABOLIC PANEL
Anion gap: 12 (ref 5–15)
BUN: 14 mg/dL (ref 8–23)
CO2: 23 mmol/L (ref 22–32)
Calcium: 8.8 mg/dL — ABNORMAL LOW (ref 8.9–10.3)
Chloride: 99 mmol/L (ref 98–111)
Creatinine, Ser: 1.17 mg/dL (ref 0.61–1.24)
GFR, Estimated: >60 mL/min (ref >60)
Glucose, Bld: 119 mg/dL — ABNORMAL HIGH (ref 70–99)
Potassium: 4 mmol/L (ref 3.5–5.1)
Sodium: 134 mmol/L — ABNORMAL LOW (ref 135–145)

## 2021-08-05 LAB — TROPONIN I (HIGH SENSITIVITY): Troponin I (High Sensitivity): 15 ng/L (ref <18)

## 2021-08-05 LAB — BRAIN NATRIURETIC PEPTIDE: B Natriuretic Peptide: 87.2 pg/mL (ref 0.0–100.0)

## 2021-08-05 NOTE — ED Provider Notes (Signed)
Emergency Medicine Provider Triage Evaluation Note  Keith [name] , a 65 y.o. male  was evaluated in triage.  Pt complains of chest pain x 6 days with associated chest tightness and significant shortness of breath and dyspnea exertion.  History of MI, current everyday smoker.    Review of Systems  Positive: Chest pain, shortness of breath, dyspnea on exertion, palpitations Negative: Nausea, vomiting, diarrhea, syncope  Physical Exam  BP (!) 149/92    Pulse 98    Temp (!) 97 F (36.1 C)    Resp 18    Ht 6' (1.829 m)    Wt 80 kg    SpO2 95%    BMI 23.92 kg/m  Gen:   Awake, no distress   Resp:  Normal effort  MSK:   Moves extremities without difficulty  Other:  RRR no M/R/G.  Decreased air movement throughout the lung fields bilaterally.  Medical Decision Making  Medically screening exam initiated at 4:47 PM.  Appropriate orders placed.  Noralee Chars was informed that the remainder of the evaluation will be completed by another provider, this initial triage assessment does not replace that evaluation, and the importance of remaining in the ED until their evaluation is complete.  This chart was dictated using voice recognition software, Dragon. Despite the best efforts of this provider to proofread and correct errors, errors may still occur which can change documentation meaning.    Emeline Darling, PA-C 08/05/21 [address], [place], DO 08/06/21 [phone]

## 2021-08-05 NOTE — ED Provider Notes (Signed)
Patient presents today for shortness of breath he has had worsening over the last several days.  He is a current smoker and has been a smoker for 45 years.  He does have history of MI as well.  He states that he has difficulty even walking across his living room without feeling short of breath.  He does have other upper respiratory symptoms including headache, congested ears, and cough.  Given significant past medical history recommended further evaluation in the emergency room for stat labs and imaging.  Patient is agreeable to same.  He does feel safe transferring himself to the hospital.   Francene Finders, PA-C 08/05/21 1351

## 2021-08-05 NOTE — Discharge Instructions (Signed)
°  Please report to [facility] for further evaluation.

## 2021-08-05 NOTE — ED Triage Notes (Signed)
Since Thursday has had URI symptoms. Has become progressively SOB. Current smoker, smoked for 45 years. Hx of an MI earlier this year. Reports left sided chest pain starting 4 days prior that radiates to the right side on taking a deep breath. C/o headache and ears feeling stopped up.

## 2021-08-05 NOTE — ED Notes (Signed)
Called 3x for triage, no answer 

## 2021-08-05 NOTE — ED Notes (Signed)
NA for vitals x2.

## 2021-08-05 NOTE — ED Triage Notes (Signed)
The pt is c/o chest pain sob and exertional sob while walking to the mailbox  he was sent here from his doctors office he has chest pain at present

## 2021-08-05 NOTE — ED Notes (Signed)
The pt has not been taking his regular meds for months

## 2021-08-06 ENCOUNTER — Emergency Department (HOSPITAL_COMMUNITY): Payer: Medicare Other

## 2021-08-06 LAB — RESP PANEL BY RT-PCR (FLU A&B, COVID) ARPGX2
Influenza A by PCR: POSITIVE — AB
Influenza B by PCR: NEGATIVE
SARS Coronavirus 2 by RT PCR: NEGATIVE

## 2021-08-06 LAB — D-DIMER, QUANTITATIVE: D-Dimer, Quant: 0.68 ug/mL-FEU — ABNORMAL HIGH (ref 0.00–0.50)

## 2021-08-06 LAB — TROPONIN I (HIGH SENSITIVITY): Troponin I (High Sensitivity): 15 ng/L (ref <18)

## 2021-08-06 MED ORDER — IOHEXOL 350 MG/ML SOLN
61.0000 mL | Freq: Once | INTRAVENOUS | Status: AC | PRN
  Administered 2021-08-06: 04:00:00 61 mL via INTRAVENOUS

## 2021-08-06 MED ORDER — IPRATROPIUM-ALBUTEROL 0.5-2.5 (3) MG/3ML IN SOLN
3.0000 mL | Freq: Once | RESPIRATORY_TRACT | Status: AC
  Administered 2021-08-06: 02:00:00 3 mL via RESPIRATORY_TRACT
  Filled 2021-08-06: qty 3

## 2021-08-06 NOTE — ED Provider Notes (Signed)
Emergency Department Provider Note   I have reviewed the triage vital signs and the nursing notes.   HISTORY  Chief Complaint Chest Pain   HPI Keith [name] is a 65 y.o. male with PMH of CAD, COPD, and HLD who presents to the emergency department for evaluation of shortness of breath, worse with exertion along with some associated chest tightness.  Patient states that since his heart attack in April he has had near constant pain in a bandlike distribution across his chest which is unchanged.  Several days ago, he developed shortness of breath, worse with exertion.  He noticed that yesterday he went to bring the trash cans up to the house and became very short of breath.  Chest pain did not worsen.  Denies pleuritic pain.  No fevers or chills.  Denies leg swelling.  He tells me that over the past 6 months he stopped taking all medications.  He notes that he has been feeling some depression regarding his medical situation as well as other world events and "I just don't see the point anymore." Denies any active plan to harm himself or others.    Past Medical History:  Diagnosis Date   Anxiety    Cataract    beginnings    Chronic kidney disease 04/23/15   ARF- "resolved"   Colon polyp    COPD (chronic obstructive pulmonary disease) ([place])    per pt from smoking    Dehydration 04/2015   Hospitalized for 4 days after abnormal labs found during preop appointment.   Depression    Diabetes mellitus without complication ([place])    E9F 04/24/15- diet controlled    Diverticulosis    Hyperlipidemia    Hypertension    Tobacco abuse     Patient Active Problem List   Diagnosis Date Noted   CAD (coronary artery disease) 12/03/2020   ACS (acute coronary syndrome) ([place]) 11/27/2020   NSTEMI (non-ST elevated myocardial infarction) ([place]) 11/27/2020   Burning pain 01/14/2020   TFCC (triangular fibrocartilage complex) injury, left, initial encounter 09/05/2017   Polyarthropathy 09/05/2017    Arthritis of left wrist 08/05/2017   Left wrist pain 07/08/2017   S/P lumbar spinal fusion 05/14/2015   Alcohol abuse 04/23/2015   DM (diabetes mellitus) type II controlled with renal manifestation ([place]) 04/23/2015   Lumbar radiculopathy 01/23/2015   CARPAL TUNNEL SYNDROME 12/22/2009   HYPERPLASIA PROSTATE UNS W/O UR OBST & OTH LUTS 12/22/2009   DIVERTICULOSIS, COLON 10/03/2008   COLONIC POLYPS, HX OF 10/03/2008   Hyperlipidemia 08/02/2007   Tobacco dependence due to cigarettes 02/06/2007   Essential hypertension 02/06/2007    Past Surgical History:  Procedure Laterality Date   COLONOSCOPY     colonoscopy with polypectomy     polyp X 1   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   LEFT HEART CATH AND CORONARY ANGIOGRAPHY N/A 11/27/2020   Procedure: LEFT HEART CATH AND CORONARY ANGIOGRAPHY;  Surgeon: Lorretta Harp, MD;  Location: [facility];  Service: Cardiovascular;  Laterality: N/A;   MAXIMUM ACCESS (MAS)POSTERIOR LUMBAR INTERBODY FUSION (PLIF) 2 LEVEL N/A 05/14/2015   Procedure: Lumbar three-lumbar four,  umbar four-lumbar five Maximum access posterior lumbar interbody fusion;  Surgeon: Eustace Moore, MD;  Location: [facility];  Service: Neurosurgery;  Laterality: N/A;  L3-4 L4-5 Maximum access posterior lumbar interbody fusion   POLYPECTOMY     WISDOM TOOTH EXTRACTION      Allergies Bupropion  hcl, Penicillins, and Amoxicillin  Family History  Problem Relation Age of Onset   Stroke Father    Melanoma Father    Stomach cancer Father    Alcohol abuse Father    Diabetes Mother    Diabetes Brother    Breast cancer Sister    Colon cancer Neg Hx    Esophageal cancer Neg Hx    Rectal cancer Neg Hx    Colon polyps Neg Hx     Social History Social History   Tobacco Use   Smoking status: Every Day    Packs/day: 1.50    Years: 40.00    Pack years: 60.00    Types: Cigarettes    Last attempt to quit: 04/19/2015    Years since quitting: 6.3    Smokeless tobacco: Never  Vaping Use   Vaping Use: Never used  Substance Use Topics   Alcohol use: Yes    Alcohol/week: 3.0 standard drinks    Types: 3 Cans of beer per week    Comment: daily drinks- 12 pack on the weekends case/weekend,    Drug use: No    Review of Systems  Constitutional: No fever/chills Eyes: No visual changes. ENT: No sore throat. Cardiovascular: Positive chest pain (chronic). Respiratory: Positive shortness of breath. Gastrointestinal: No abdominal pain.  No nausea, no vomiting.  No diarrhea.  No constipation. Genitourinary: Negative for dysuria. Musculoskeletal: Negative for back pain. Skin: Negative for rash. Neurological: Negative for headaches, focal weakness or numbness.  10-point ROS otherwise negative.  ____________________________________________   PHYSICAL EXAM:  VITAL SIGNS: ED Triage Vitals  Enc Vitals Group     BP 08/05/21 1643 (!) 149/92     Pulse Rate 08/05/21 1643 98     Resp 08/05/21 1643 18     Temp 08/05/21 1643 99.8 F (37.7 C)     Temp src --      SpO2 08/05/21 1643 95 %     Weight 08/05/21 1636 176 lb 5.9 oz (80 kg)     Height 08/05/21 1636 6' (1.829 m)   Constitutional: Alert and oriented. Well appearing and in no acute distress. Eyes: Conjunctivae are normal.  Head: Atraumatic. Nose: No congestion/rhinnorhea. Mouth/Throat: Mucous membranes are moist.   Neck: No stridor.   Cardiovascular: Normal rate, regular rhythm. Good peripheral circulation. Grossly normal heart sounds.   Respiratory: Some increased respiratory effort.  No retractions. Lungs mostly clear with occasional end-expiratory wheeze.  Gastrointestinal: Soft and nontender. No distention.  Musculoskeletal: No lower extremity tenderness nor edema. No gross deformities of extremities. Neurologic:  Normal speech and language. No gross focal neurologic deficits are appreciated.  Skin:  Skin is warm, dry and intact. No rash  noted.   ____________________________________________   LABS (all labs ordered are listed, but only abnormal results are displayed)  Labs Reviewed  RESP PANEL BY RT-PCR (FLU A&B, COVID) ARPGX2 - Abnormal; Notable for the following components:      Result Value   Influenza A by PCR POSITIVE (*)    All other components within normal limits  BASIC METABOLIC PANEL - Abnormal; Notable for the following components:   Sodium 134 (*)    Glucose, Bld 119 (*)    Calcium 8.8 (*)    All other components within normal limits  CBC WITH DIFFERENTIAL/PLATELET - Abnormal; Notable for the following components:   Hemoglobin 17.4 (*)    All other components within normal limits  D-DIMER, QUANTITATIVE - Abnormal; Notable for the following components:   D-Dimer, Quant  0.68 (*)    All other components within normal limits  BRAIN NATRIURETIC PEPTIDE  TROPONIN I (HIGH SENSITIVITY)  TROPONIN I (HIGH SENSITIVITY)   ____________________________________________  EKG   EKG Interpretation  Date/Time:  Thursday August 06 2021 01:32:25 EST Ventricular Rate:  75 PR Interval:  157 QRS Duration: 95 QT Interval:  393 QTC Calculation: 439 R Axis:   -36 Text Interpretation: Sinus rhythm Left axis deviation Nonspecific T abnormalities, lateral leads Confirmed by Nanda Quinton [phone]) on 08/06/2021 1:44:50 AM        ____________________________________________  RADIOLOGY  DG Chest 2 View  Result Date: 08/05/2021 CLINICAL DATA:  Chest pain, shortness of breath. EXAM: CHEST - 2 VIEW COMPARISON:  03/16/2021 and 11/27/2020 FINDINGS: Slightly coarse lung markings appear to be chronic. No focal airspace disease or lung consolidation. Heart and mediastinum are within normal limits. No pleural effusions. Chronic vertebral body compression deformity near the thoracolumbar junction. No acute bone abnormality. IMPRESSION: No active cardiopulmonary disease. Electronically Signed   By: Markus Daft M.D.   On:  08/05/2021 17:11   CT Angio Chest PE W and/or Wo Contrast  Result Date: 08/06/2021 CLINICAL DATA:  Positive D-dimer, chest pain and shortness of breath. EXAM: CT ANGIOGRAPHY CHEST WITH CONTRAST TECHNIQUE: Multidetector CT imaging of the chest was performed using the standard protocol during bolus administration of intravenous contrast. Multiplanar CT image reconstructions and MIPs were obtained to evaluate the vascular anatomy. CONTRAST:  39mL OMNIPAQUE IOHEXOL 350 MG/ML SOLN COMPARISON:  PA and lateral chest yesterday, PA and lateral chest 03/16/2021. no prior chest CT. FINDINGS: Cardiovascular: The pulmonary arteries are normal in caliber without evidence of thromboemboli. The cardiac size is normal. There is patchy, moderately heavy three-vessel calcific CAD, mild aortic atherosclerosis with ectatic ascending aorta measuring 3.8 cm, aortic root 3.6 cm, mild descending segment tortuosity and normal great vessel branching opacification with trace calcification in the left subclavian artery. There is no aortic aneurysm or dissection. There is no pericardial effusion and no venous dilatation. Mediastinum/Nodes: There are mildly prominent right paratracheal nodes up to 1 cm in short axis, additional mildly prominent precarinal nodes up to 1.3 cm in short axis with AP window and subcarinal nodes up to 1.1 cm in short axis and hilar lymph nodes bilaterally up to 1.2 cm in short axis. There is no bulky or encasing adenopathy. No thyroid or axillary mass is seen. The esophagus and tracheal air caliber unremarkable. Lungs/Pleura: The lungs hyperinflated with early centrilobular emphysematous changes in lung apices. There are thickened bronchi in the upper and lower lobes. Bronchiolar impaction is seen in some of the thickened lower lobe small airways. There are ground-glass opacities in the medial right middle lobe, in the infrahilar and medial areas of lingula and could represent pneumonitis or micro atelectasis.  There is a 1.5 cm opacity in the lingular base which is probably focal atelectasis but does not show air bronchograms the fourth nodule is not excluded. Small pneumonia could also be present. The remaining lungs and central airways are clear. There is no pleural effusion or pneumothorax. Upper Abdomen: No acute abnormality. Musculoskeletal: There is osteopenia, mild chronic anterior wedging of the T7, T8, T9, T10, T11, T12 and L1 vertebral bodies, spondylosis, degenerative disc disease and endplate irregularities. Review of the MIP images confirms the above findings. IMPRESSION: 1. No appreciable arterial embolus or dilatation. No secondary signs of right heart strain. 2. Aortic and coronary artery atherosclerosis with ectasia of the aortic root and ascending segment measuring 3.6 cm  and 3.8 cm respectively. 3. Mildly prominent mediastinal and hilar lymph nodes. No bulky or encasing adenopathy. 4. Hyperinflated with early centrilobular emphysema in the apices, thickened bronchi centrally consistent with bronchitis or reactive airways disease, and bronchiolar impaction in some of the thickened lower lobe small airways as well as ground-glass opacities in the right middle lobe and lingula which could be microatelectasis or pneumonitis. Three-month follow-up study recommended. 5. 1.5 cm lingular base opacity which could be due to atelectasis, a small pneumonia, or a nodule. PET-CT or three-month follow-up CT recommended. Electronically Signed   By: Telford Nab M.D.   On: 08/06/2021 04:01    ____________________________________________   PROCEDURES  Procedure(s) performed:   Procedures  None  ____________________________________________   INITIAL IMPRESSION / ASSESSMENT AND PLAN / ED COURSE  Pertinent labs & imaging results that were available during my care of the patient were reviewed by me and considered in my medical decision making (see chart for details).   Patient presents to the emergency  department with shortness of breath worsening over the past several days.  He describes more chronic pain since his NSTEMI in April of this year.  He does describe some depression prompting him to stop taking all of his medications.  He denies any active suicidal or homicidal ideation.  Symptoms likely related to COPD with some wheezing appreciated and Markeshia Giebel smoking history.  He does not appear particularly volume overloaded to suggest new CHF.  Chest pain is chronic and EKG is nonischemic here.  Initial troponin is normal.  Have added on a D-dimer with consideration of PE and will follow second troponin and give a DuoNeb here. COVID and Flu testing also sent.   03:00 AM  Patient's d-dimer is mildly elevated. COVID is negative but Flu A is positive. Will ask TTS to evaluate with patient's worsening depression and passive SI. CTA ordered.   04:00 AM  Reviewed CTA Chest. No PE or pneumonia. Pneumonitis likely 2/2 Flu. Patient medically clear for TTS evaluation. He is here voluntarily. Patient approaching 6 days of flu symptoms. He is outside the window to benefit from antivirals.   Care transferred to Dr. Tamera Punt pending TTS evaluation.  ____________________________________________  FINAL CLINICAL IMPRESSION(S) / ED DIAGNOSES  Final diagnoses:  Precordial chest pain  Influenza A  Depression, unspecified depression type    MEDICATIONS GIVEN DURING THIS VISIT:  Medications  ipratropium-albuterol (DUONEB) 0.5-2.5 (3) MG/3ML nebulizer solution 3 mL (3 mLs Nebulization Given 08/06/21 0145)  iohexol (OMNIPAQUE) 350 MG/ML injection 61 mL (61 mLs Intravenous Contrast Given 08/06/21 0335)    Note:  This document was prepared using Dragon voice recognition software and may include unintentional dictation errors.  Nanda Quinton, MD, [facility] Emergency Medicine    Farrel Guimond, [facility], MD 08/06/21 [phone]

## 2021-08-06 NOTE — ED Notes (Signed)
Pt verbalized understanding of d/c instructions, meds and followup care. Denies questions. VSS, no distress noted. Steady gait to exit with all belongings.  ?

## 2021-08-06 NOTE — ED Provider Notes (Signed)
Patient care was taken over from Dr. Laverta Baltimore.  Patient presented initially with some chest tightness and shortness of breath.  He was found to have influenza.  He had work-up including troponins that showed no evidence of MI.  No evidence of PE on CT scan.  His other labs are nonconcerning.  He was awaiting a TTS evaluation as he had expressed worsening depression.  No obvious SI or plan of suicide.  TTS has seen the patient and cleared him for discharge.  He was given outpatient resources for follow-up.  I also notified that he had a pulmonary nodule on his CT scan that we will need outpatient follow-up by his PCP.  He acknowledges this.  He is out of the window for Tamiflu.  Was given symptomatic care instructions and return precautions.   Malvin Johns, MD 08/06/21 1110

## 2021-08-06 NOTE — ED Notes (Signed)
Patients sats started at 94% while laying, dropping to 92% with sitting. While ambulating, O2 stayed around 90-93% and pulse at 97. When returned to bed, O2 returned to 95% and pulse at 76.

## 2021-08-06 NOTE — ED Notes (Signed)
RN rounded on pt who endorses worsening depression, has been off meds and states "what is the point?" when asked why he stopped taking meds. Pt has TTS ordered but has not been dressed out. RN clarified with Dr Laverta Baltimore who stated pt is not IVC'd and has no direct SI, so okay to have his belongings and phone. RN also clarified with charge nurse to confirm, charge RN states we will monitor pt as he denies SI and the MD will likely d/c pt after TTS.

## 2021-08-06 NOTE — ED Notes (Signed)
Per MD Long pt doesn't need to be dressed out until further evaluation from TTS.

## 2021-08-06 NOTE — BH Assessment (Signed)
Comprehensive Clinical Assessment (CCA) Screening, Triage and Referral Note  08/06/2021 Keith [name] [id number]  Disposition: Merlyn Lot, NP, patient is psych-cleared with resources  Chief Complaint: Per chart review:Keith [name] is a 65 y.o. male with PMH of CAD, COPD, and HLD who presents to the emergency department for evaluation of shortness of breath, worse with exertion along with some associated chest tightness.  Patient states that since his heart attack in April he has had near constant pain in a bandlike distribution across his chest which is unchanged.  Several days ago, he developed shortness of breath, worse with exertion.  He noticed that yesterday he went to bring the trash cans up to the house and became very short of breath.  Chest pain did not worsen.  Denies pleuritic pain.  No fevers or chills.  Denies leg swelling.  He tells me that over the past 6 months he stopped taking all medications.  He notes that he has been feeling some depression regarding his medical situation as well as other world events and "I just don't see the point anymore." Denies any active plan to harm himself or others.    TTS assessmented  patient who reports lives with wife. When asked about his mental health patient reported, "I am a gruppy old man. I mean everyone complains about mental health." When asked if he had feelings of depression patient reported, "A little bit at times more than others." Denied suicidal/homicidal, denied audiory/visual hallucinations. Report at times has feelings of depression but denied he wants to kill himself. When asked about the statement, "I just don't see the point anymore." Patient stated what's the point, I am 65 year old, retired, and the grand kids does not have time for [place] no more. When asked to classify the statement patient stated I put my faith in a higher point, when I go I go. I don't see the point in taking medication and prolonging it. I see it by God's well. I  am not going to go out and kill myself.    Chief Complaint  Patient presents with   Chest Pain   Visit Diagnosis: Depression   Patient Reported Information How did you hear about [place]? No data recorded  What Is the Reason for Your Visit/Call Today? Passive suicidal ideations with mixed depressed mood.     How Long Has This Been Causing You Problems? <Week  What Do You Feel Would Help You the Most Today? Treatment for Depression or other mood problem   Have You Recently Had Any Thoughts About Hurting Yourself? No  Are You Planning to Commit Suicide/Harm Yourself At This time? No   Have you Recently Had Thoughts About [place]? No  Are You Planning to Harm Someone at This Time? No  Explanation: No data recorded  Have You Used Any Alcohol or Drugs in the Past 24 Hours? No  How Long Ago Did You Use Drugs or Alcohol? No data recorded What Did You Use and How Much? No data recorded  Do You Currently Have a Therapist/Psychiatrist? No  Name of Therapist/Psychiatrist: No data recorded  Have You Been Recently Discharged From Any Office Practice or Programs? No data recorded Explanation of Discharge From Practice/Program: No data recorded   CCA Screening Triage Referral Assessment Type of Contact: Tele-Assessment  Telemedicine Service Delivery: Telemedicine service delivery: This service was provided via telemedicine using a 2-way, interactive audio and video technology  Is this Initial or Reassessment? Initial Assessment  Date Telepsych consult ordered  in [facility]:  08/06/21  Time Telepsych consult ordered in [facility]:  0302  Location of Assessment: [facility] ED  Provider Location: [facility] Assessment Services   Collateral Involvement: wife   Does Patient Have a [place]? No data recorded Name and Contact of Legal Guardian: No data recorded If Minor and Not Living with Parent(s), Who has Custody? No data recorded Is CPS involved or ever been  involved? Never  Is APS involved or ever been involved? No data recorded  Patient Determined To Be At Risk for Harm To Self or Others Based on Review of Patient Reported Information or Presenting Complaint? No  Method: No data recorded Availability of Means: No data recorded Intent: No data recorded Notification Required: No data recorded Additional Information for Danger to Others Potential: No data recorded Additional Comments for Danger to Others Potential: No data recorded Are There Guns or Other Weapons in Your Home? No data recorded Types of Guns/Weapons: No data recorded Are These Weapons Safely Secured?                            No data recorded Who Could Verify You Are Able To Have These Secured: No data recorded Do You Have any Outstanding Charges, Pending Court Dates, Parole/Probation? No data recorded Contacted To Inform of Risk of Harm To Self or Others: No data recorded  Does Patient Present under Involuntary Commitment? No  IVC Papers Initial File Date: No data recorded  [place] of Residence: [place]   Patient Currently Receiving the Following Services: Not Receiving Services   Determination of Need: Routine (7 days)   Options For Referral: Outpatient Therapy   Discharge Disposition:     Keith [name], LCAS

## 2021-09-09 ENCOUNTER — Other Ambulatory Visit: Payer: Self-pay | Admitting: Internal Medicine

## 2021-09-10 ENCOUNTER — Telehealth: Payer: Self-pay | Admitting: Internal Medicine

## 2021-09-10 NOTE — Telephone Encounter (Signed)
Message left for patient that he is past due for appointment. If he calls back please schedule him for an appointment.  He must be seen every so often for Dr. Quay Burow to continue prescribe pain medication.

## 2021-09-10 NOTE — Telephone Encounter (Signed)
Pt states he's normally prescribed 60 traMADol (ULTRAM) 50 MG tablet  Pt states he is now prescribed 20, pt is requesting a call back regarding why the medication quantity has change

## 2021-09-15 ENCOUNTER — Encounter: Payer: Self-pay | Admitting: Internal Medicine

## 2021-09-15 NOTE — Patient Instructions (Addendum)
°  Blood work was ordered.     Medications changes include :   telmisartan and metoprolol for your BP  Your prescription(s) have been submitted to your pharmacy. Please take as directed and contact our office if you believe you are having problem(s) with the medication(s).   Ct of chest ordered.     Please followup in 6 months

## 2021-09-15 NOTE — Progress Notes (Signed)
Subjective:    Patient ID: Keith [name], male    DOB: May 28, 1956, 66 y.o.   MRN: [id number]  This visit occurred during the SARS-CoV-2 public health emergency.  Safety protocols were in place, including screening questions prior to the visit, additional usage of staff PPE, and extensive cleaning of exam room while observing appropriate contact time as indicated for disinfecting solutions.     HPI The patient is here for follow up of their chronic medical problems, including DM, CAD, HTN, HLD, lumbar radiculopathy-chronic, smoking, depression  He is taking tramadol as needed for chronic back pain.  He has not been taking any of his other medications.  Some of them were expensive and did a lot of them he did not think that he needed.  He is working on quitting smoking - he is smoking much less.    Medications and allergies reviewed with patient and updated if appropriate.  Patient Active Problem List   Diagnosis Date Noted   CAD (coronary artery disease) 12/03/2020   ACS (acute coronary syndrome) ([place]) 11/27/2020   NSTEMI (non-ST elevated myocardial infarction) ([place]) 11/27/2020   Burning pain 01/14/2020   TFCC (triangular fibrocartilage complex) injury, left, initial encounter 09/05/2017   Polyarthropathy 09/05/2017   Arthritis of left wrist 08/05/2017   Left wrist pain 07/08/2017   S/P lumbar spinal fusion 05/14/2015   Alcohol abuse 04/23/2015   DM (diabetes mellitus) type II controlled with renal manifestation ([place]) 04/23/2015   Lumbar radiculopathy 01/23/2015   CARPAL TUNNEL SYNDROME 12/22/2009   HYPERPLASIA PROSTATE UNS W/O UR OBST & OTH LUTS 12/22/2009   DIVERTICULOSIS, COLON 10/03/2008   COLONIC POLYPS, HX OF 10/03/2008   Hyperlipidemia 08/02/2007   Tobacco dependence due to cigarettes 02/06/2007   Essential hypertension 02/06/2007    Current Outpatient Medications on File Prior to Visit  Medication Sig Dispense Refill   traMADol (ULTRAM) 50 MG tablet TAKE 1  TABLET BY MOUTH EVERY 12 HOURS AS NEEDED FOR  MODERATE  PAIN  APPOINTMENT  NEEDED  FOR  ADDITIONAL  REFILLS 20 tablet 0   aspirin EC 81 MG EC tablet Take 1 tablet (81 mg total) by mouth daily. Swallow whole. (Patient not taking: Reported on 03/16/2021) 90 tablet 3   atorvastatin (LIPITOR) 80 MG tablet Take 1 tablet (80 mg total) by mouth daily. (Patient not taking: Reported on 03/16/2021) 30 tablet 6   clopidogrel (PLAVIX) 75 MG tablet Take 1 tablet (75 mg total) by mouth daily with breakfast. (Patient not taking: Reported on 03/16/2021) 30 tablet 6   hydrALAZINE (APRESOLINE) 50 MG tablet Take 2 tablets (100 mg total) by mouth 2 (two) times daily. (Patient not taking: Reported on 03/16/2021) 360 tablet 0   isosorbide mononitrate (IMDUR) 30 MG 24 hr tablet Take 1 tablet (30 mg total) by mouth daily. (Patient not taking: Reported on 03/16/2021) 90 tablet 3   losartan-hydrochlorothiazide (HYZAAR) 100-25 MG tablet Take 1 tablet by mouth daily. (Patient not taking: Reported on 03/16/2021)     metoprolol tartrate (LOPRESSOR) 25 MG tablet Take 1 tablet (25 mg total) by mouth 2 (two) times daily. (Patient not taking: Reported on 03/16/2021) 180 tablet 0   No current facility-administered medications on file prior to visit.    Past Medical History:  Diagnosis Date   Anxiety    Cataract    beginnings    Chronic kidney disease 04/23/15   ARF- "resolved"   Colon polyp    COPD (chronic obstructive pulmonary disease) ([place])  per pt from smoking    Dehydration 04/2015   Hospitalized for 4 days after abnormal labs found during preop appointment.   Depression    Diabetes mellitus without complication ([place])    P8E 04/24/15- diet controlled    Diverticulosis    Hyperlipidemia    Hypertension    Tobacco abuse     Past Surgical History:  Procedure Laterality Date   COLONOSCOPY     colonoscopy with polypectomy     polyp X 1   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   LEFT HEART CATH AND CORONARY  ANGIOGRAPHY N/A 11/27/2020   Procedure: LEFT HEART CATH AND CORONARY ANGIOGRAPHY;  Surgeon: Lorretta Harp, MD;  Location: [facility];  Service: Cardiovascular;  Laterality: N/A;   MAXIMUM ACCESS (MAS)POSTERIOR LUMBAR INTERBODY FUSION (PLIF) 2 LEVEL N/A 05/14/2015   Procedure: Lumbar three-lumbar four,  umbar four-lumbar five Maximum access posterior lumbar interbody fusion;  Surgeon: Eustace Moore, MD;  Location: [facility];  Service: Neurosurgery;  Laterality: N/A;  L3-4 L4-5 Maximum access posterior lumbar interbody fusion   POLYPECTOMY     WISDOM TOOTH EXTRACTION      Social History   Socioeconomic History   Marital status: Married    Spouse name: Not on file   Number of children: Not on file   Years of education: Not on file   Highest education level: Not on file  Occupational History   Not on file  Tobacco Use   Smoking status: Every Day    Packs/day: 1.50    Years: 40.00    Pack years: 60.00    Types: Cigarettes    Last attempt to quit: 04/19/2015    Years since quitting: 6.4   Smokeless tobacco: Never  Vaping Use   Vaping Use: Never used  Substance and Sexual Activity   Alcohol use: Yes    Alcohol/week: 3.0 standard drinks    Types: 3 Cans of beer per week    Comment: daily drinks- 12 pack on the weekends case/weekend,    Drug use: No   Sexual activity: Not on file  Other Topics Concern   Not on file  Social History Narrative   Not on file   Social Determinants of Health   Financial Resource Strain: Not on file  Food Insecurity: No Food Insecurity   Worried About Running Out of Food in the Last Year: Never true   Ran Out of Food in the Last Year: Never true  Transportation Needs: No Transportation Needs   Lack of Transportation (Medical): No   Lack of Transportation (Non-Medical): No  Physical Activity: Not on file  Stress: Stress Concern Present   Feeling of Stress : Very much  Social Connections: Not on file    Family History  Problem  Relation Age of Onset   Stroke Father    Melanoma Father    Stomach cancer Father    Alcohol abuse Father    Diabetes Mother    Diabetes Brother    Breast cancer Sister    Colon cancer Neg Hx    Esophageal cancer Neg Hx    Rectal cancer Neg Hx    Colon polyps Neg Hx     Review of Systems  Constitutional:  Negative for fever.  Respiratory:  Positive for cough and wheezing. Negative for shortness of breath.   Cardiovascular:  Positive for chest pain (occ with deep breaths). Negative for palpitations and leg  swelling.  Musculoskeletal:  Positive for back pain.  Neurological:  Negative for light-headedness and headaches.      Objective:   Vitals:   09/16/21 1100  BP: 140/84  Pulse: 75  Temp: 98.2 F (36.8 C)  SpO2: 98%   BP Readings from Last 3 Encounters:  09/16/21 140/84  08/06/21 (!) 142/88  08/05/21 (!) 159/109   Wt Readings from Last 3 Encounters:  09/16/21 174 lb 6.4 oz (79.1 kg)  08/05/21 176 lb 5.9 oz (80 kg)  03/26/21 176 lb 6.4 oz (80 kg)   Body mass index is 23.65 kg/m.   Physical Exam    Constitutional: Appears well-developed and well-nourished. No distress.  HENT:  Head: Normocephalic and atraumatic.  Neck: Neck supple. No tracheal deviation present. No thyromegaly present.  No cervical lymphadenopathy Cardiovascular: Normal rate, regular rhythm and normal heart.   No murmur heard. No carotid bruit .  No edema Pulmonary/Chest: Effort normal with diffusely decreased which is chronic.  No respiratory distress. No has no wheezes. No rales.  Skin: Skin is warm and dry. Not diaphoretic.  Psychiatric: Normal mood and affect. Behavior is normal.      Assessment & Plan:    See Problem List for Assessment and Plan of chronic medical problems.

## 2021-09-16 ENCOUNTER — Encounter: Payer: Self-pay | Admitting: Internal Medicine

## 2021-09-16 ENCOUNTER — Other Ambulatory Visit: Payer: Self-pay

## 2021-09-16 ENCOUNTER — Ambulatory Visit (INDEPENDENT_AMBULATORY_CARE_PROVIDER_SITE_OTHER): Payer: Medicare Other | Admitting: Internal Medicine

## 2021-09-16 VITALS — BP 140/84 | HR 75 | Temp 98.2°F | Ht 72.0 in | Wt 174.4 lb

## 2021-09-16 DIAGNOSIS — F1010 Alcohol abuse, uncomplicated: Secondary | ICD-10-CM

## 2021-09-16 DIAGNOSIS — F17210 Nicotine dependence, cigarettes, uncomplicated: Secondary | ICD-10-CM | POA: Diagnosis not present

## 2021-09-16 DIAGNOSIS — E7849 Other hyperlipidemia: Secondary | ICD-10-CM | POA: Diagnosis not present

## 2021-09-16 DIAGNOSIS — M5416 Radiculopathy, lumbar region: Secondary | ICD-10-CM

## 2021-09-16 DIAGNOSIS — R911 Solitary pulmonary nodule: Secondary | ICD-10-CM | POA: Diagnosis not present

## 2021-09-16 DIAGNOSIS — I2510 Atherosclerotic heart disease of native coronary artery without angina pectoris: Secondary | ICD-10-CM | POA: Diagnosis not present

## 2021-09-16 DIAGNOSIS — E1122 Type 2 diabetes mellitus with diabetic chronic kidney disease: Secondary | ICD-10-CM | POA: Diagnosis not present

## 2021-09-16 DIAGNOSIS — I10 Essential (primary) hypertension: Secondary | ICD-10-CM | POA: Diagnosis not present

## 2021-09-16 LAB — CBC WITH DIFFERENTIAL/PLATELET
Basophils Absolute: 0 10*3/uL (ref 0.0–0.1)
Basophils Relative: 0.5 % (ref 0.0–3.0)
Eosinophils Absolute: 0.4 10*3/uL (ref 0.0–0.7)
Eosinophils Relative: 5.1 % — ABNORMAL HIGH (ref 0.0–5.0)
HCT: 47.7 % (ref 39.0–52.0)
Hemoglobin: 15.9 g/dL (ref 13.0–17.0)
Lymphocytes Relative: 27.2 % (ref 12.0–46.0)
Lymphs Abs: 2 10*3/uL (ref 0.7–4.0)
MCHC: 33.4 g/dL (ref 30.0–36.0)
MCV: 94.7 fl (ref 78.0–100.0)
Monocytes Absolute: 0.7 10*3/uL (ref 0.1–1.0)
Monocytes Relative: 9.3 % (ref 3.0–12.0)
Neutro Abs: 4.3 10*3/uL (ref 1.4–7.7)
Neutrophils Relative %: 57.9 % (ref 43.0–77.0)
Platelets: 240 10*3/uL (ref 150.0–400.0)
RBC: 5.03 Mil/uL (ref 4.22–5.81)
RDW: 14.9 % (ref 11.5–15.5)
WBC: 7.4 10*3/uL (ref 4.0–10.5)

## 2021-09-16 LAB — HEMOGLOBIN A1C: Hgb A1c MFr Bld: 6.3 % (ref 4.6–6.5)

## 2021-09-16 MED ORDER — ASPIRIN 81 MG PO TBEC: 81.0000 mg | DELAYED_RELEASE_TABLET | Freq: Every day | ORAL | 3 refills | Status: DC

## 2021-09-16 MED ORDER — CLOPIDOGREL BISULFATE 75 MG PO TABS
75.0000 mg | ORAL_TABLET | Freq: Every day | ORAL | 2 refills | Status: DC
Start: 2021-09-16 — End: 2022-01-18

## 2021-09-16 MED ORDER — ATORVASTATIN CALCIUM 80 MG PO TABS
80.0000 mg | ORAL_TABLET | Freq: Every day | ORAL | 2 refills | Status: DC
Start: 2021-09-16 — End: 2022-01-18

## 2021-09-16 MED ORDER — METOPROLOL SUCCINATE ER 25 MG PO TB24: 25.0000 mg | ORAL_TABLET | Freq: Every day | ORAL | 3 refills | Status: DC

## 2021-09-16 MED ORDER — TELMISARTAN 20 MG PO TABS: 20.0000 mg | ORAL_TABLET | Freq: Every day | ORAL | 2 refills | Status: DC

## 2021-09-16 NOTE — Assessment & Plan Note (Signed)
Chronic Blood pressure slightly elevated here today He has not been taking any blood pressure medications We will simplify his regimen He is working on quitting smoking and has significantly decreased how much he is smoking which may help Advise monitoring his blood pressure at home Start metoprolol XL 25 mg daily, telmisartan 20 mg daily Discontinue clonidine, hydralazine, Imdur, Hyzaar and Lopressor CMP

## 2021-09-16 NOTE — Assessment & Plan Note (Signed)
Chronic Continue tramadol 50 mg every 12 hours as needed for moderate-severe pain

## 2021-09-16 NOTE — Assessment & Plan Note (Signed)
Chronic He is working on quitting smoking and is smoking much less than he used to He is determined to quit completely

## 2021-09-16 NOTE — Assessment & Plan Note (Signed)
Chronic History of NSTEMI Currently not taking any medications-discussed the increased risk of another heart attack or even stroke Restart aspirin 81 mg daily, Plavix 75 mg daily, atorvastatin 80 mg daily, metoprolol and will start telmisartan-discontinue other medications for now Monitor blood pressure Continue smoking cessation efforts

## 2021-09-16 NOTE — Assessment & Plan Note (Signed)
New Lung nodule seen on CT scan 2 months ago He is an active smoker and has smoked for years Concern for possible lung cancer Follow-up CT scan ordered-due next month

## 2021-09-16 NOTE — Assessment & Plan Note (Signed)
Chronic Regular exercise and healthy diet encouraged Check lipid panel  Has not been taking any medication-restart atorvastatin 80 mg daily

## 2021-09-16 NOTE — Assessment & Plan Note (Signed)
Chronic Diet controlled Check A1c  

## 2021-09-17 LAB — COMPREHENSIVE METABOLIC PANEL
ALT: 10 U/L (ref 0–53)
AST: 14 U/L (ref 0–37)
Albumin: 4.1 g/dL (ref 3.5–5.2)
Alkaline Phosphatase: 64 U/L (ref 39–117)
BUN: 16 mg/dL (ref 6–23)
CO2: 36 mEq/L — ABNORMAL HIGH (ref 19–32)
Calcium: 9.6 mg/dL (ref 8.4–10.5)
Chloride: 100 mEq/L (ref 96–112)
Creatinine, Ser: 1.13 mg/dL (ref 0.40–1.50)
GFR: 67.16 mL/min (ref >60.00)
Glucose, Bld: 105 mg/dL — ABNORMAL HIGH (ref 70–99)
Potassium: 4.9 mEq/L (ref 3.5–5.1)
Sodium: 138 mEq/L (ref 135–145)
Total Bilirubin: 0.6 mg/dL (ref 0.2–1.2)
Total Protein: 7.6 g/dL (ref 6.0–8.3)

## 2021-09-17 LAB — LIPID PANEL
Cholesterol: 194 mg/dL (ref 0–200)
HDL: 52.7 mg/dL (ref >39.00)
LDL Cholesterol: 117 mg/dL — ABNORMAL HIGH (ref 0–99)
NonHDL: 141.7
Total CHOL/HDL Ratio: 4
Triglycerides: 125 mg/dL (ref 0.0–149.0)
VLDL: 25 mg/dL (ref 0.0–40.0)

## 2021-09-30 ENCOUNTER — Other Ambulatory Visit: Payer: Self-pay | Admitting: Internal Medicine

## 2021-11-06 ENCOUNTER — Encounter (HOSPITAL_COMMUNITY): Payer: Self-pay | Admitting: Radiology

## 2022-01-17 ENCOUNTER — Encounter: Payer: Self-pay | Admitting: Internal Medicine

## 2022-01-17 NOTE — Progress Notes (Unsigned)
Subjective:    Patient ID: Keith [name], male    DOB: [birth year], 66 y.o.   MRN: [id number]     HPI Keith [name] is here for follow up of his chronic medical problems, including DM, CAD, htn, hld, lumbar radiculopathy - chronic, smoking, depression  He is taking his tramadol as needed when he does yard work.  He is not taking any other medications.    Medications and allergies reviewed with patient and updated if appropriate.  Current Outpatient Medications on File Prior to Visit  Medication Sig Dispense Refill   aspirin 81 MG EC tablet Take 1 tablet (81 mg total) by mouth daily. Swallow whole. 90 tablet 3   atorvastatin (LIPITOR) 80 MG tablet Take 1 tablet (80 mg total) by mouth daily. 90 tablet 2   clopidogrel (PLAVIX) 75 MG tablet Take 1 tablet (75 mg total) by mouth daily with breakfast. 90 tablet 2   metoprolol succinate (TOPROL-XL) 25 MG 24 hr tablet Take 1 tablet (25 mg total) by mouth daily. 90 tablet 3   telmisartan (MICARDIS) 20 MG tablet Take 1 tablet (20 mg total) by mouth daily. 90 tablet 2   No current facility-administered medications on file prior to visit.     Review of Systems  Constitutional:  Negative for fever.  Respiratory:  Positive for shortness of breath (with moderate exetion). Negative for cough and wheezing.   Cardiovascular:  Positive for chest pain (similar to what he felt prior to his heart attack). Negative for palpitations and leg swelling.  Neurological:  Negative for light-headedness and headaches.  Psychiatric/Behavioral:  Positive for dysphoric mood.        Lack of motivation       Objective:   Vitals:   01/18/22 1530  BP: 138/84  Pulse: 74  Temp: 98.2 F (36.8 C)  SpO2: 95%   BP Readings from Last 3 Encounters:  01/18/22 138/84  09/16/21 140/84  08/06/21 (!) 142/88   Wt Readings from Last 3 Encounters:  01/18/22 165 lb 12.8 oz (75.2 kg)  09/16/21 174 lb 6.4 oz (79.1 kg)  08/05/21 176 lb 5.9 oz (80 kg)   Body mass index  is 22.49 kg/m.    Physical Exam Constitutional:      General: He is not in acute distress.    Appearance: Normal appearance. He is not ill-appearing.  HENT:     Head: Normocephalic and atraumatic.  Eyes:     Conjunctiva/sclera: Conjunctivae normal.  Cardiovascular:     Rate and Rhythm: Normal rate and regular rhythm.     Heart sounds: Normal heart sounds. No murmur heard. Pulmonary:     Effort: Pulmonary effort is normal. No respiratory distress.     Breath sounds: Normal breath sounds. No wheezing or rales.  Musculoskeletal:     Right lower leg: No edema.     Left lower leg: No edema.  Skin:    General: Skin is warm and dry.     Findings: No rash.  Neurological:     Mental Status: He is alert. Mental status is at baseline.  Psychiatric:        Mood and Affect: Mood normal.        Lab Results  Component Value Date   WBC 7.4 09/16/2021   HGB 15.9 09/16/2021   HCT 47.7 09/16/2021   PLT 240.0 09/16/2021   GLUCOSE 105 (H) 09/16/2021   CHOL 194 09/16/2021   TRIG 125.0 09/16/2021   HDL 52.70 09/16/2021  LDLCALC 117 (H) 09/16/2021   ALT 10 09/16/2021   AST 14 09/16/2021   NA 138 09/16/2021   K 4.9 09/16/2021   CL 100 09/16/2021   CREATININE 1.13 09/16/2021   BUN 16 09/16/2021   CO2 36 (H) 09/16/2021   TSH 0.96 01/19/2021   PSA 1.10 01/19/2021   INR 0.9 11/27/2020   HGBA1C 6.3 09/16/2021   MICROALBUR 1.1 11/10/2015     Assessment & Plan:    See Problem List for Assessment and Plan of chronic medical problems.

## 2022-01-17 NOTE — Patient Instructions (Addendum)
     Blood work was ordered.     Medications changes include :   start sertraline for depression.  Restart aspirin, plavix, atorvastatin, metoprolol   Your prescription(s) have been sent to your pharmacy.    A referral was ordered for a therapist - [facility].     Someone from that office will call you to schedule an appointment.    Return in about 6 months (around 07/20/2022) for follow up.

## 2022-01-18 ENCOUNTER — Ambulatory Visit (INDEPENDENT_AMBULATORY_CARE_PROVIDER_SITE_OTHER): Payer: Medicare Other | Admitting: Internal Medicine

## 2022-01-18 VITALS — BP 138/84 | HR 74 | Temp 98.2°F | Ht 72.0 in | Wt 165.8 lb

## 2022-01-18 DIAGNOSIS — E1122 Type 2 diabetes mellitus with diabetic chronic kidney disease: Secondary | ICD-10-CM | POA: Diagnosis not present

## 2022-01-18 DIAGNOSIS — M5416 Radiculopathy, lumbar region: Secondary | ICD-10-CM | POA: Diagnosis not present

## 2022-01-18 DIAGNOSIS — E7849 Other hyperlipidemia: Secondary | ICD-10-CM

## 2022-01-18 DIAGNOSIS — I2510 Atherosclerotic heart disease of native coronary artery without angina pectoris: Secondary | ICD-10-CM | POA: Diagnosis not present

## 2022-01-18 DIAGNOSIS — I10 Essential (primary) hypertension: Secondary | ICD-10-CM | POA: Diagnosis not present

## 2022-01-18 DIAGNOSIS — F3289 Other specified depressive episodes: Secondary | ICD-10-CM

## 2022-01-18 DIAGNOSIS — Z125 Encounter for screening for malignant neoplasm of prostate: Secondary | ICD-10-CM

## 2022-01-18 MED ORDER — ASPIRIN 81 MG PO TBEC: 81.0000 mg | DELAYED_RELEASE_TABLET | Freq: Every day | ORAL | 3 refills | Status: DC

## 2022-01-18 MED ORDER — METOPROLOL SUCCINATE ER 25 MG PO TB24: 25.0000 mg | ORAL_TABLET | Freq: Every day | ORAL | 3 refills | Status: DC

## 2022-01-18 MED ORDER — SERTRALINE HCL 50 MG PO TABS: 50.0000 mg | ORAL_TABLET | Freq: Every day | ORAL | 5 refills | Status: DC

## 2022-01-18 MED ORDER — ATORVASTATIN CALCIUM 80 MG PO TABS: 80.0000 mg | ORAL_TABLET | Freq: Every day | ORAL | 2 refills | Status: DC

## 2022-01-18 MED ORDER — TRAMADOL HCL 50 MG PO TABS: 50.0000 mg | ORAL_TABLET | Freq: Two times a day (BID) | ORAL | 2 refills | Status: DC | PRN

## 2022-01-18 MED ORDER — CLOPIDOGREL BISULFATE 75 MG PO TABS: 75.0000 mg | ORAL_TABLET | Freq: Every day | ORAL | 2 refills | Status: DC

## 2022-01-18 NOTE — Assessment & Plan Note (Signed)
Chronic Restart atorvastatin 80 mg daily

## 2022-01-18 NOTE — Assessment & Plan Note (Signed)
Chronic Worse with certain activities Continue tramadol 50 mg every 12 hours as needed for moderate-severe pain-he typically takes this when he does yard work

## 2022-01-18 NOTE — Assessment & Plan Note (Signed)
Chronic Blood pressure adequately controlled here today, but probably not ideally controlled overall Not currently taking any medications Restart metoprolol XL 25 mg daily Recheck in 2 months and will adjust if needed

## 2022-01-18 NOTE — Assessment & Plan Note (Signed)
Chronic History of non-STEMI Not currently taking any medications Has had episodes of chest discomfort similar to when he had his heart attack Stressed that he needs to be taking his medications on a daily basis Restart aspirin 81 mg daily, Plavix 75 mg daily, atorvastatin 80 mg daily and metoprolol XL 25 mg daily We will hold off on the telmisartan for now since I do not think he will take a lot of medications and I want to keep it to the most important medications Stressed smoking cessation Does not want to see cardiology Follow-up in 2 months-we will check blood work then

## 2022-01-18 NOTE — Assessment & Plan Note (Signed)
Chronic Experiencing increased depression, decreased motivation He is in a very difficult marriage and that is a source of a lot of his depression He is interested in speaking with someone-referral ordered for behavioral health Agrees to try medication again-start sertraline 50 mg daily Follow-up in 2 months

## 2022-01-18 NOTE — Assessment & Plan Note (Signed)
Chronic Diet controlled Sugars have been controlled We will recheck A1c at his next visit

## 2022-02-08 ENCOUNTER — Telehealth: Payer: Self-pay | Admitting: Internal Medicine

## 2022-02-08 NOTE — Telephone Encounter (Signed)
PT calls today in regards to past visit with Dr.Burns. PT was requesting a referral out for therapy and was checking the status on this referral. I let him know that it looked like it might not have been put in but I would route this back to Dr.Burns and see if we can get this process started. I informed PT to be looking out for a phone call.

## 2022-02-08 NOTE — Telephone Encounter (Signed)
Called patient. A referral to psych was placed for patient. [facility] called patient 3x and left messages. Sent secure chat to referral coordinator at [facility] to call patient back to schedule an appt

## 2022-03-16 DIAGNOSIS — R7303 Prediabetes: Secondary | ICD-10-CM | POA: Insufficient documentation

## 2022-03-16 NOTE — Patient Instructions (Addendum)
    If you want to talk to a therapist ---> [facility] [phone]      Medications changes include :   none    Return in about 6 months (around 09/17/2022) for follow up.

## 2022-03-16 NOTE — Progress Notes (Unsigned)
Subjective:    Patient ID: Keith [name], male    DOB: 1956/06/30, 66 y.o.   MRN: [id number]     HPI Keith [name] is here for follow up of his chronic medical problems, including depression, HTN, DM- we started sertraline 2 months ago and htn - we restarted his metoprolol 2 months ago.  Also restartd ASA, plavix, statin.  He is just taking the tramadol - not taking any other medications.  He does not see the point in taking any other medications-he states he is going to die at some point and understands that not taking the medications may increase his risk of having a heart attack or stroke and make his death sooner than if he was taking the medication.  He has no motivation or ambition to do anything.  He is depressed and knows it.  Medications and allergies reviewed with patient and updated if appropriate.  Current Outpatient Medications on File Prior to Visit  Medication Sig Dispense Refill   aspirin EC 81 MG tablet Take 1 tablet (81 mg total) by mouth daily. Swallow whole. (Patient not taking: Reported on 03/17/2022) 90 tablet 3   atorvastatin (LIPITOR) 80 MG tablet Take 1 tablet (80 mg total) by mouth daily. (Patient not taking: Reported on 03/17/2022) 90 tablet 2   clopidogrel (PLAVIX) 75 MG tablet Take 1 tablet (75 mg total) by mouth daily with breakfast. (Patient not taking: Reported on 03/17/2022) 90 tablet 2   metoprolol succinate (TOPROL-XL) 25 MG 24 hr tablet Take 1 tablet (25 mg total) by mouth daily. (Patient not taking: Reported on 03/17/2022) 90 tablet 3   sertraline (ZOLOFT) 50 MG tablet Take 1 tablet (50 mg total) by mouth daily. (Patient not taking: Reported on 03/17/2022) 30 tablet 5   traMADol (ULTRAM) 50 MG tablet Take 1 tablet (50 mg total) by mouth every 12 (twelve) hours as needed. (Patient not taking: Reported on 03/17/2022) 60 tablet 2   No current facility-administered medications on file prior to visit.     Review of Systems  Constitutional:  Negative for fever.   Respiratory:  Positive for cough (occ), shortness of breath and wheezing (at night).   Cardiovascular:  Positive for leg swelling (mild at end of day). Negative for chest pain and palpitations.  Musculoskeletal:  Positive for back pain.  Neurological:  Negative for light-headedness and headaches.  Psychiatric/Behavioral:  Positive for dysphoric mood. Negative for suicidal ideas.        Objective:   Vitals:   03/17/22 1113  BP: (!) 140/86  Pulse: 73  Temp: 98.5 F (36.9 C)  SpO2: 97%   BP Readings from Last 3 Encounters:  03/17/22 (!) 140/86  01/18/22 138/84  09/16/21 140/84   Wt Readings from Last 3 Encounters:  03/17/22 163 lb (73.9 kg)  01/18/22 165 lb 12.8 oz (75.2 kg)  09/16/21 174 lb 6.4 oz (79.1 kg)   Body mass index is 22.11 kg/m.    Physical Exam Constitutional:      General: He is not in acute distress.    Appearance: Normal appearance. He is not ill-appearing.  HENT:     Head: Normocephalic and atraumatic.  Eyes:     Conjunctiva/sclera: Conjunctivae normal.  Cardiovascular:     Rate and Rhythm: Normal rate and regular rhythm.     Heart sounds: Normal heart sounds. No murmur heard. Pulmonary:     Effort: Pulmonary effort is normal. No respiratory distress.     Breath sounds: Normal breath  sounds. No wheezing or rales.  Musculoskeletal:     Right lower leg: No edema.     Left lower leg: No edema.  Skin:    General: Skin is warm and dry.     Findings: No rash.  Neurological:     Mental Status: He is alert. Mental status is at baseline.  Psychiatric:     Comments: Depressed affect        Lab Results  Component Value Date   WBC 7.4 09/16/2021   HGB 15.9 09/16/2021   HCT 47.7 09/16/2021   PLT 240.0 09/16/2021   GLUCOSE 105 (H) 09/16/2021   CHOL 194 09/16/2021   TRIG 125.0 09/16/2021   HDL 52.70 09/16/2021   LDLCALC 117 (H) 09/16/2021   ALT 10 09/16/2021   AST 14 09/16/2021   NA 138 09/16/2021   K 4.9 09/16/2021   CL 100 09/16/2021    CREATININE 1.13 09/16/2021   BUN 16 09/16/2021   CO2 36 (H) 09/16/2021   TSH 0.96 01/19/2021   PSA 1.10 01/19/2021   INR 0.9 11/27/2020   HGBA1C 6.3 09/16/2021   MICROALBUR 1.1 11/10/2015     Assessment & Plan:    See Problem List for Assessment and Plan of chronic medical problems.

## 2022-03-17 ENCOUNTER — Encounter: Payer: Self-pay | Admitting: Internal Medicine

## 2022-03-17 ENCOUNTER — Ambulatory Visit (INDEPENDENT_AMBULATORY_CARE_PROVIDER_SITE_OTHER): Payer: Medicare Other | Admitting: Internal Medicine

## 2022-03-17 VITALS — BP 140/86 | HR 73 | Temp 98.5°F | Ht 72.0 in | Wt 163.0 lb

## 2022-03-17 DIAGNOSIS — I10 Essential (primary) hypertension: Secondary | ICD-10-CM | POA: Diagnosis not present

## 2022-03-17 DIAGNOSIS — E7849 Other hyperlipidemia: Secondary | ICD-10-CM | POA: Diagnosis not present

## 2022-03-17 DIAGNOSIS — R7303 Prediabetes: Secondary | ICD-10-CM | POA: Diagnosis not present

## 2022-03-17 DIAGNOSIS — M5416 Radiculopathy, lumbar region: Secondary | ICD-10-CM | POA: Diagnosis not present

## 2022-03-17 DIAGNOSIS — F3289 Other specified depressive episodes: Secondary | ICD-10-CM | POA: Diagnosis not present

## 2022-03-17 DIAGNOSIS — E1122 Type 2 diabetes mellitus with diabetic chronic kidney disease: Secondary | ICD-10-CM | POA: Diagnosis not present

## 2022-03-17 DIAGNOSIS — I2510 Atherosclerotic heart disease of native coronary artery without angina pectoris: Secondary | ICD-10-CM

## 2022-03-17 NOTE — Assessment & Plan Note (Addendum)
Chronic Blood pressure not ideally controlled Declined taking medication

## 2022-03-17 NOTE — Assessment & Plan Note (Signed)
Chronic Worse with certain activities Continue tramadol 50 mg every 12 hours as needed for moderate-severe pain-he usually takes this when he has to do certain activities only, such as yard work

## 2022-03-17 NOTE — Assessment & Plan Note (Addendum)
chronic Not controlled He denies any suicidal ideation I did prescribe him sertraline at his last visit, but he is not taking it Referred him to a therapist-he was interested in talking to someone-he states he never heard from them although it states they did call him and left messages 3 times Number given for him to reach out to them to schedule an appointment

## 2022-03-17 NOTE — Assessment & Plan Note (Addendum)
Chronic Diet controlled Sugars have been controlled  Lab Results  Component Value Date   HGBA1C 6.3 09/16/2021

## 2022-03-17 NOTE — Assessment & Plan Note (Addendum)
Chronic History of non-ST elevation MI Does not want to follow with cardiology Not taking aspirin 81 mg daily, atorvastatin 80 mg daily, Plavix 75 mg daily, metoprolol XL 25 mg daily as recommended He does drink more alcohol than he should and is still smoking-no desire to quit He understands that not taking the above medication makes his likelihood of having a stroke or heart attack more likely

## 2022-03-17 NOTE — Assessment & Plan Note (Signed)
New Last A1c in prediabetic range Lab Results  Component Value Date   HGBA1C 6.3 09/16/2021   Advised diabetic diet and remaining active .

## 2022-03-17 NOTE — Assessment & Plan Note (Addendum)
Chronic Still smoking and drinking alcohol Not eating a healthy diet Not taking any medication-he refuses to take anything

## 2022-05-04 ENCOUNTER — Other Ambulatory Visit: Payer: Self-pay | Admitting: Internal Medicine

## 2022-05-05 DIAGNOSIS — D3132 Benign neoplasm of left choroid: Secondary | ICD-10-CM | POA: Diagnosis not present

## 2022-05-05 DIAGNOSIS — H5319 Other subjective visual disturbances: Secondary | ICD-10-CM | POA: Diagnosis not present

## 2022-05-05 DIAGNOSIS — H43813 Vitreous degeneration, bilateral: Secondary | ICD-10-CM | POA: Diagnosis not present

## 2022-05-26 DIAGNOSIS — G939 Disorder of brain, unspecified: Secondary | ICD-10-CM | POA: Diagnosis not present

## 2022-05-27 ENCOUNTER — Other Ambulatory Visit: Payer: Self-pay | Admitting: Radiation Therapy

## 2022-05-27 DIAGNOSIS — G9389 Other specified disorders of brain: Secondary | ICD-10-CM | POA: Diagnosis not present

## 2022-05-27 DIAGNOSIS — C719 Malignant neoplasm of brain, unspecified: Secondary | ICD-10-CM

## 2022-05-28 ENCOUNTER — Telehealth: Payer: Self-pay | Admitting: Internal Medicine

## 2022-05-28 ENCOUNTER — Encounter: Payer: Self-pay | Admitting: Genetic Counselor

## 2022-05-28 NOTE — Telephone Encounter (Signed)
Scheduled appt per 10/19 referral. Pt is aware of appt date and time. Pt is aware to arrive 15 mins prior to appt time and to bring and updated insurance card. Pt is aware of appt location.   

## 2022-05-31 ENCOUNTER — Inpatient Hospital Stay: Payer: Medicare Other | Attending: Neurological Surgery

## 2022-05-31 DIAGNOSIS — G9389 Other specified disorders of brain: Secondary | ICD-10-CM | POA: Insufficient documentation

## 2022-05-31 DIAGNOSIS — F17210 Nicotine dependence, cigarettes, uncomplicated: Secondary | ICD-10-CM | POA: Insufficient documentation

## 2022-05-31 DIAGNOSIS — Z79899 Other long term (current) drug therapy: Secondary | ICD-10-CM | POA: Insufficient documentation

## 2022-05-31 DIAGNOSIS — Z7982 Long term (current) use of aspirin: Secondary | ICD-10-CM | POA: Insufficient documentation

## 2022-06-01 ENCOUNTER — Inpatient Hospital Stay: Payer: Medicare Other | Admitting: Internal Medicine

## 2022-06-01 ENCOUNTER — Encounter: Payer: Self-pay | Admitting: Internal Medicine

## 2022-06-01 DIAGNOSIS — Z79899 Other long term (current) drug therapy: Secondary | ICD-10-CM

## 2022-06-01 DIAGNOSIS — Z7982 Long term (current) use of aspirin: Secondary | ICD-10-CM | POA: Diagnosis not present

## 2022-06-01 DIAGNOSIS — G9389 Other specified disorders of brain: Secondary | ICD-10-CM

## 2022-06-01 DIAGNOSIS — F17210 Nicotine dependence, cigarettes, uncomplicated: Secondary | ICD-10-CM | POA: Diagnosis not present
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
## 2022-06-03 ENCOUNTER — Other Ambulatory Visit (HOSPITAL_COMMUNITY): Payer: Self-pay | Admitting: Neurological Surgery

## 2022-06-03 ENCOUNTER — Ambulatory Visit (HOSPITAL_COMMUNITY)
Admission: RE | Admit: 2022-06-03 | Discharge: 2022-06-03 | Disposition: A | Discharge status: Home / Self Care | Payer: Medicare Other | Source: Ambulatory Visit | Attending: Neurological Surgery | Admitting: Neurological Surgery

## 2022-06-03 DIAGNOSIS — G9389 Other specified disorders of brain: Secondary | ICD-10-CM

## 2022-06-03 DIAGNOSIS — R220 Localized swelling, mass and lump, head: Secondary | ICD-10-CM | POA: Diagnosis not present

## 2022-06-03 MED ORDER — GADOBUTROL 1 MMOL/ML IV SOLN
7.5000 mL | Freq: Once | INTRAVENOUS | Status: AC | PRN
  Administered 2022-06-03: 7.5 mL via INTRAVENOUS

## 2022-06-10 DIAGNOSIS — G9389 Other specified disorders of brain: Secondary | ICD-10-CM | POA: Diagnosis not present

## 2022-06-14 ENCOUNTER — Other Ambulatory Visit: Payer: Self-pay | Admitting: Neurological Surgery

## 2022-06-15 ENCOUNTER — Other Ambulatory Visit: Payer: Self-pay | Admitting: Internal Medicine

## 2022-06-16 NOTE — Pre-Procedure Instructions (Signed)
Surgical Instructions    Your procedure is scheduled on Monday, November 13.  Report to [facility] Main Entrance "A" at 9:50 A.M., then check in with the Admitting office.  Call this number if you have problems the morning of surgery:  [phone]   If you have any questions prior to your surgery date call [phone]: Open Monday-Friday 8am-4pm If you experience any cold or flu symptoms such as cough, fever, chills, shortness of breath, etc. between now and your scheduled surgery, please notify us at the above number     Remember:  Do not eat or drink after midnight the night before your surgery   Take these medicines the morning of surgery with A SIP OF WATER:  dexamethasone (DECADRON)  traMADol (ULTRAM) 50 MG tablet if needed   As of today, STOP taking any Aspirin (unless otherwise instructed by your surgeon) Aleve, Naproxen, Ibuprofen, Motrin, Advil, Goody's, BC's, all herbal medications, fish oil, and all vitamins.  WHAT DO I DO ABOUT MY DIABETES MEDICATION?   Do not take oral diabetes medicines (pills) the morning of surgery.  The day of surgery, do not take other diabetes injectables, including Byetta (exenatide), Bydureon (exenatide ER), Victoza (liraglutide), or Trulicity (dulaglutide).  If your CBG is greater than 220 mg/dL, you may take  of your sliding scale (correction) dose of insulin.   HOW TO MANAGE YOUR DIABETES BEFORE AND AFTER SURGERY  Why is it important to control my blood sugar before and after surgery? Improving blood sugar levels before and after surgery helps healing and can limit problems. A way of improving blood sugar control is eating a healthy diet by:  Eating less sugar and carbohydrates  Increasing activity/exercise  Talking with your doctor about reaching your blood sugar goals High blood sugars (greater than 180 mg/dL) can raise your risk of infections and slow your recovery, so you will need to focus on controlling your diabetes during  the weeks before surgery. Make sure that the doctor who takes care of your diabetes knows about your planned surgery including the date and location.  How do I manage my blood sugar before surgery? Check your blood sugar at least 4 times a day, starting 2 days before surgery, to make sure that the level is not too high or low.  Check your blood sugar the morning of your surgery when you wake up and every 2 hours until you get to the Short Stay unit.  If your blood sugar is less than 70 mg/dL, you will need to treat for low blood sugar: Do not take insulin. Treat a low blood sugar (less than 70 mg/dL) with  cup of clear juice (cranberry or apple), 4 glucose tablets, OR glucose gel. Recheck blood sugar in 15 minutes after treatment (to make sure it is greater than 70 mg/dL). If your blood sugar is not greater than 70 mg/dL on recheck, call [phone] for further instructions. Report your blood sugar to the short stay nurse when you get to Short Stay.  If you are admitted to the hospital after surgery: Your blood sugar will be checked by the staff and you will probably be given insulin after surgery (instead of oral diabetes medicines) to make sure you have good blood sugar levels. The goal for blood sugar control after surgery is 80-180 mg/dL.           [facility] is not responsible for any belongings or valuables.    Do NOT Smoke (Tobacco/Vaping)  24 hours prior to  your procedure  If you use a CPAP at night, you may bring your mask for your overnight stay.   Contacts, glasses, hearing aids, dentures or partials may not be worn into surgery, please bring cases for these belongings   For patients admitted to the hospital, discharge time will be determined by your treatment team.   Patients discharged the day of surgery will not be allowed to drive home, and someone needs to stay with them for 24 hours.   SURGICAL WAITING ROOM VISITATION Patients having surgery or a procedure may  have no more than 2 support people in the waiting area - these visitors may rotate.   Children under the age of 4 must have an adult with them who is not the patient. If the patient needs to stay at the hospital during part of their recovery, the visitor guidelines for inpatient rooms apply. Pre-op nurse will coordinate an appropriate time for 1 support person to accompany patient in pre-op.  This support person may not rotate.   Please refer to [email or web address] for the visitor guidelines for Inpatients (after your surgery is over and you are in a regular room).    Special instructions:    Oral Hygiene is also important to reduce your risk of infection.  Remember - BRUSH YOUR TEETH THE MORNING OF SURGERY WITH YOUR REGULAR TOOTHPASTE   - Preparing For Surgery  Before surgery, you can play an important role. Because skin is not sterile, your skin needs to be as free of germs as possible. You can reduce the number of germs on your skin by washing with CHG (chlorahexidine gluconate) Soap before surgery.  CHG is an antiseptic cleaner which kills germs and bonds with the skin to continue killing germs even after washing.     Please do not use if you have an allergy to CHG or antibacterial soaps. If your skin becomes reddened/irritated stop using the CHG.  Do not shave (including legs and underarms) for at least 48 hours prior to first CHG shower. It is OK to shave your face.  Please follow these instructions carefully.     Shower the NIGHT BEFORE SURGERY and the MORNING OF SURGERY with CHG Soap.   If you chose to wash your hair, wash your hair first as usual with your normal shampoo. After you shampoo, rinse your hair and body thoroughly to remove the shampoo.  Then [employer] and genitals (private parts) with your normal soap and rinse thoroughly to remove soap.  After that Use CHG Soap as you would any other liquid soap. You can  apply CHG directly to the skin and wash gently with a scrungie or a clean washcloth.   Apply the CHG Soap to your body ONLY FROM THE NECK DOWN.  Do not use on open wounds or open sores. Avoid contact with your eyes, ears, mouth and genitals (private parts). Wash Face and genitals (private parts)  with your normal soap.   Wash thoroughly, paying special attention to the area where your surgery will be performed.  Thoroughly rinse your body with warm water from the neck down.  DO NOT shower/wash with your normal soap after using and rinsing off the CHG Soap.  Pat yourself dry with a CLEAN TOWEL.  Wear CLEAN PAJAMAS to bed the night before surgery  Place CLEAN SHEETS on your bed the night before your surgery  DO NOT SLEEP WITH PETS.   Day of Surgery:  Take a shower with CHG soap. Wear  Clean/Comfortable clothing the morning of surgery    Do not wear jewelry or makeup. Do not wear lotions, powders, perfumes/cologne or deodorant. Do not shave 48 hours prior to surgery.  Men may shave face and neck. Do not bring valuables to the hospital. Do not wear nail polish, gel polish, artificial nails, or any other type of covering on natural nails (fingers and toes) If you have artificial nails or gel coating that need to be removed by a nail salon, please have this removed prior to surgery. Artificial nails or gel coating may interfere with anesthesia's ability to adequately monitor your vital signs. Remember to brush your teeth WITH YOUR REGULAR TOOTHPASTE.    If you received a COVID test during your pre-op visit, it is requested that you wear a mask when out in public, stay away from anyone that may not be feeling well, and notify your surgeon if you develop symptoms. If you have been in contact with anyone that has tested positive in the last 10 days, please notify your surgeon.    Please read over the following fact sheets that you were given.

## 2022-06-17 ENCOUNTER — Encounter (HOSPITAL_COMMUNITY): Payer: Self-pay

## 2022-06-17 ENCOUNTER — Other Ambulatory Visit: Payer: Self-pay | Admitting: Radiation Therapy

## 2022-06-17 ENCOUNTER — Other Ambulatory Visit: Payer: Self-pay

## 2022-06-17 ENCOUNTER — Encounter (HOSPITAL_COMMUNITY)
Admission: RE | Admit: 2022-06-17 | Discharge: 2022-06-17 | Disposition: A | Discharge status: Home / Self Care | Payer: Medicare Other | Source: Ambulatory Visit | Attending: Neurological Surgery | Admitting: Neurological Surgery

## 2022-06-17 VITALS — BP 159/94 | HR 62 | Temp 97.9°F | Resp 17 | Ht 72.0 in | Wt 166.2 lb

## 2022-06-17 DIAGNOSIS — I252 Old myocardial infarction: Secondary | ICD-10-CM | POA: Diagnosis not present

## 2022-06-17 DIAGNOSIS — I350 Nonrheumatic aortic (valve) stenosis: Secondary | ICD-10-CM | POA: Diagnosis not present

## 2022-06-17 DIAGNOSIS — Z87891 Personal history of nicotine dependence: Secondary | ICD-10-CM | POA: Diagnosis not present

## 2022-06-17 DIAGNOSIS — H538 Other visual disturbances: Secondary | ICD-10-CM | POA: Diagnosis not present

## 2022-06-17 DIAGNOSIS — I2510 Atherosclerotic heart disease of native coronary artery without angina pectoris: Secondary | ICD-10-CM | POA: Insufficient documentation

## 2022-06-17 DIAGNOSIS — Z7982 Long term (current) use of aspirin: Secondary | ICD-10-CM | POA: Diagnosis not present

## 2022-06-17 DIAGNOSIS — F32A Depression, unspecified: Secondary | ICD-10-CM | POA: Diagnosis not present

## 2022-06-17 DIAGNOSIS — I129 Hypertensive chronic kidney disease with stage 1 through stage 4 chronic kidney disease, or unspecified chronic kidney disease: Secondary | ICD-10-CM | POA: Insufficient documentation

## 2022-06-17 DIAGNOSIS — E785 Hyperlipidemia, unspecified: Secondary | ICD-10-CM | POA: Insufficient documentation

## 2022-06-17 DIAGNOSIS — E1136 Type 2 diabetes mellitus with diabetic cataract: Secondary | ICD-10-CM | POA: Insufficient documentation

## 2022-06-17 DIAGNOSIS — E1122 Type 2 diabetes mellitus with diabetic chronic kidney disease: Secondary | ICD-10-CM | POA: Diagnosis not present

## 2022-06-17 DIAGNOSIS — J449 Chronic obstructive pulmonary disease, unspecified: Secondary | ICD-10-CM | POA: Diagnosis not present

## 2022-06-17 DIAGNOSIS — R599 Enlarged lymph nodes, unspecified: Secondary | ICD-10-CM | POA: Insufficient documentation

## 2022-06-17 DIAGNOSIS — Z01812 Encounter for preprocedural laboratory examination: Secondary | ICD-10-CM | POA: Diagnosis not present

## 2022-06-17 DIAGNOSIS — R9431 Abnormal electrocardiogram [ECG] [EKG]: Secondary | ICD-10-CM | POA: Diagnosis not present

## 2022-06-17 DIAGNOSIS — Z01818 Encounter for other preprocedural examination: Secondary | ICD-10-CM

## 2022-06-17 DIAGNOSIS — J432 Centrilobular emphysema: Secondary | ICD-10-CM | POA: Diagnosis not present

## 2022-06-17 DIAGNOSIS — Z7984 Long term (current) use of oral hypoglycemic drugs: Secondary | ICD-10-CM | POA: Insufficient documentation

## 2022-06-17 DIAGNOSIS — Z79899 Other long term (current) drug therapy: Secondary | ICD-10-CM | POA: Diagnosis not present

## 2022-06-17 DIAGNOSIS — N189 Chronic kidney disease, unspecified: Secondary | ICD-10-CM | POA: Diagnosis not present

## 2022-06-17 DIAGNOSIS — C719 Malignant neoplasm of brain, unspecified: Secondary | ICD-10-CM | POA: Insufficient documentation

## 2022-06-17 HISTORY — DX: Acute myocardial infarction, unspecified: I21.9

## 2022-06-17 HISTORY — DX: Atherosclerotic heart disease of native coronary artery without angina pectoris: I25.10

## 2022-06-17 LAB — CBC
HCT: 53.6 % — ABNORMAL HIGH (ref 39.0–52.0)
Hemoglobin: 18.7 g/dL — ABNORMAL HIGH (ref 13.0–17.0)
MCH: 33.8 pg (ref 26.0–34.0)
MCHC: 34.9 g/dL (ref 30.0–36.0)
MCV: 96.8 fL (ref 80.0–100.0)
Platelets: 178 10*3/uL (ref 150–400)
RBC: 5.54 MIL/uL (ref 4.22–5.81)
RDW: 13.8 % (ref 11.5–15.5)
WBC: 14.7 10*3/uL — ABNORMAL HIGH (ref 4.0–10.5)
nRBC: 0 % (ref 0.0–0.2)

## 2022-06-17 LAB — BASIC METABOLIC PANEL
Anion gap: 9 (ref 5–15)
BUN: 28 mg/dL — ABNORMAL HIGH (ref 8–23)
CO2: 26 mmol/L (ref 22–32)
Calcium: 9 mg/dL (ref 8.9–10.3)
Chloride: 101 mmol/L (ref 98–111)
Creatinine, Ser: 1.05 mg/dL (ref 0.61–1.24)
GFR, Estimated: >60 mL/min (ref >60)
Glucose, Bld: 153 mg/dL — ABNORMAL HIGH (ref 70–99)
Potassium: 4.3 mmol/L (ref 3.5–5.1)
Sodium: 136 mmol/L (ref 135–145)

## 2022-06-17 LAB — GLUCOSE, CAPILLARY: Glucose-Capillary: 153 mg/dL — ABNORMAL HIGH (ref 70–99)

## 2022-06-17 LAB — HEMOGLOBIN A1C
Hgb A1c MFr Bld: 6.2 % — ABNORMAL HIGH (ref 4.8–5.6)
Mean Plasma Glucose: 131.24 mg/dL

## 2022-06-17 LAB — PROTIME-INR
INR: 0.9 (ref 0.8–1.2)
Prothrombin Time: 12.5 seconds (ref 11.4–15.2)

## 2022-06-17 NOTE — Anesthesia Preprocedure Evaluation (Addendum)
Anesthesia Evaluation  Patient identified by MRN, date of birth, ID band Patient awake    Reviewed: Allergy & Precautions, H&P , NPO status , Patient's Chart, lab work & pertinent test results, reviewed documented beta blocker date and time   Airway Mallampati: II  TM Distance: >3 FB Neck ROM: Full    Dental no notable dental hx. (+) Teeth Intact, Dental Advisory Given   Pulmonary COPD, Current Smoker and Patient abstained from smoking.   Pulmonary exam normal breath sounds clear to auscultation       Cardiovascular hypertension, Pt. on medications and Pt. on home beta blockers + CAD and + Past MI   Rhythm:Regular Rate:Normal     Neuro/Psych   Anxiety Depression    negative neurological ROS     GI/Hepatic negative GI ROS, Neg liver ROS,,,  Endo/Other  diabetes, Well Controlled    Renal/GU negative Renal ROS  negative genitourinary   Musculoskeletal  (+) Arthritis , Osteoarthritis,    Abdominal   Peds  Hematology negative hematology ROS (+)   Anesthesia Other Findings   Reproductive/Obstetrics negative OB ROS                             Anesthesia Physical Anesthesia Plan  ASA: 3  Anesthesia Plan: General   Post-op Pain Management: Tylenol PO (pre-op)*   Induction: Intravenous  PONV Risk Score and Plan: 2 and Ondansetron and Dexamethasone  Airway Management Planned: Oral ETT  Additional Equipment: Arterial line  Intra-op Plan:   Post-operative Plan: Extubation in OR  Informed Consent: I have reviewed the patients History and Physical, chart, labs and discussed the procedure including the risks, benefits and alternatives for the proposed anesthesia with the patient or authorized representative who has indicated his/her understanding and acceptance.     Dental advisory given  Plan Discussed with: CRNA  Anesthesia Plan Comments: (PAT note written 06/17/2022 by Myra Gianotti, PA-C.  )       Anesthesia Quick Evaluation

## 2022-06-17 NOTE — Progress Notes (Signed)
Anesthesia Chart Review:  Case: [id number] Date/Time: 06/21/22 1133   Procedures:      Left stereotactic brain biopsy (Left)     APPLICATION OF CRANIAL NAVIGATION (Left)   Anesthesia type: General   Pre-op diagnosis: Brain mass   Location: [facility] OR ROOM 20 / [place] [facility]   Surgeons: Eustace Moore, MD       DISCUSSION: Patient is a 66 year old male scheduled for the above procedure.  History includes smoking, COPD, HTN, HLD, DM2, COPD, CAD (NSTEMI 11/27/20, medical therapy), brain tumor (05/2022), depression, spinal surgery (L3-5 PLIF 05/14/15).  He recently saw an ophthalmologist for visual changes and MRI on 05/26/22 showed a large brain mass. He was referred to neurosurgeon Dr. Ronnald Ramp and to Dr. Mickeal Skinner with neuro-oncology. He had initial consultation with Dr. Mickeal Skinner on 06/01/21 for left temporal mass. He had noted blurry vision for about a month and noted he had been "dropping cigarettes". He wrote, "He presents with clinical and radiographic syndrome consistent with likely primary brain neoplasm.  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   We discussed and recommended stereotactic, diagnostic biopsy with Dr. Ronnald Ramp.  He is agreeable with this plan." He is on Decadron 4 mg TID and tramadol as needed, but otherwise is not taking routine medications-he has refused cardiac medications (see below).  He has not scheduled follow-up with cardiology since he was last evaluated by Almyra Deforest, [place] on 12/19/20. He had been admitted to [facility] on 11/27/20 for chest pain and ruled in for NSTEMI (serial tropronin up to 12/902). 11/27/20 cath showed 90% D1, 90% D2, 40% mid LAD, 20% ostial LCX, EF 55-65%.  The D1 artery was very small and D2 artery was only slightly larger, so medical therapy was recommended.  He was placed on aspirin, Plavix, beta-blocker, Imdur, and high-dose statin therapy.  Echocardiogram obtained on 11/29/20 showed EF  55-60%, grade 1 DD, RVSP 40.9 mmHg. At follow-up he had no further chest pain. He was still smoking. Some inconsistency with medications, so compliance encouraged. 3 month follow-up planned. He did follow-up with Rollen Sox, [facility] in the [facility] on 03/26/21, but had stopped taking his medication, stating, "we are all going to die. If it's God's time its god's time." He did have an ED evaluation on 08/05/21 for chest tightness in setting of + influenza A with bronchitis/possible pneumonitis on CTA. Serial troponins were negative. He has continued to see his internist Dr. Quay Burow. On 03/17/22, she also noted that she had tried to restart him on cardiac medication but he continues to stop citing he is going to die at some point and "understands that not taking the medications may increase his risk of having a heart attack or stroke and make his death sooner than if he was taking the medication." He has been referred to therapy for depression.   NSTEMI in April 2022 with DIAG too small for PCI and medical therapy recommended. He has declined medications and has been primarily seeing primary care until recently when he was diagnosed with a symptomatic brain tumor, favored to be a primary brain neoplasm. Brain biopsy needed for tissue diagnosis and to guide management. He denied chest pain and SOB at PAT RN visit. Discussed with anesthesiologist Hoy Morn, MD. Anesthesia team to evaluate on the day of surgery.   VS: BP (!) 159/94   Pulse 62   Temp 36.6 C   Resp 17   Ht 6' (  1.829 m)   Wt 75.4 kg   SpO2 100%   BMI 22.54 kg/m   PROVIDERS: Binnie Rail, MD is PCP  Cherlynn Kaiser, MD is cardiologist  Cecil Cobbs, MD is [place]   LABS: Preoperative labs noted. WBC 14.7, H/H elevated, Decadron likely contributing. He is also a smoker.  (all labs ordered are listed, but only abnormal results are displayed)  Labs Reviewed  HEMOGLOBIN A1C - Abnormal; Notable for the following  components:      Result Value   Hgb A1c MFr Bld 6.2 (*)    All other components within normal limits  BASIC METABOLIC PANEL - Abnormal; Notable for the following components:   Glucose, Bld 153 (*)    BUN 28 (*)    All other components within normal limits  CBC - Abnormal; Notable for the following components:   WBC 14.7 (*)    Hemoglobin 18.7 (*)    HCT 53.6 (*)    All other components within normal limits  GLUCOSE, CAPILLARY - Abnormal; Notable for the following components:   Glucose-Capillary 153 (*)    All other components within normal limits  PROTIME-INR     IMAGES: MRI Brain 06/03/22: IMPRESSION: Infiltrative mass centered in the left basal ganglia extending to the brainstem with a small enhancement favored to reflect primary glioma.  MRI Brain 05/26/22 ([facility]): IMPRESSION:  1. Large area of likely primary brain neoplasm such as from a glioma infiltrates the left cerebral hemisphere and extends down the left cerebral peduncle to the pontomedullary junction. This also extends to and involves the left optic nerve.  2.  Chronic findings as above.   MRI Orbits 05/26/22 ([facility]): IMPRESSION:  The masslike infiltrating T2/FLAIR signal within the brain extends to the left aspect of the optic chiasm with likely a small amount of extension to the right aspect. A small amount extends into the prechiasmatic segment of the left optic nerve. This is likely related to tumor.   CTA Chest 08/06/21: IMPRESSION: 1. No appreciable arterial embolus or dilatation. No secondary signs of right heart strain. 2. Aortic and coronary artery atherosclerosis with ectasia of the aortic root and ascending segment measuring 3.6 cm and 3.8 cm respectively. 3. Mildly prominent mediastinal and hilar lymph nodes. No bulky or encasing adenopathy. 4. Hyperinflated with early centrilobular emphysema in the apices, thickened bronchi centrally consistent with bronchitis or reactive airways  disease, and bronchiolar impaction in some of the thickened lower lobe small airways as well as ground-glass opacities in the right middle lobe and lingula which could be microatelectasis or pneumonitis. Three-month follow-up study recommended. 5. 1.5 cm lingular base opacity which could be due to atelectasis, a small pneumonia, or a nodule. PET-CT or three-month follow-up CT recommended.   EKG: 08/06/21:  Sinus rhythm Left axis deviation Nonspecific T abnormalities, lateral leads Confirmed by Nanda Quinton [phone]) on 08/06/2021 1:44:50 AM   CV: Echo 11/28/20: IMPRESSIONS   1. Left ventricular ejection fraction, by estimation, is 55 to 60%. The  left ventricle has normal function. The left ventricle has no regional  wall motion abnormalities. There is mild left ventricular hypertrophy.  Left ventricular diastolic parameters  are consistent with Grade I diastolic dysfunction (impaired relaxation).   2. Right ventricular systolic function is normal. The right ventricular  size is normal. There is mildly elevated pulmonary artery systolic  pressure. The estimated right ventricular systolic pressure is 64.3 mmHg.   3. The mitral valve is normal in structure. Trivial mitral valve  regurgitation. No evidence of mitral stenosis.   4. The aortic valve is tricuspid. Aortic valve regurgitation is not  visualized. Mild aortic valve sclerosis is present, with no evidence of  aortic valve stenosis.   5. The inferior vena cava is normal in size with greater than 50%  respiratory variability, suggesting right atrial pressure of 3 mmHg.    Cardiac cath 11/27/20: 1st Diag lesion is 90% stenosed. 2nd Diag lesion is 90% stenosed. Mid LAD lesion is 40% stenosed. Ost Cx to Prox Cx lesion is 20% stenosed. The left ventricular systolic function is normal. LV end diastolic pressure is normal. The left ventricular ejection fraction is 55-65% by visual estimate.   IMPRESSION: Mr. Dilger has  nonobstructive CAD.  He does have ostial diagonal branch disease and is very small D1 and a slightly larger D2.  He has moderate although nonobstructive appearing mid LAD disease.  His LV function is normal without focal wall motion abnormalities.  Medical therapy will be recommended with aspirin and Plavix as well as beta-blocker and high-dose statin therapy.  The sheath was removed and a TR band was placed on the right wrist to achieve patent hemostasis.  The patient left lab in stable condition.    Past Medical History:  Diagnosis Date   Anxiety    Cataract    beginnings    Chronic kidney disease 04/23/2015   ARF- "resolved"   Colon polyp    COPD (chronic obstructive pulmonary disease) ([place])    per pt from smoking    Coronary artery disease    Dehydration 04/2015   Hospitalized for 4 days after abnormal labs found during preop appointment.   Depression    Diabetes mellitus without complication ([place])    D3T 04/24/15- diet controlled    Diverticulosis    Hyperlipidemia    Hypertension    Myocardial infarction ([place])    Tobacco abuse     Past Surgical History:  Procedure Laterality Date   BACK SURGERY     CARDIAC CATHETERIZATION     COLONOSCOPY     colonoscopy with polypectomy     polyp X 1   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   EYE SURGERY     LEFT HEART CATH AND CORONARY ANGIOGRAPHY N/A 11/27/2020   Procedure: LEFT HEART CATH AND CORONARY ANGIOGRAPHY;  Surgeon: Lorretta Harp, MD;  Location: [facility];  Service: Cardiovascular;  Laterality: N/A;   MAXIMUM ACCESS (MAS)POSTERIOR LUMBAR INTERBODY FUSION (PLIF) 2 LEVEL N/A 05/14/2015   Procedure: Lumbar three-lumbar four,  umbar four-lumbar five Maximum access posterior lumbar interbody fusion;  Surgeon: Eustace Moore, MD;  Location: [facility];  Service: Neurosurgery;  Laterality: N/A;  L3-4 L4-5 Maximum access posterior lumbar interbody fusion   POLYPECTOMY     WISDOM TOOTH EXTRACTION       MEDICATIONS:  aspirin EC 81 MG tablet   atorvastatin (LIPITOR) 80 MG tablet   clopidogrel (PLAVIX) 75 MG tablet   dexamethasone (DECADRON) 4 MG tablet   metoprolol succinate (TOPROL-XL) 25 MG 24 hr tablet   sertraline (ZOLOFT) 50 MG tablet   traMADol (ULTRAM) 50 MG tablet   No current facility-administered medications for this encounter.    Myra Gianotti, PA-C Surgical Short Stay/Anesthesiology [facility] Phone [phone] [facility] Phone [phone] 06/17/2022 3:04 PM

## 2022-06-17 NOTE — Progress Notes (Addendum)
PCP - Billey Gosling, MD Cardiologist - Cherlynn Kaiser, MD - patient states that he didn't see his cardiologist lately.  PPM/ICD - denies Device Orders - n/a Rep Notified - n/a  Chest x-ray - 08/05/21 EKG - 08/06/21 Stress Test - denies ECHO - 11/28/20 Cardiac Cath - 11/27/20  Sleep Study - denies CPAP - n/a  Checks Blood Sugar - patient is not checking CBG at home CBG today - 153 A1C - done in PAT on 06/17/22  Last dose of GLP1 agonist- n/a  Blood Thinner Instructions: n/a  Aspirin Instructions: Patient was instructed: As of today, STOP taking any Aspirin (unless otherwise instructed by your surgeon) Aleve, Naproxen, Ibuprofen, Motrin, Advil, Goody's, BC's, all herbal medications, fish oil, and all vitamins.    ERAS Protcol - n/a   COVID TEST- n/a   Anesthesia review: yes - cardiac history; abnormal labs in PAT  Patient denies shortness of breath, fever, cough and chest pain at PAT appointment   All instructions explained to the patient, with a verbal understanding of the material. Patient agrees to go over the instructions while at home for a better understanding. Patient also instructed to self quarantine after being tested for COVID-19. The opportunity to ask questions was provided.

## 2022-06-17 NOTE — Progress Notes (Signed)
Abnormal labs in PAT. Dr. Adah Salvage office was notified.

## 2022-06-21 ENCOUNTER — Inpatient Hospital Stay (HOSPITAL_COMMUNITY): Payer: Medicare Other | Admitting: Certified Registered Nurse Anesthetist

## 2022-06-21 ENCOUNTER — Inpatient Hospital Stay (HOSPITAL_COMMUNITY): Payer: Medicare Other | Admitting: Vascular Surgery

## 2022-06-21 ENCOUNTER — Other Ambulatory Visit: Payer: Self-pay

## 2022-06-21 ENCOUNTER — Encounter (HOSPITAL_COMMUNITY)
Admission: RE | Disposition: A | Discharge status: Home / Self Care | Payer: Self-pay | Source: Home / Self Care | Attending: Neurological Surgery

## 2022-06-21 ENCOUNTER — Encounter (HOSPITAL_COMMUNITY): Payer: Self-pay | Admitting: Neurological Surgery

## 2022-06-21 ENCOUNTER — Inpatient Hospital Stay (HOSPITAL_COMMUNITY)
Admission: RE | Admit: 2022-06-21 | Discharge: 2022-06-22 | DRG: 027 | Disposition: A | Discharge status: Home / Self Care | Payer: Medicare Other | Attending: Neurological Surgery | Admitting: Neurological Surgery

## 2022-06-21 DIAGNOSIS — Z888 Allergy status to other drugs, medicaments and biological substances status: Secondary | ICD-10-CM

## 2022-06-21 DIAGNOSIS — Z981 Arthrodesis status: Secondary | ICD-10-CM | POA: Diagnosis not present

## 2022-06-21 DIAGNOSIS — G9389 Other specified disorders of brain: Secondary | ICD-10-CM

## 2022-06-21 DIAGNOSIS — Z881 Allergy status to other antibiotic agents status: Secondary | ICD-10-CM

## 2022-06-21 DIAGNOSIS — I10 Essential (primary) hypertension: Secondary | ICD-10-CM | POA: Diagnosis present

## 2022-06-21 DIAGNOSIS — F17210 Nicotine dependence, cigarettes, uncomplicated: Secondary | ICD-10-CM | POA: Diagnosis present

## 2022-06-21 DIAGNOSIS — I252 Old myocardial infarction: Secondary | ICD-10-CM

## 2022-06-21 DIAGNOSIS — I2510 Atherosclerotic heart disease of native coronary artery without angina pectoris: Secondary | ICD-10-CM

## 2022-06-21 DIAGNOSIS — F32A Depression, unspecified: Secondary | ICD-10-CM | POA: Diagnosis not present

## 2022-06-21 DIAGNOSIS — Z7902 Long term (current) use of antithrombotics/antiplatelets: Secondary | ICD-10-CM

## 2022-06-21 DIAGNOSIS — E119 Type 2 diabetes mellitus without complications: Secondary | ICD-10-CM | POA: Diagnosis not present

## 2022-06-21 DIAGNOSIS — F419 Anxiety disorder, unspecified: Secondary | ICD-10-CM | POA: Diagnosis present

## 2022-06-21 DIAGNOSIS — F17200 Nicotine dependence, unspecified, uncomplicated: Secondary | ICD-10-CM

## 2022-06-21 DIAGNOSIS — Z79891 Long term (current) use of opiate analgesic: Secondary | ICD-10-CM | POA: Diagnosis not present

## 2022-06-21 DIAGNOSIS — Z833 Family history of diabetes mellitus: Secondary | ICD-10-CM

## 2022-06-21 DIAGNOSIS — H5462 Unqualified visual loss, left eye, normal vision right eye: Secondary | ICD-10-CM | POA: Diagnosis not present

## 2022-06-21 DIAGNOSIS — Z7982 Long term (current) use of aspirin: Secondary | ICD-10-CM

## 2022-06-21 DIAGNOSIS — Z9889 Other specified postprocedural states: Secondary | ICD-10-CM

## 2022-06-21 DIAGNOSIS — Z79899 Other long term (current) drug therapy: Secondary | ICD-10-CM

## 2022-06-21 DIAGNOSIS — Z880 Allergy status to penicillin: Secondary | ICD-10-CM | POA: Diagnosis not present

## 2022-06-21 DIAGNOSIS — C713 Malignant neoplasm of parietal lobe: Principal | ICD-10-CM | POA: Diagnosis present

## 2022-06-21 DIAGNOSIS — Z01818 Encounter for other preprocedural examination: Secondary | ICD-10-CM

## 2022-06-21 DIAGNOSIS — J449 Chronic obstructive pulmonary disease, unspecified: Secondary | ICD-10-CM | POA: Diagnosis not present

## 2022-06-21 DIAGNOSIS — G939 Disorder of brain, unspecified: Secondary | ICD-10-CM | POA: Diagnosis not present

## 2022-06-21 DIAGNOSIS — E785 Hyperlipidemia, unspecified: Secondary | ICD-10-CM | POA: Diagnosis present

## 2022-06-21 DIAGNOSIS — D496 Neoplasm of unspecified behavior of brain: Secondary | ICD-10-CM | POA: Diagnosis not present

## 2022-06-21 HISTORY — PX: BRAIN BIOPSY: SHX6409

## 2022-06-21 HISTORY — PX: APPLICATION OF CRANIAL NAVIGATION: SHX6578

## 2022-06-21 LAB — GLUCOSE, CAPILLARY
Glucose-Capillary: 96 mg/dL (ref 70–99)
Glucose-Capillary: 114 mg/dL — ABNORMAL HIGH (ref 70–99)

## 2022-06-21 SURGERY — BRAIN BIOPSY
Anesthesia: General | Site: Head | Laterality: Left

## 2022-06-21 MED ORDER — FENTANYL CITRATE (PF) 250 MCG/5ML IJ SOLN
INTRAMUSCULAR | Status: AC
  Filled 2022-06-21: qty 5

## 2022-06-21 MED ORDER — PROPOFOL 10 MG/ML IV BOLUS
INTRAVENOUS | Status: DC | PRN
  Administered 2022-06-21 (×2): 100 mg via INTRAVENOUS
  Administered 2022-06-21 (×2): 50 mg via INTRAVENOUS

## 2022-06-21 MED ORDER — CHLORHEXIDINE GLUCONATE CLOTH 2 % EX PADS: 6.0000 | MEDICATED_PAD | Freq: Once | CUTANEOUS | Status: DC

## 2022-06-21 MED ORDER — SENNA 8.6 MG PO TABS
1.0000 | ORAL_TABLET | Freq: Two times a day (BID) | ORAL | Status: DC
  Administered 2022-06-21: 8.6 mg via ORAL
  Filled 2022-06-21: qty 1

## 2022-06-21 MED ORDER — THROMBIN 5000 UNITS EX SOLR: OROMUCOSAL | Status: DC | PRN

## 2022-06-21 MED ORDER — DEXAMETHASONE SODIUM PHOSPHATE 10 MG/ML IJ SOLN
INTRAMUSCULAR | Status: DC | PRN
  Administered 2022-06-21: 10 mg via INTRAVENOUS

## 2022-06-21 MED ORDER — SUGAMMADEX SODIUM 200 MG/2ML IV SOLN
INTRAVENOUS | Status: DC | PRN
  Administered 2022-06-21: 200 mg via INTRAVENOUS

## 2022-06-21 MED ORDER — ONDANSETRON HCL 4 MG PO TABS: 4.0000 mg | ORAL_TABLET | ORAL | Status: DC | PRN

## 2022-06-21 MED ORDER — MORPHINE SULFATE (PF) 2 MG/ML IV SOLN: 1.0000 mg | INTRAVENOUS | Status: DC | PRN

## 2022-06-21 MED ORDER — DEXAMETHASONE SODIUM PHOSPHATE 4 MG/ML IJ SOLN: 4.0000 mg | Freq: Three times a day (TID) | INTRAMUSCULAR | Status: DC

## 2022-06-21 MED ORDER — FENTANYL CITRATE (PF) 250 MCG/5ML IJ SOLN
INTRAMUSCULAR | Status: DC | PRN
  Administered 2022-06-21 (×3): 50 ug via INTRAVENOUS

## 2022-06-21 MED ORDER — POTASSIUM CHLORIDE IN NACL 20-0.9 MEQ/L-% IV SOLN
INTRAVENOUS | Status: DC
  Filled 2022-06-21 (×2): qty 1000

## 2022-06-21 MED ORDER — PANTOPRAZOLE SODIUM 40 MG IV SOLR
40.0000 mg | Freq: Every day | INTRAVENOUS | Status: DC
  Administered 2022-06-21: 40 mg via INTRAVENOUS
  Filled 2022-06-21: qty 10

## 2022-06-21 MED ORDER — LEVETIRACETAM IN NACL 500 MG/100ML IV SOLN
500.0000 mg | Freq: Two times a day (BID) | INTRAVENOUS | Status: DC
  Administered 2022-06-21 – 2022-06-22 (×2): 500 mg via INTRAVENOUS
  Filled 2022-06-21 (×2): qty 100

## 2022-06-21 MED ORDER — GLYCOPYRROLATE PF 0.2 MG/ML IJ SOSY
PREFILLED_SYRINGE | INTRAMUSCULAR | Status: DC | PRN
  Administered 2022-06-21: .1 mg via INTRAVENOUS

## 2022-06-21 MED ORDER — HYDRALAZINE HCL 20 MG/ML IJ SOLN
INTRAMUSCULAR | Status: AC
  Administered 2022-06-21: 5 mg via INTRAVENOUS
  Filled 2022-06-21: qty 1

## 2022-06-21 MED ORDER — ACETAMINOPHEN 500 MG PO TABS
1000.0000 mg | ORAL_TABLET | Freq: Once | ORAL | Status: AC
  Administered 2022-06-21: 1000 mg via ORAL
  Filled 2022-06-21: qty 2

## 2022-06-21 MED ORDER — LABETALOL HCL 5 MG/ML IV SOLN: 10.0000 mg | INTRAVENOUS | Status: DC | PRN

## 2022-06-21 MED ORDER — SODIUM CHLORIDE 0.9 % IV SOLN: INTRAVENOUS | Status: DC

## 2022-06-21 MED ORDER — LACTATED RINGERS IV SOLN: INTRAVENOUS | Status: DC

## 2022-06-21 MED ORDER — VANCOMYCIN HCL IN DEXTROSE 1-5 GM/200ML-% IV SOLN
1000.0000 mg | INTRAVENOUS | Status: AC
  Administered 2022-06-21: 1000 mg via INTRAVENOUS
  Filled 2022-06-21: qty 200

## 2022-06-21 MED ORDER — PHENYLEPHRINE HCL-NACL 20-0.9 MG/250ML-% IV SOLN
INTRAVENOUS | Status: DC | PRN
  Administered 2022-06-21: 50 ug/min via INTRAVENOUS

## 2022-06-21 MED ORDER — DEXAMETHASONE SODIUM PHOSPHATE 10 MG/ML IJ SOLN
6.0000 mg | Freq: Four times a day (QID) | INTRAMUSCULAR | Status: DC
  Administered 2022-06-22 (×2): 6 mg via INTRAVENOUS
  Filled 2022-06-21 (×2): qty 1

## 2022-06-21 MED ORDER — THROMBIN 5000 UNITS EX SOLR
CUTANEOUS | Status: AC
  Filled 2022-06-21: qty 5000

## 2022-06-21 MED ORDER — HYDRALAZINE HCL 20 MG/ML IJ SOLN
5.0000 mg | Freq: Four times a day (QID) | INTRAMUSCULAR | Status: DC | PRN
  Filled 2022-06-21: qty 1

## 2022-06-21 MED ORDER — ACETAMINOPHEN 650 MG RE SUPP: 650.0000 mg | RECTAL | Status: DC | PRN

## 2022-06-21 MED ORDER — HYDROCODONE-ACETAMINOPHEN 5-325 MG PO TABS
1.0000 | ORAL_TABLET | ORAL | Status: DC | PRN
  Administered 2022-06-21: 1 via ORAL
  Filled 2022-06-21: qty 1

## 2022-06-21 MED ORDER — BACITRACIN ZINC 500 UNIT/GM EX OINT
TOPICAL_OINTMENT | CUTANEOUS | Status: AC
  Filled 2022-06-21: qty 28.35

## 2022-06-21 MED ORDER — LIDOCAINE 2% (20 MG/ML) 5 ML SYRINGE
INTRAMUSCULAR | Status: DC | PRN
  Administered 2022-06-21: 60 mg via INTRAVENOUS

## 2022-06-21 MED ORDER — 0.9 % SODIUM CHLORIDE (POUR BTL) OPTIME
TOPICAL | Status: DC | PRN
  Administered 2022-06-21: 1000 mL

## 2022-06-21 MED ORDER — ONDANSETRON HCL 4 MG/2ML IJ SOLN
INTRAMUSCULAR | Status: DC | PRN
  Administered 2022-06-21: 4 mg via INTRAVENOUS

## 2022-06-21 MED ORDER — PHENYLEPHRINE 80 MCG/ML (10ML) SYRINGE FOR IV PUSH (FOR BLOOD PRESSURE SUPPORT)
PREFILLED_SYRINGE | INTRAVENOUS | Status: DC | PRN
  Administered 2022-06-21: 40 ug via INTRAVENOUS

## 2022-06-21 MED ORDER — ORAL CARE MOUTH RINSE: 15.0000 mL | Freq: Once | OROMUCOSAL | Status: AC

## 2022-06-21 MED ORDER — PROMETHAZINE HCL 25 MG PO TABS: 12.5000 mg | ORAL_TABLET | ORAL | Status: DC | PRN

## 2022-06-21 MED ORDER — DEXAMETHASONE SODIUM PHOSPHATE 4 MG/ML IJ SOLN: 4.0000 mg | Freq: Four times a day (QID) | INTRAMUSCULAR | Status: DC

## 2022-06-21 MED ORDER — ONDANSETRON HCL 4 MG/2ML IJ SOLN: 4.0000 mg | INTRAMUSCULAR | Status: DC | PRN

## 2022-06-21 MED ORDER — ACETAMINOPHEN 325 MG PO TABS: 650.0000 mg | ORAL_TABLET | ORAL | Status: DC | PRN

## 2022-06-21 MED ORDER — BUPIVACAINE HCL (PF) 0.25 % IJ SOLN
INTRAMUSCULAR | Status: AC
  Filled 2022-06-21: qty 30

## 2022-06-21 MED ORDER — BACITRACIN ZINC 500 UNIT/GM EX OINT
TOPICAL_OINTMENT | CUTANEOUS | Status: DC | PRN
  Administered 2022-06-21: 1 via TOPICAL

## 2022-06-21 MED ORDER — CHLORHEXIDINE GLUCONATE 0.12 % MT SOLN
15.0000 mL | Freq: Once | OROMUCOSAL | Status: AC
  Administered 2022-06-21: 15 mL via OROMUCOSAL
  Filled 2022-06-21: qty 15

## 2022-06-21 MED ORDER — LIDOCAINE-EPINEPHRINE 1 %-1:100000 IJ SOLN
INTRAMUSCULAR | Status: DC | PRN
  Administered 2022-06-21: 6 mL

## 2022-06-21 MED ORDER — ROCURONIUM BROMIDE 10 MG/ML (PF) SYRINGE
PREFILLED_SYRINGE | INTRAVENOUS | Status: DC | PRN
  Administered 2022-06-21: 100 mg via INTRAVENOUS

## 2022-06-21 SURGICAL SUPPLY — 36 items
BAG COUNTER SPONGE SURGICOUNT (BAG) ×2 IMPLANT
CANISTER SUCT 3000ML PPV (MISCELLANEOUS) ×2 IMPLANT
COVER BURR HOLE 14 (Orthopedic Implant) IMPLANT
COVERAGE SUPPORT O-ARM STEALTH (MISCELLANEOUS) ×2 IMPLANT
DRAPE NEUROLOGICAL W/INCISE (DRAPES) IMPLANT
DRSG OPSITE POSTOP 3X4 (GAUZE/BANDAGES/DRESSINGS) IMPLANT
DURAPREP 26ML APPLICATOR (WOUND CARE) IMPLANT
ELECT REM PT RETURN 9FT ADLT (ELECTROSURGICAL) ×2
ELECTRODE REM PT RTRN 9FT ADLT (ELECTROSURGICAL) ×2 IMPLANT
FEE COVERAGE SUPPORT O-ARM (MISCELLANEOUS) ×2 IMPLANT
GLOVE BIO SURGEON STRL SZ7 (GLOVE) IMPLANT
GLOVE BIO SURGEON STRL SZ8 (GLOVE) ×2 IMPLANT
GLOVE BIOGEL PI IND STRL 7.0 (GLOVE) IMPLANT
GOWN STRL REUS W/ TWL LRG LVL3 (GOWN DISPOSABLE) IMPLANT
GOWN STRL REUS W/ TWL XL LVL3 (GOWN DISPOSABLE) IMPLANT
GOWN STRL REUS W/TWL 2XL LVL3 (GOWN DISPOSABLE) IMPLANT
GOWN STRL REUS W/TWL LRG LVL3 (GOWN DISPOSABLE) ×4
GOWN STRL REUS W/TWL XL LVL3 (GOWN DISPOSABLE) ×4
KIT BASIN OR (CUSTOM PROCEDURE TRAY) ×2 IMPLANT
KIT GUIDE INT PASS TRAJECTORY (MISCELLANEOUS) IMPLANT
KIT NDL BIOPSY PASSIVE (NEEDLE) IMPLANT
KIT NEEDLE BIOPSY PASSIVE (NEEDLE) ×2 IMPLANT
KIT TURNOVER KIT B (KITS) ×2 IMPLANT
MARKER SPHERE PSV REFLC NDI (MISCELLANEOUS) ×6 IMPLANT
NDL HYPO 25X1 1.5 SAFETY (NEEDLE) ×2 IMPLANT
NEEDLE HYPO 25X1 1.5 SAFETY (NEEDLE) ×2 IMPLANT
NS IRRIG 1000ML POUR BTL (IV SOLUTION) ×2 IMPLANT
PACK CRANIOTOMY CUSTOM (CUSTOM PROCEDURE TRAY) IMPLANT
PAD ARMBOARD 7.5X6 YLW CONV (MISCELLANEOUS) ×6 IMPLANT
PERFORATOR CRAN ADLT SML 11X7 (MISCELLANEOUS) IMPLANT
SCREW UNIII AXS SD 1.5X4 (Screw) IMPLANT
SUT NURALON 4 0 TF (SUTURE) IMPLANT
SUT VIC AB 2-0 CP2 18 (SUTURE) ×2 IMPLANT
TOWEL GREEN STERILE (TOWEL DISPOSABLE) ×2 IMPLANT
TOWEL GREEN STERILE FF (TOWEL DISPOSABLE) ×2 IMPLANT
WATER STERILE IRR 1000ML POUR (IV SOLUTION) ×2 IMPLANT

## 2022-06-21 NOTE — Anesthesia Procedure Notes (Signed)
Procedure Name: Intubation Date/Time: 06/21/2022 12:04 PM  Performed by: Valda Favia, CRNAPre-anesthesia Checklist: Patient identified, Emergency Drugs available, Suction available and Patient being monitored Patient Re-evaluated:Patient Re-evaluated prior to induction Oxygen Delivery Method: Circle System Utilized Preoxygenation: Pre-oxygenation with 100% oxygen Induction Type: IV induction Ventilation: Mask ventilation without difficulty and Oral airway inserted - appropriate to patient size Laryngoscope Size: Mac and 4 Grade View: Grade I Tube type: Oral Tube size: 7.5 mm Number of attempts: 1 Airway Equipment and Method: Stylet and Oral airway Placement Confirmation: ETT inserted through vocal cords under direct vision, positive ETCO2 and breath sounds checked- equal and bilateral Secured at: 21 cm Tube secured with: Tape Dental Injury: Teeth and Oropharynx as per pre-operative assessment

## 2022-06-21 NOTE — Anesthesia Procedure Notes (Signed)
Arterial Line Insertion Performed by: Valda Favia, CRNA, CRNA  Patient location: OR. Preanesthetic checklist: patient identified, IV checked, site marked, risks and benefits discussed, surgical consent, monitors and equipment checked, pre-op evaluation, timeout performed and anesthesia consent Patient sedated Left, radial was placed Catheter size: 20 G Hand hygiene performed , maximum sterile barriers used  and Seldinger technique used Allen's test indicative of satisfactory collateral circulation Attempts: 1 Procedure performed without using ultrasound guided technique. Following insertion, dressing applied and Biopatch. Post procedure assessment: normal  Patient tolerated the procedure well with no immediate complications.

## 2022-06-21 NOTE — Anesthesia Postprocedure Evaluation (Signed)
Anesthesia Post Note  Patient: Keith [name]  Procedure(s) Performed: Left Stereotactic Brain Biopsy (Left: Head) APPLICATION OF CRANIAL NAVIGATION (Left)     Patient location during evaluation: PACU Anesthesia Type: General Level of consciousness: awake and alert Pain management: pain level controlled Vital Signs Assessment: post-procedure vital signs reviewed and stable Respiratory status: spontaneous breathing, nonlabored ventilation and respiratory function stable Cardiovascular status: blood pressure returned to baseline and stable Postop Assessment: no apparent nausea or vomiting Anesthetic complications: no  No notable events documented.  Last Vitals:  Vitals:   06/21/22 1425 06/21/22 1440  BP: (!) 145/79 (!) 148/80  Pulse: (!) 59 (!) 52  Resp: 13 14  Temp:  36.7 C  SpO2: 94% 94%    Last Pain:  Vitals:   06/21/22 1410  TempSrc:   PainSc: 0-No pain                 Ayinde Swim,W. EDMOND

## 2022-06-21 NOTE — Transfer of Care (Signed)
Immediate Anesthesia Transfer of Care Note  Patient: Keith [name]  Procedure(s) Performed: Left Stereotactic Brain Biopsy (Left: Head) APPLICATION OF CRANIAL NAVIGATION (Left)  Patient Location: PACU  Anesthesia Type:General  Level of Consciousness: drowsy  Airway & Oxygen Therapy: Patient Spontanous Breathing and Patient connected to nasal cannula oxygen  Post-op Assessment: Report given to RN and Post -op Vital signs reviewed and stable  Post vital signs: Reviewed and stable  Last Vitals:  Vitals Value Taken Time  BP 137/84 06/21/22 1410  Temp    Pulse 60 06/21/22 1412  Resp 14 06/21/22 1412  SpO2 98 % 06/21/22 1412  Vitals shown include unvalidated device data.  Last Pain:  Vitals:   06/21/22 1410  TempSrc:   PainSc: 0-No pain         Complications: No notable events documented.

## 2022-06-21 NOTE — Op Note (Signed)
06/21/2022  1:17 PM  PATIENT:  Keith [name]  66 y.o. male  PRE-OPERATIVE DIAGNOSIS:  left brain mass  POST-OPERATIVE DIAGNOSIS:  same  PROCEDURE: Left parietal stereotactic brain biopsy utilizing Stealth frameless stereotactic image guidance  SURGEON:  Sherley Bounds, MD  ASSISTANTS: Glenford Peers FNP  ANESTHESIA:   General  EBL: minimal ml  No intake/output data recorded.  BLOOD ADMINISTERED: none  DRAINS: none  SPECIMEN:  none  INDICATION FOR PROCEDURE: This patient presented with visual changes in the left eye. Imaging showed a large left-sided white matter mass starting in the ring stem and extending through the white matter of the basal ganglia into the occipital region on the left small area of enhancement.  Recommended t stereotactic brain biopsy for diagnosis. Patient understood the risks, benefits, and alternatives and potential outcomes and wished to proceed.  PROCEDURE DETAILS: The patient was taken to the operating room and after induction of adequate generalized endotracheal anesthesia, the head was affixed in a 3 point [place] head rest, and turned to the right to expose the left frontotemporal parietal region. The head was shaved and then cleaned.  We used our Stealth image guidance to register our points and checked our points to confirm accuracy.  We then planned our entry point and trajectory and made sure we were avoiding vital structures like venous structures and sulci.  And then prepped with DuraPrep and draped in the usual sterile fashion.  5 cc of local anesthetic was injected, and a linear incision was made on the left of the head centered over our entry point.  A burr hole was placed, and a small plastic dome was screwed into position with 3 small screws.  We then popped in our pipe and placed our trajectory tool to mark our trajectory.  We then locked our plate into position.  The trajectory tool was removed and the guide was placed into the plate.  We used  Stealth to measure the depth of target.  This was 95 mm so we set our biopsy instrument to 95 mm.  We then placed our biopsy instrument through the guide and washed it in real-time.  We opened the biopsy instrument and sucked back and closed the instrument.  We did this in 4 different planes.  Each time we removed the small amount of brain biopsy and placed it in saline.  We sent the 4 pieces to pathology for hopeful diagnosis. The pathologist called and said we definitely had lesional tissue with hypercellularity, and necrosis. The biopsy apparatus was removed and a burr hole cover was placed.  The wound was copiously irrigated. the galea was then closed with interrupted 2-0 Vicryl suture. The skin was then closed with staples a sterile dressing was applied. The patient was then taken out of the 3-point [place] headrest and awakened from general anesthesia, and transported to the recovery room in stable condition. At the end of the procedure all sponge, needle, and instrument counts were correct.    PLAN OF CARE: Admit to inpatient   PATIENT DISPOSITION:  PACU - hemodynamically stable.   Delay start of Pharmacological VTE agent (>24hrs) due to surgical blood loss or risk of bleeding:  yes

## 2022-06-21 NOTE — H&P (Signed)
Subjective: Patient is a 66 y.o. male admitted for STBB. Onset of symptoms was a few weeks ago, gradually worsening since that time.  The pain is rated none.He noted loss of vision L eye and ophthal ordered brain MRI that showed large brain mass The pain is described as none. The symptoms have been progressive. Symptoms are exacerbated by nothing in particular. 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   Depression    Diabetes mellitus without complication ([place])    X7D 04/24/15- diet controlled    Diverticulosis    Hyperlipidemia    Hypertension    Myocardial infarction ([place])    Tobacco abuse     Past Surgical History:  Procedure Laterality Date   BACK SURGERY     CARDIAC CATHETERIZATION     COLONOSCOPY     colonoscopy with polypectomy     polyp X 1   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   EYE SURGERY     LEFT HEART CATH AND CORONARY ANGIOGRAPHY N/A 11/27/2020   Procedure: LEFT HEART CATH AND CORONARY ANGIOGRAPHY;  Surgeon: Lorretta Harp, MD;  Location: [facility];  Service: Cardiovascular;  Laterality: N/A;   MAXIMUM ACCESS (MAS)POSTERIOR LUMBAR INTERBODY FUSION (PLIF) 2 LEVEL N/A 05/14/2015   Procedure: Lumbar three-lumbar four,  umbar four-lumbar five Maximum access posterior lumbar interbody fusion;  Surgeon: Eustace Moore, MD;  Location: [facility];  Service: Neurosurgery;  Laterality: N/A;  L3-4 L4-5 Maximum access posterior lumbar interbody fusion   POLYPECTOMY     WISDOM TOOTH EXTRACTION      Prior to Admission medications   Medication Sig Start Date End Date Taking?  Authorizing Provider  dexamethasone (DECADRON) 4 MG tablet Take 4 mg by mouth 3 (three) times daily. 06/03/22  Yes [provider]  traMADol (ULTRAM) 50 MG tablet TAKE 1 TABLET BY MOUTH EVERY 12 HOURS AS NEEDED 06/15/22  Yes Burns, Claudina Lick, MD  aspirin EC 81 MG tablet Take 1 tablet (81 mg total) by mouth daily. Swallow whole. Patient not taking: Reported on 03/17/2022 01/18/22   Binnie Rail, MD  atorvastatin (LIPITOR) 80 MG tablet Take 1 tablet (80 mg total) by mouth daily. Patient not taking: Reported on 03/17/2022 01/18/22   Binnie Rail, MD  clopidogrel (PLAVIX) 75 MG tablet Take 1 tablet (75 mg total) by mouth daily with breakfast. Patient not taking: Reported on 03/17/2022 01/18/22   Binnie Rail, MD  metoprolol succinate (TOPROL-XL) 25 MG 24 hr tablet Take 1 tablet (25 mg total) by mouth daily. Patient not taking: Reported on 03/17/2022 01/18/22   Binnie Rail, MD  sertraline (ZOLOFT) 50 MG tablet Take 1 tablet (50 mg total) by mouth daily. Patient not taking: Reported on 03/17/2022 01/18/22   Binnie Rail, MD   Allergies  Allergen Reactions   Bupropion Hcl Rash    REACTION: rash @ pressure areas (waist, groin , axillae)   Penicillins Hives and Rash   Amoxicillin Itching and Rash  Social History   Tobacco Use   Smoking status: Every Day    Packs/day: 1.50    Years: 40.00    Total pack years: 60.00    Types: Cigarettes    Last attempt to quit: 04/19/2015    Years since quitting: 7.1   Smokeless tobacco: Never  Substance Use Topics   Alcohol use: Yes    Alcohol/week: 3.0 standard drinks of alcohol    Types: 3 Cans of beer per week    Comment: daily drinks- 12 pack on the weekends case/weekend,     Family History  Problem Relation Age of Onset   Diabetes Mother    Stroke Father    Melanoma Father    Stomach cancer Father    Alcohol abuse Father    Breast cancer Sister    Diabetes Brother    Lymphoma Brother    Colon cancer Neg Hx    Esophageal cancer Neg Hx     Rectal cancer Neg Hx    Colon polyps Neg Hx      Review of Systems  Positive ROS: neg  All other systems have been reviewed and were otherwise negative with the exception of those mentioned in the HPI and as above.  Objective: Vital signs in last 24 hours: Temp:  [97.7 F (36.5 C)] 97.7 F (36.5 C) (11/13 0943) Pulse Rate:  [56] 56 (11/13 0943) Resp:  [18] 18 (11/13 0943) BP: (127)/(76) 127/76 (11/13 0943) SpO2:  [98 %] 98 % (11/13 0943) Weight:  [75.3 kg] 75.3 kg (11/13 0943)  General Appearance: Alert, cooperative, no distress, appears stated age Head: Normocephalic, without obvious abnormality, atraumatic Eyes: PERRL, conjunctiva/corneas clear, EOM's intact    Neck: Supple, symmetrical, trachea midline Back: Symmetric, no curvature, ROM normal, no CVA tenderness Lungs:  respirations unlabored Heart: Regular rate and rhythm Abdomen: Soft, non-tender Extremities: Extremities normal, atraumatic, no cyanosis or edema Pulses: 2+ and symmetric all extremities Skin: Skin color, texture, turgor normal, no rashes or lesions  NEUROLOGIC:   Mental status: Alert and oriented x4,  no aphasia, good attention span, fund of knowledge, and memory Motor Exam - grossly normal Sensory Exam - grossly normal Reflexes: 1= Coordination - grossly normal Gait - grossly normal Balance - grossly normal Cranial Nerves: I: smell Not tested  II: visual acuity  OS: nl    OD: nl  II: visual fields Full to confrontation  II: pupils Equal, round, reactive to light  III,VII: ptosis None  III,IV,VI: extraocular muscles  Full ROM  V: mastication Normal  V: facial light touch sensation  Normal  V,VII: corneal reflex  Present  VII: facial muscle function - upper  Normal  VII: facial muscle function - lower Normal  VIII: hearing Not tested  IX: soft palate elevation  Normal  IX,X: gag reflex Present  XI: trapezius strength  5/5  XI: sternocleidomastoid strength 5/5  XI: neck flexion strength   5/5  XII: tongue strength  Normal    Data Review Lab Results  Component Value Date   WBC 14.7 (H) 06/17/2022   HGB 18.7 (H) 06/17/2022   HCT 53.6 (H) 06/17/2022   MCV 96.8 06/17/2022   PLT 178 06/17/2022   Lab Results  Component Value Date   NA 136 06/17/2022   K 4.3 06/17/2022   CL 101 06/17/2022   CO2 26 06/17/2022   BUN 28 (H) 06/17/2022   CREATININE 1.05 06/17/2022   GLUCOSE 153 (H) 06/17/2022   Lab Results  Component Value Date  INR 0.9 06/17/2022    Assessment/Plan:  Estimated body mass index is 22.51 kg/m as calculated from the following:   Height as of this encounter: 6' (1.829 m).   Weight as of this encounter: 75.3 kg. Patient admitted for STBB for brain mass. Patient has failed a reasonable attempt at conservative therapy.  I explained the condition and procedure to the patient and answered any questions.  Patient wishes to proceed with procedure as planned. Understands risks/ benefits and typical outcomes of procedure.   Eustace Moore 06/21/2022 11:12 AM

## 2022-06-21 NOTE — Progress Notes (Signed)
  Transition of Care [facility]) Screening Note   Patient Details  Name: JIVAN SYMANSKI Date of Birth: [birth year]   Transition of Care [facility]) CM/SW Contact:    Benard Halsted, LCSW Phone Number: 06/21/2022, 5:03 PM    Transition of Care Department [facility]) has reviewed patient. We will continue to monitor patient advancement through interdisciplinary progression rounds. If new patient transition needs arise, please place a TOC consult.

## 2022-06-22 ENCOUNTER — Encounter (HOSPITAL_COMMUNITY): Payer: Self-pay | Admitting: Neurological Surgery

## 2022-06-22 NOTE — Discharge Summary (Signed)
Physician Discharge Summary  Patient ID: Keith [name] MRN: [id number] DOB/AGE: 68-May-1957 68 y.o.  Admit date: 06/21/2022 Discharge date: 06/22/2022  Admission Diagnoses: brain mass    Discharge Diagnoses: same   Discharged Condition: stable  Hospital Course: The patient was admitted on 06/21/2022 and taken to the operating room where the patient underwent STBB. The patient tolerated the procedure well and was taken to the recovery room and then to the ICU in stable condition. The hospital course was routine. There were no complications. The wound remained clean dry and intact. Pt had appropriate head soreness. No complaints of arm pain or new N/T/W. The patient remained afebrile with stable vital signs, and tolerated a regular diet. The patient continued to increase activities, and pain was well controlled with oral pain medications.   Consults: None  Significant Diagnostic Studies:  Results for orders placed or performed during the hospital encounter of 06/21/22  Glucose, capillary  Result Value Ref Range   Glucose-Capillary 96 70 - 99 mg/dL  Glucose, capillary  Result Value Ref Range   Glucose-Capillary 114 (H) 70 - 99 mg/dL    MR BRAIN W WO CONTRAST  Result Date: 06/03/2022 CLINICAL DATA:  Brain mass EXAM: MRI HEAD WITHOUT AND WITH CONTRAST TECHNIQUE: Multiplanar, multiecho pulse sequences of the brain and surrounding structures were obtained without and with intravenous contrast. CONTRAST:  7.33m GADAVIST GADOBUTROL 1 MMOL/ML IV SOLN COMPARISON:  Outside hospital brain MRI 05/27/2019 FINDINGS: Brain: There is abnormal masslike T1 hypointensity centered in the left internal capsule/basal ganglia with extension to the temporal lobe posteriorly and middle cerebral peduncle and pons inferiorly. The mass is predominantly nonenhancing; however, there is curvilinear enhancement in the peritrigonal region (8-88). There is elevated DWI signal on the outside hospital study; however,  most of this reflects T2 shine through with only a small area of true diffusion restriction. Small foci of hemorrhage or calcification are seen on the outside hospital brain MRI from 05/26/2022, with a small amount of curvilinear intrinsic T1 hyperintensity on the current study (5-31. The mass results in partial effacement of the left lateral ventricle but no frank midline shift. There is no other abnormal enhancement. The ventricles are otherwise normal in size. Background parenchymal volume is normal. Remote lacunar infarcts are seen in the bilateral basal ganglia, right thalamus, and right cerebellar hemisphere. Vascular: The vasculature is grossly unremarkable. Skull and upper cervical spine: Normal marrow signal. Sinuses/Orbits: The paranasal sinuses are clear. The globes and orbits are unremarkable. Other: None. IMPRESSION: Infiltrative mass centered in the left basal ganglia extending to the brainstem with a small enhancement favored to reflect primary glioma. Electronically Signed   By: PValetta MoleM.D.   On: 06/03/2022 17:00    Antibiotics:  Anti-infectives (From admission, onward)    Start     Dose/Rate Route Frequency Ordered Stop   06/21/22 0945  vancomycin (VANCOCIN) IVPB 1000 mg/200 mL premix        1,000 mg 200 mL/hr over 60 Minutes Intravenous On call to O.R. 06/21/22 0930 06/21/22 1122       Discharge Exam: Blood pressure 114/62, pulse (!) 48, temperature 98.4 F (36.9 C), temperature source Oral, resp. rate 19, height 6' (1.829 m), weight 75.3 kg, SpO2 94 %. Neurologic: Grossly normal Dressing dry  Discharge Medications:   Allergies as of 06/22/2022       Reactions   Bupropion Hcl Rash   REACTION: rash @ pressure areas (waist, groin , axillae)   Penicillins Hives, Rash  Amoxicillin Itching, Rash        Medication List     TAKE these medications    aspirin EC 81 MG tablet Take 1 tablet (81 mg total) by mouth daily. Swallow whole.   atorvastatin 80 MG  tablet Commonly known as: LIPITOR Take 1 tablet (80 mg total) by mouth daily.   clopidogrel 75 MG tablet Commonly known as: PLAVIX Take 1 tablet (75 mg total) by mouth daily with breakfast.   dexamethasone 4 MG tablet Commonly known as: DECADRON Take 4 mg by mouth 3 (three) times daily.   metoprolol succinate 25 MG 24 hr tablet Commonly known as: TOPROL-XL Take 1 tablet (25 mg total) by mouth daily.   sertraline 50 MG tablet Commonly known as: ZOLOFT Take 1 tablet (50 mg total) by mouth daily.   traMADol 50 MG tablet Commonly known as: ULTRAM TAKE 1 TABLET BY MOUTH EVERY 12 HOURS AS NEEDED        Disposition: home   Final Dx: STBB  Discharge Instructions     Call MD for:  difficulty breathing, headache or visual disturbances   Complete by: As directed    Call MD for:  persistant nausea and vomiting   Complete by: As directed    Call MD for:  redness, tenderness, or signs of infection (pain, swelling, redness, odor or green/yellow discharge around incision site)   Complete by: As directed    Call MD for:  severe uncontrolled pain   Complete by: As directed    Call MD for:  temperature >100.4   Complete by: As directed    Diet - low sodium heart healthy   Complete by: As directed    Increase activity slowly   Complete by: As directed    Remove dressing in 24 hours   Complete by: As directed         Follow-up Information     Eustace Moore, MD. Schedule an appointment as soon as possible for a visit in 2 week(s).   Specialty: Neurosurgery Contact information: [address] [place] [address] [phone]                  Signed: Eustace Moore 06/22/2022, 7:22 AM

## 2022-06-22 NOTE — Progress Notes (Signed)
Patient dressed and ready for discharge. PIV removed, primary RN went over paperwork with patient. Patient assisted to wheelchair and discharged home with daughter.

## 2022-06-25 ENCOUNTER — Encounter: Payer: Self-pay | Admitting: *Deleted

## 2022-06-25 ENCOUNTER — Telehealth: Payer: Self-pay | Admitting: *Deleted

## 2022-06-25 NOTE — Patient Outreach (Signed)
  Care Coordination [facility] Note Transition Care Management Follow-up Telephone Call Date of discharge and from where: Tuesday, 06/22/22 Keith [name]; brain mass with biopsy How have you been since you were released from the hospital? "I guess I am doing okay, things are about the same as they were before.  There is nothing you can help me with;" patient obviously irritated by [facility] call today and refuses to engage with TOC questions/ SDOH assessment; patient ended up hanging up call abruptly Any questions or concerns? No  Items Reviewed: Did the pt receive and understand the discharge instructions provided?  Unable to determine Medications obtained and verified?  Unable to determine Other? No  Any new allergies since your discharge?  Unable to determine Dietary orders reviewed? No Do you have support at home?  Unable to determine  Home Care and Equipment/Supplies: Were home health services ordered? Unable to determine- none noted from review of EHR If so, what is the name of the agency? N/A  Has the agency set up a time to come to the patient's home? not applicable Were any new equipment or medical supplies ordered?  No - unable to determine What is the name of the medical supply agency? N/A Were you able to get the supplies/equipment? not applicable Do you have any questions related to the use of the equipment or supplies? No N/A  Functional Questionnaire: (I = Independent and D = Dependent) ADLs: unable to determine, patient refused to provide details  Bathing/Dressing- unable to determine, patient refused to provide details  Meal Prep- unable to determine, patient refused to provide details  Eating- unable to determine, patient refused to provide details  Maintaining continence- unable to determine, patient refused to provide details  Transferring/Ambulation- unable to determine, patient refused to provide details  Managing Meds- unable to determine, patient refused to provide  details  Follow up appointments reviewed:  PCP Hospital f/u appt confirmed? No  Scheduled to see - on - @ [facility] f/u appt confirmed? Yes  Scheduled to see neurosurgeon on "in a couple of weeks-- I don't know when exactly" Are transportation arrangements needed?  Unable to determine- patient refused to engage with SDOH assessments If their condition worsens, is the pt aware to call PCP or go to the Emergency Dept.? No- unable to determine Was the patient provided with contact information for the PCP's office or ED? No Was to pt encouraged to call back with questions or concerns? No- patient abruptly ended/ hung up call without allowing for response  SDOH assessments and interventions completed:   No patient refused SDOH assessment  Care Coordination Interventions Activated:  No   Care Coordination Interventions:  No Care Coordination interventions needed at this time.   Encounter Outcome:  Pt. Visit Completed    Oneta Rack, RN, BSN, CCRN Alumnus RN CM Care Coordination/ Transition of [place] Management [phone]: direct office

## 2022-06-28 ENCOUNTER — Inpatient Hospital Stay: Payer: Medicare Other | Attending: Neurological Surgery

## 2022-07-13 ENCOUNTER — Encounter (HOSPITAL_COMMUNITY): Payer: Self-pay

## 2022-07-15 ENCOUNTER — Other Ambulatory Visit: Payer: Self-pay

## 2022-07-15 ENCOUNTER — Inpatient Hospital Stay: Payer: Medicare Other | Attending: Neurological Surgery | Admitting: Internal Medicine

## 2022-07-15 VITALS — BP 156/84 | HR 89 | Temp 97.9°F | Resp 18 | Ht 72.0 in | Wt 167.2 lb

## 2022-07-15 DIAGNOSIS — C719 Malignant neoplasm of brain, unspecified: Secondary | ICD-10-CM | POA: Insufficient documentation

## 2022-07-15 DIAGNOSIS — F17210 Nicotine dependence, cigarettes, uncomplicated: Secondary | ICD-10-CM | POA: Diagnosis not present

## 2022-07-15 DIAGNOSIS — Z7982 Long term (current) use of aspirin: Secondary | ICD-10-CM | POA: Diagnosis not present

## 2022-07-15 DIAGNOSIS — Z79899 Other long term (current) drug therapy: Secondary | ICD-10-CM | POA: Insufficient documentation
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
## 2022-07-16 ENCOUNTER — Other Ambulatory Visit: Payer: Self-pay | Admitting: Internal Medicine

## 2022-07-21 ENCOUNTER — Encounter (HOSPITAL_COMMUNITY): Payer: Self-pay

## 2022-07-21 LAB — SURGICAL PATHOLOGY

## 2022-08-05 NOTE — Progress Notes (Incomplete)
Location/Histology of Brain Tumor:  High grade glioma not classifiable by [facility] criteria  Patient presented with symptoms of:  from Dr. Renda Rolls 06/01/22 office note: "He describes ~1 month history of "blurry vision in right side of left eye", now with some involvement of the right eye on the right side. He also describes "dropping cigarrettes" during the same period of time. No other frank weakness, dizziness, gait impairment, seizures, headaches. CNS imaging demonstrate left hemispheric mass c/w likely primary brain tumor."  MRI Brain w/ & w/o Contrast  06/03/2022 --IMPRESSION: Infiltrative mass centered in the left basal ganglia extending to the brainstem with a small enhancement favored to reflect primary glioma  Past or anticipated interventions, if any, per neurosurgery:  06/21/2022 --Dr. Sherley Bounds Left parietal stereotactic brain biopsy utilizing Stealth frameless stereotactic image guidance   Past or anticipated interventions, if any, per medical oncology:  Under care of Dr. Cecil Cobbs 07/15/2022 Noralee Chars presents today with mild progression of symptoms related to left hemispheric glioma with brainstem infiltration.   Biopsy did not uncover WHO grade, but glioma appears to be IDH wild type, conferring high grade genotype.   Further NGS is pending. We discussed and recommended intensity modulated radiation therapy with concurrent Temozolomide for high grade glioma.   He is unsure at this point if he wants to pursue any life-prolonging treatment.   He would like to take some time to think things over at home with his family.   Noralee Chars will reach out to [place] via phone with goals of care decision.  Dose of Decadron, if applicable: Not currently prescribed  Recent neurologic symptoms, if any:  Seizures: {:18581} Headaches: {:18581} Nausea: {:18581} Dizziness/ataxia: {:18581} Difficulty with hand coordination: {:18581} Focal numbness/weakness: {:18581} Visual  deficits/changes: {:18581} Confusion/Memory deficits: {:18581}  SAFETY ISSUES: Prior radiation? *** Pacemaker/ICD? *** Possible current pregnancy? N/A Is the patient on methotrexate? ***  Additional Complaints / other details: ***

## 2022-08-10 ENCOUNTER — Ambulatory Visit: Payer: Medicare Other

## 2022-08-10 ENCOUNTER — Telehealth: Payer: Self-pay | Admitting: Radiation Oncology

## 2022-08-10 ENCOUNTER — Ambulatory Visit
Admission: RE | Admit: 2022-08-10 | Discharge: 2022-08-10 | Disposition: A | Discharge status: Home / Self Care | Payer: Medicare Other | Source: Ambulatory Visit | Attending: Radiation Oncology | Admitting: Radiation Oncology

## 2022-08-10 DIAGNOSIS — C719 Malignant neoplasm of brain, unspecified: Secondary | ICD-10-CM

## 2022-08-10 NOTE — Telephone Encounter (Signed)
Called patient and patient's wife to r/s missed consultation w. Dr. Lisbeth Renshaw. No answer, LVM for a return call.

## 2022-08-10 NOTE — Progress Notes (Incomplete)
Radiation Oncology         [phone] ________________________________  Name: Keith [name]        MRN: [id number]  Date of Service: 08/10/2022 DOB: 1956-04-27  KC:LEXNT, Claudina Lick, MD  Eustace Moore, MD     REFERRING PHYSICIAN: Eustace Moore, MD   DIAGNOSIS: The encounter diagnosis was High grade glioma not classifiable by [facility] criteria [facility]).   HISTORY OF PRESENT ILLNESS: Keith [name] is a 67 y.o. male seen at the request of Dr. 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  Patient initially presented with blurry vision and the right side of his left eye and difficulty controlling the muscles in his lip. He proceeded with an MRI of the brain on 06/03/2022 which showed an infiltrative mass in the left basal ganglia extending to the brainstem with small enhancement without distinct measurements given.  He underwent a stereotactic left brain biopsy on 06/21/2022 which showed an IDH wild type glioma.  A second opinion through [facility] confirmed grade 4 IDH wild-type glioblastoma.  He is seen to discuss treatment for his disease.  He did meet with Dr. Mickeal Skinner who has recommended chemoradiation, however he wanted to reconsider his options with his family and wanted to delay further discussion until after the holidays.  He is seen to discuss the potential course of chemoradiation.    PREVIOUS RADIATION THERAPY: {EXAM; YES/NO:19492::"No"}   PAST MEDICAL HISTORY:  Past Medical History:  Diagnosis Date   Anxiety    Cataract    beginnings    Chronic kidney disease 04/23/2015   ARF- "resolved"   Colon polyp    COPD (chronic obstructive pulmonary disease) ([place])    per pt from smoking    Coronary artery disease    Dehydration 04/2015   Hospitalized for 4 days after abnormal labs found during preop appointment.   Depression    Diabetes mellitus without complication ([place])    Z0Y 04/24/15- diet controlled    Diverticulosis    Hyperlipidemia     Hypertension    Myocardial infarction [facility])    Tobacco abuse        PAST SURGICAL HISTORY: Past Surgical History:  Procedure Laterality Date   APPLICATION OF CRANIAL NAVIGATION Left 06/21/2022   Procedure: APPLICATION OF CRANIAL NAVIGATION;  Surgeon: Eustace Moore, MD;  Location: [place];  Service: Neurosurgery;  Laterality: Left;   BACK SURGERY     BRAIN BIOPSY Left 06/21/2022   Procedure: Left Stereotactic Brain Biopsy;  Surgeon: Eustace Moore, MD;  Location: [place];  Service: Neurosurgery;  Laterality: Left;   CARDIAC CATHETERIZATION     COLONOSCOPY     colonoscopy with polypectomy     polyp X 1   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   EYE SURGERY     LEFT HEART CATH AND CORONARY ANGIOGRAPHY N/A 11/27/2020   Procedure: LEFT HEART CATH AND CORONARY ANGIOGRAPHY;  Surgeon: Lorretta Harp, MD;  Location: [place] CV LAB;  Service: Cardiovascular;  Laterality: N/A;   MAXIMUM ACCESS (MAS)POSTERIOR LUMBAR INTERBODY FUSION (PLIF) 2 LEVEL N/A 05/14/2015   Procedure: Lumbar three-lumbar four,  umbar four-lumbar five Maximum access posterior lumbar interbody fusion;  Surgeon: Eustace Moore, MD;  Location: [place] NEURO ORS;  Service: Neurosurgery;  Laterality: N/A;  L3-4 L4-5 Maximum access posterior lumbar interbody fusion   POLYPECTOMY     WISDOM TOOTH EXTRACTION  FAMILY HISTORY:  Family History  Problem Relation Age of Onset   Diabetes Mother    Stroke Father    Melanoma Father    Stomach cancer Father    Alcohol abuse Father    Breast cancer Sister    Diabetes Brother    Lymphoma Brother    Colon cancer Neg Hx    Esophageal cancer Neg Hx    Rectal cancer Neg Hx    Colon polyps Neg Hx      SOCIAL HISTORY:  reports that he has been smoking cigarettes. He has a 60.00 pack-year smoking history. He has never used smokeless tobacco. He reports current alcohol use of about 3.0 standard drinks of alcohol per week. He reports that he does not use drugs.   The patient is married and lives in [place].   ALLERGIES: Bupropion hcl, Penicillins, and Amoxicillin   MEDICATIONS:  Current Outpatient Medications  Medication Sig Dispense Refill   aspirin EC 81 MG tablet Take 1 tablet (81 mg total) by mouth daily. Swallow whole. (Patient not taking: Reported on 03/17/2022) 90 tablet 3   atorvastatin (LIPITOR) 80 MG tablet Take 1 tablet (80 mg total) by mouth daily. (Patient not taking: Reported on 03/17/2022) 90 tablet 2   clopidogrel (PLAVIX) 75 MG tablet Take 1 tablet (75 mg total) by mouth daily with breakfast. (Patient not taking: Reported on 03/17/2022) 90 tablet 2   metoprolol succinate (TOPROL-XL) 25 MG 24 hr tablet Take 1 tablet (25 mg total) by mouth daily. (Patient not taking: Reported on 03/17/2022) 90 tablet 3   sertraline (ZOLOFT) 50 MG tablet Take 1 tablet (50 mg total) by mouth daily. (Patient not taking: Reported on 03/17/2022) 30 tablet 5   traMADol (ULTRAM) 50 MG tablet TAKE 1 TABLET BY MOUTH EVERY 12 HOURS AS NEEDED 60 tablet 0   No current facility-administered medications for this encounter.     REVIEW OF SYSTEMS: On review of systems, the patient reports that ***      PHYSICAL EXAM:  Wt Readings from Last 3 Encounters:  07/15/22 167 lb 3.2 oz (75.8 kg)  06/21/22 166 lb (75.3 kg)  06/17/22 166 lb 3.2 oz (75.4 kg)   Temp Readings from Last 3 Encounters:  07/15/22 97.9 F (36.6 C) (Temporal)  06/22/22 98.3 F (36.8 C) (Oral)  06/17/22 97.9 F (36.6 C)   BP Readings from Last 3 Encounters:  07/15/22 (!) 156/84  06/22/22 132/71  06/17/22 (!) 159/94   Pulse Readings from Last 3 Encounters:  07/15/22 89  06/22/22 (!) 48  06/17/22 62    /10  In general this is a well appearing Caucasian male in no acute distress.  He's alert and oriented x4 and appropriate throughout the examination. Cardiopulmonary assessment is negative for acute distress and he exhibits normal effort.     ECOG = ***  0 - Asymptomatic (Fully active,  able to carry on all predisease activities without restriction)  1 - Symptomatic but completely ambulatory (Restricted in physically strenuous activity but ambulatory and able to carry out work of a light or sedentary nature. For example, light housework, office work)  2 - Symptomatic, <50% in bed during the day (Ambulatory and capable of all self care but unable to carry out any work activities. Up and about more than 50% of waking hours)  3 - Symptomatic, >50% in bed, but not bedbound (Capable of only limited self-care, confined to bed or chair 50% or more of waking hours)  4 - Bedbound (Completely  disabled. Cannot carry on any self-care. Totally confined to bed or chair)  5 - Death   Eustace Pen MM, Creech RH, Tormey DC, et al. [phone]). "Toxicity and response criteria of the [facility]". [place] Oncol. 5 (6): 649-55    LABORATORY DATA:  Lab Results  Component Value Date   WBC 14.7 (H) 06/17/2022   HGB 18.7 (H) 06/17/2022   HCT 53.6 (H) 06/17/2022   MCV 96.8 06/17/2022   PLT 178 06/17/2022   Lab Results  Component Value Date   NA 136 06/17/2022   K 4.3 06/17/2022   CL 101 06/17/2022   CO2 26 06/17/2022   Lab Results  Component Value Date   ALT 10 09/16/2021   AST 14 09/16/2021   ALKPHOS 64 09/16/2021   BILITOT 0.6 09/16/2021      RADIOGRAPHY: No results found.     IMPRESSION/PLAN: 1. Glioblastoma involving the left basal ganglia extending into the brainstem.Dr. Lisbeth Renshaw discusses the pathology findings and reviews the nature of glioblastoma and the treatment involving chemoradiation.. We discussed the risks, benefits, short, and long term effects of radiotherapy, as well as the curative intent, and the patient is interested in proceeding. Dr. Lisbeth Renshaw discusses the delivery and logistics of radiotherapy and anticipates a course of 6 weeks of radiotherapy. Written consent is obtained and placed in the chart, a copy was provided to the patient. The patient  will be contacted to coordinate treatment planning by our simulation department.    In a visit lasting *** minutes, greater than 50% of the time was spent face to face discussing the patient's condition, in preparation for the discussion, and coordinating the patient's care.   The above documentation reflects my direct findings during this shared patient visit. Please see the separate note by Dr. Lisbeth Renshaw on this date for the remainder of the patient's plan of care.    Carola Rhine, [facility]   **Disclaimer: This note was dictated with voice recognition software. Similar sounding words can inadvertently be transcribed and this note may contain transcription errors which may not have been corrected upon publication of note.**

## 2022-08-11 ENCOUNTER — Telehealth: Payer: Self-pay | Admitting: Radiation Oncology

## 2022-08-11 NOTE — Telephone Encounter (Signed)
Sent no show letter to patient 1/3.

## 2022-08-11 NOTE — Telephone Encounter (Signed)
Called patient and patient's wife to r/s missed consultation w. Dr. Lisbeth Renshaw. No answer, LVM for a return call.

## 2022-08-13 ENCOUNTER — Emergency Department (HOSPITAL_COMMUNITY)
Admission: EM | Admit: 2022-08-13 | Discharge: 2022-08-13 | Disposition: A | Discharge status: Home / Self Care | Payer: Medicare Other | Attending: Emergency Medicine | Admitting: Emergency Medicine

## 2022-08-13 DIAGNOSIS — R404 Transient alteration of awareness: Secondary | ICD-10-CM | POA: Diagnosis not present

## 2022-08-13 DIAGNOSIS — R630 Anorexia: Secondary | ICD-10-CM | POA: Insufficient documentation

## 2022-08-13 DIAGNOSIS — Z7902 Long term (current) use of antithrombotics/antiplatelets: Secondary | ICD-10-CM | POA: Insufficient documentation

## 2022-08-13 DIAGNOSIS — R627 Adult failure to thrive: Secondary | ICD-10-CM | POA: Diagnosis not present

## 2022-08-13 DIAGNOSIS — Z79899 Other long term (current) drug therapy: Secondary | ICD-10-CM | POA: Insufficient documentation

## 2022-08-13 DIAGNOSIS — I10 Essential (primary) hypertension: Secondary | ICD-10-CM | POA: Diagnosis not present

## 2022-08-13 DIAGNOSIS — Z7982 Long term (current) use of aspirin: Secondary | ICD-10-CM | POA: Insufficient documentation

## 2022-08-13 DIAGNOSIS — Z743 Need for continuous supervision: Secondary | ICD-10-CM | POA: Diagnosis not present

## 2022-08-13 LAB — CBC WITH DIFFERENTIAL/PLATELET
Abs Immature Granulocytes: 0.02 10*3/uL (ref 0.00–0.07)
Basophils Absolute: 0.1 10*3/uL (ref 0.0–0.1)
Basophils Relative: 1 %
Eosinophils Absolute: 0.3 10*3/uL (ref 0.0–0.5)
Eosinophils Relative: 4 %
HCT: 47.8 % (ref 39.0–52.0)
Hemoglobin: 16.4 g/dL (ref 13.0–17.0)
Immature Granulocytes: 0 %
Lymphocytes Relative: 24 %
Lymphs Abs: 2 10*3/uL (ref 0.7–4.0)
MCH: 32.3 pg (ref 26.0–34.0)
MCHC: 34.3 g/dL (ref 30.0–36.0)
MCV: 94.1 fL (ref 80.0–100.0)
Monocytes Absolute: 0.9 10*3/uL (ref 0.1–1.0)
Monocytes Relative: 10 %
Neutro Abs: 5 10*3/uL (ref 1.7–7.7)
Neutrophils Relative %: 61 %
Platelets: 200 10*3/uL (ref 150–400)
RBC: 5.08 MIL/uL (ref 4.22–5.81)
RDW: 13.2 % (ref 11.5–15.5)
WBC: 8.2 10*3/uL (ref 4.0–10.5)
nRBC: 0 % (ref 0.0–0.2)

## 2022-08-13 LAB — COMPREHENSIVE METABOLIC PANEL
ALT: 12 U/L (ref 0–44)
AST: 15 U/L (ref 15–41)
Albumin: 3.9 g/dL (ref 3.5–5.0)
Alkaline Phosphatase: 50 U/L (ref 38–126)
Anion gap: 15 (ref 5–15)
BUN: 20 mg/dL (ref 8–23)
CO2: 22 mmol/L (ref 22–32)
Calcium: 9.1 mg/dL (ref 8.9–10.3)
Chloride: 100 mmol/L (ref 98–111)
Creatinine, Ser: 1.22 mg/dL (ref 0.61–1.24)
GFR, Estimated: >60 mL/min (ref >60)
Glucose, Bld: 91 mg/dL (ref 70–99)
Potassium: 4.1 mmol/L (ref 3.5–5.1)
Sodium: 137 mmol/L (ref 135–145)
Total Bilirubin: 1.3 mg/dL — ABNORMAL HIGH (ref 0.3–1.2)
Total Protein: 7.2 g/dL (ref 6.5–8.1)

## 2022-08-13 MED ORDER — LACTATED RINGERS IV BOLUS
1000.0000 mL | Freq: Once | INTRAVENOUS | Status: AC
  Administered 2022-08-13: 1000 mL via INTRAVENOUS

## 2022-08-13 NOTE — ED Notes (Signed)
[facility] called for transport/ wife called with update.

## 2022-08-13 NOTE — Progress Notes (Signed)
Transition of Care [facility]) - Emergency Department Mini Assessment   Patient Details  Name: Keith [name] MRN: [id number] Date of Birth: 11-09-1955  Transition of Care [facility]) CM/SW Contact:    Rodney Booze, LCSW Phone Number: 08/13/2022, 6:04 PM   Clinical Narrative: CSW spoke to the patient at bedside, that patient does not want home health at this time. The patient stated that his wife cares for him and she is older than him. The patient also stated that they were going to set some [place] up from home if need be.    ED Mini Assessment: What brought you to the Emergency Department? : (P) Patient's wife needed help  Barriers to Discharge: (P) No Barriers Identified        Interventions which prevented an admission or readmission: Home Health Consult or Services    Patient Contact and Communications     Spoke with: (P) Patient Contact Date: (P) 08/13/22,                 Admission diagnosis:  FTT for 6 Weeks Patient Active Problem List   Diagnosis Date Noted   High grade glioma not classifiable by WHO criteria ([place]) 07/15/2022   Status post stereotactic brain biopsy 06/21/2022   Brain mass 06/01/2022   Lung nodule 09/16/2021   CAD (coronary artery disease) 12/03/2020   NSTEMI (non-ST elevated myocardial infarction) ([place]) 11/27/2020   Burning pain 01/14/2020   TFCC (triangular fibrocartilage complex) injury, left, initial encounter 09/05/2017   Polyarthropathy 09/05/2017   Arthritis of left wrist 08/05/2017   Left wrist pain 07/08/2017   Depression 11/09/2015   S/P lumbar spinal fusion 05/14/2015   Alcohol abuse 04/23/2015   DM (diabetes mellitus) type II controlled with renal manifestation ([place]) 04/23/2015   Lumbar radiculopathy 01/23/2015   CARPAL TUNNEL SYNDROME 12/22/2009   HYPERPLASIA PROSTATE UNS W/O UR OBST & OTH LUTS 12/22/2009   DIVERTICULOSIS, COLON 10/03/2008   COLONIC POLYPS, HX OF 10/03/2008   Hyperlipidemia 08/02/2007   Tobacco dependence due to  cigarettes 02/06/2007   Essential hypertension 02/06/2007   PCP:  Binnie Rail, MD Pharmacy:   [facility] [place] [address] [place] [address] Phone: [phone] Fax: [phone]  [id number] [address] [address]Phone: [phone]: [phone]

## 2022-08-13 NOTE — ED Triage Notes (Addendum)
BIB EMS from home, he has brain tumor, terminal, (high grade glioma), alert and oriented, ambulatory. No new complaints, just FTT. Feels like his family can no longer care for him. Family willing to consider hospice. Wife needs some respite care. Patient without complaints.

## 2022-08-13 NOTE — Progress Notes (Addendum)
CSW spoke to the wife, The wife is aware that the husband is coming. CSW set up home health with [facility]. Also let the wife know.

## 2022-08-13 NOTE — ED Provider Notes (Signed)
[facility] DEPT Provider Note   CSN: [id number] Arrival date & time: 08/13/22  1604     History  Chief Complaint  Patient presents with   Failure To Thrive    Keith [name] is a 67 y.o. male.  HPI 67 year old male with a brain tumor who is currently refusing treatment occluding radiation presents needing help at home and ideally hospice.  History is from patient and as well as his wife from over the phone.  Patient has had progressive decline including decreased appetite and generalized weakness/no energy.  Wife states he is also been a little more confused over the last week or so.  He has refused all of his meds for quite some time.  The patient denies headaches.  He has chronic balance problems and due to his wife's size when he falls she cannot help get him up.  Thus he is asking for help at home.  He would ideally like to be treated at home.  He denies any acute infectious symptoms such as a new cough or fever.  Home Medications Prior to Admission medications   Medication Sig Start Date End Date Taking? Authorizing Provider  aspirin EC 81 MG tablet Take 1 tablet (81 mg total) by mouth daily. Swallow whole. Patient not taking: Reported on 03/17/2022 01/18/22   Binnie Rail, MD  atorvastatin (LIPITOR) 80 MG tablet Take 1 tablet (80 mg total) by mouth daily. Patient not taking: Reported on 03/17/2022 01/18/22   Binnie Rail, MD  clopidogrel (PLAVIX) 75 MG tablet Take 1 tablet (75 mg total) by mouth daily with breakfast. Patient not taking: Reported on 03/17/2022 01/18/22   Binnie Rail, MD  metoprolol succinate (TOPROL-XL) 25 MG 24 hr tablet Take 1 tablet (25 mg total) by mouth daily. Patient not taking: Reported on 03/17/2022 01/18/22   Binnie Rail, MD  sertraline (ZOLOFT) 50 MG tablet Take 1 tablet (50 mg total) by mouth daily. Patient not taking: Reported on 03/17/2022 01/18/22   Binnie Rail, MD  traMADol (ULTRAM) 50 MG tablet TAKE 1 TABLET BY MOUTH  EVERY 12 HOURS AS NEEDED 07/16/22   Binnie Rail, MD      Allergies    Bupropion hcl, Penicillins, and Amoxicillin    Review of Systems   Review of Systems  Constitutional:  Positive for appetite change and fatigue. Negative for fever.  Cardiovascular:  Negative for chest pain.  Gastrointestinal:  Negative for vomiting.  Musculoskeletal:  Positive for gait problem.  Neurological:  Positive for weakness. Negative for headaches.    Physical Exam Updated Vital Signs BP (!) 148/92   Pulse 99   Temp 98.1 F (36.7 C) (Oral)   Resp 15   SpO2 97%  Physical Exam Vitals and nursing note reviewed.  Constitutional:      General: He is not in acute distress.    Appearance: He is well-developed. He is not ill-appearing or diaphoretic.  HENT:     Head: Normocephalic and atraumatic.  Cardiovascular:     Rate and Rhythm: Normal rate and regular rhythm.     Heart sounds: Normal heart sounds.  Pulmonary:     Effort: Pulmonary effort is normal.     Breath sounds: Normal breath sounds.  Abdominal:     General: There is no distension.     Palpations: Abdomen is soft.     Tenderness: There is no abdominal tenderness.  Skin:    General: Skin is warm and dry.  Neurological:     Mental Status: He is alert.     ED Results / Procedures / Treatments   Labs (all labs ordered are listed, but only abnormal results are displayed) Labs Reviewed  COMPREHENSIVE METABOLIC PANEL - Abnormal; Notable for the following components:      Result Value   Total Bilirubin 1.3 (*)    All other components within normal limits  CBC WITH DIFFERENTIAL/PLATELET    EKG EKG Interpretation  Date/Time:  Friday August 13 2022 17:33:06 EST Ventricular Rate:  81 PR Interval:  128 QRS Duration: 86 QT Interval:  386 QTC Calculation: 448 R Axis:   -40 Text Interpretation: Normal sinus rhythm Left axis deviation  nonspecific T waves. no significant change compared to Dec 2022 Confirmed by Sherwood Gambler  [phone]) on 08/13/2022 7:04:45 PM  Radiology No results found.  Procedures Procedures    Medications Ordered in ED Medications  lactated ringers bolus 1,000 mL (1,000 mLs Intravenous New Bag/Given 08/13/22 1812)    ED Course/ Medical Decision Making/ A&P                           Medical Decision Making Amount and/or Complexity of Data Reviewed Independent Historian: spouse External Data Reviewed: notes. Labs: ordered.    Details: Unremarkable electrolytes.  Normal WBC/hemoglobin. ECG/medicine tests: independent interpretation performed.    Details: No acute ischemia.   Patient presents with progressive failure to thrive.  At this point, he is hemodynamically stable.  There is no acute medical emergency to warrant with admission such as renal failure or significant electrolyte disturbance, especially in the setting of not wanting any therapy for his brain tumor.  Thus I discussed with palliative care who states this is primarily a TOC problem to get help at home.  I also discussed with wife over the phone.  Social worker has helped arrange home health and patient will go back home with the wife.  Stable for discharge.        Final Clinical Impression(s) / ED Diagnoses Final diagnoses:  Failure to thrive in adult    Rx / DC Orders ED Discharge Orders          [place]        08/13/22 1826    Face-to-face encounter (required for Medicare/Medicaid patients)  Status:  Canceled       Comments: I Ephraim Hamburger certify that this patient is under my care and that I, or a nurse practitioner or physician's assistant working with me, had a face-to-face encounter that meets the physician face-to-face encounter requirements with this patient on 08/13/2022. The encounter with the patient was in whole, or in part for the following medical condition(s) which is the primary reason for home health care (List medical condition): Brain tumor affection cognitive function/balance    08/13/22 [place]        08/13/22 1834    Face-to-face encounter (required for Medicare/Medicaid patients)       Comments: I Ephraim Hamburger certify that this patient is under my care and that I, or a nurse practitioner or physician's assistant working with me, had a face-to-face encounter that meets the physician face-to-face encounter requirements with this patient on 08/13/2022. The encounter with the patient was in whole, or in part for the following medical condition(s) which is the primary reason for home health care (List medical condition): Brain Tumor   08/13/22  [id number]              Sherwood Gambler, MD 08/13/22 [place]

## 2022-08-19 ENCOUNTER — Telehealth: Payer: Self-pay | Admitting: Radiation Oncology

## 2022-08-19 NOTE — Telephone Encounter (Signed)
Unable to contact patient, closing referral until further notice. Referring provider's office notified.

## 2022-09-03 IMAGING — CR DG CHEST 2V
2 series · 2 of 2 positions shown · non-contrast
Comparison: 04/24/2015

CLINICAL DATA: Bilateral anterior chest pain beneath the breast
areas since last night. Smoker.

EXAM:
CHEST - 2 VIEW

[w chest pa]
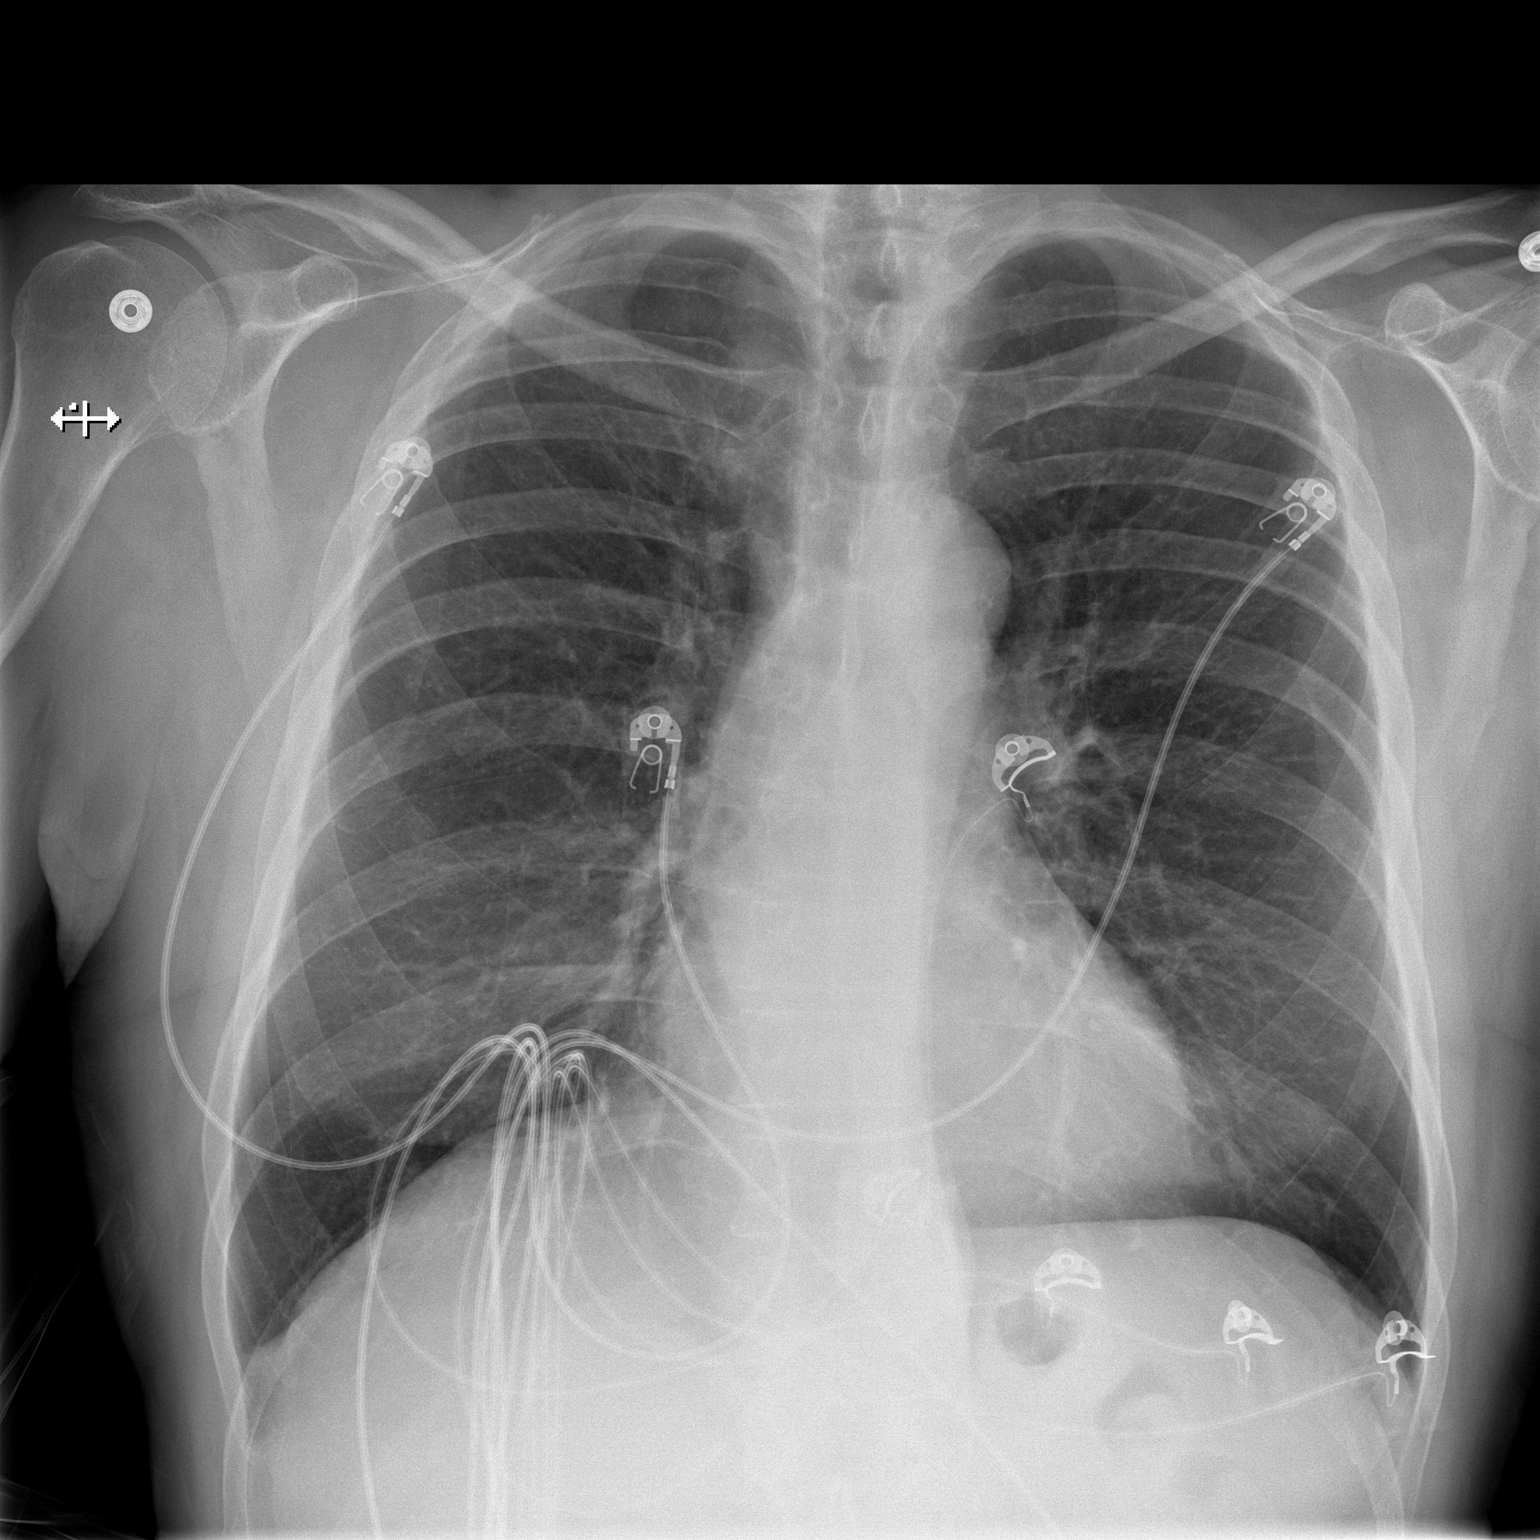

[w chest lat]
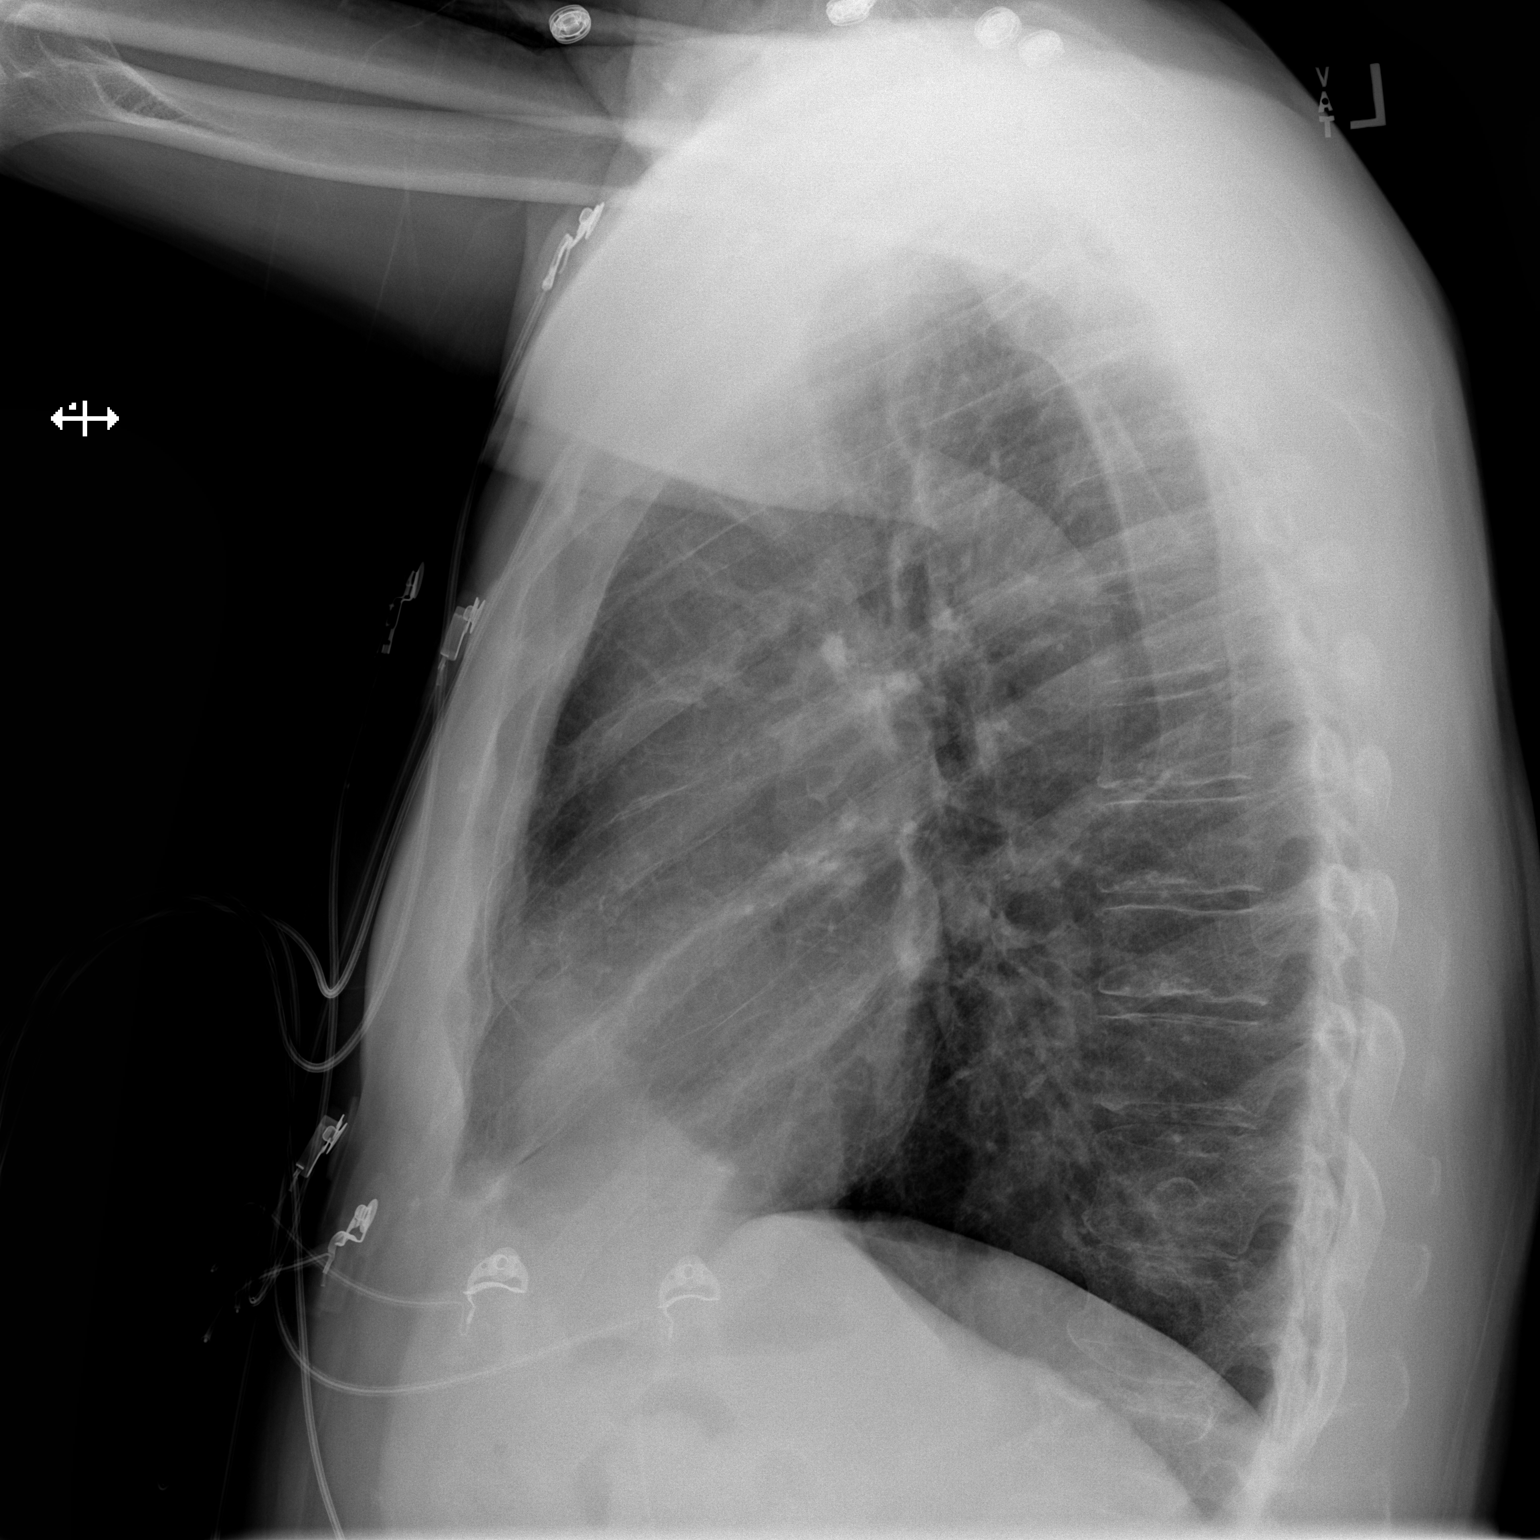

[2 of 2 positions shown; findings below may reference images not displayed]

FINDINGS: Normal sized heart. Lungs. Minimal peribronchial thickening. Stable
mild scoliosis.
IMPRESSION: Minimal bronchitic changes.

## 2022-09-14 NOTE — Progress Notes (Signed)
      Subjective:    Patient ID: Keith [name], male    DOB: 1956/01/23, 67 y.o.   MRN: [id number]     HPI Keith [name] is here for follow up of his chronic medical problems, including htn, CAD, DM, depression, glioma    Medications and allergies reviewed with patient and updated if appropriate.  Current Outpatient Medications on File Prior to Visit  Medication Sig Dispense Refill   aspirin EC 81 MG tablet Take 1 tablet (81 mg total) by mouth daily. Swallow whole. (Patient not taking: Reported on 03/17/2022) 90 tablet 3   atorvastatin (LIPITOR) 80 MG tablet Take 1 tablet (80 mg total) by mouth daily. (Patient not taking: Reported on 03/17/2022) 90 tablet 2   clopidogrel (PLAVIX) 75 MG tablet Take 1 tablet (75 mg total) by mouth daily with breakfast. (Patient not taking: Reported on 03/17/2022) 90 tablet 2   metoprolol succinate (TOPROL-XL) 25 MG 24 hr tablet Take 1 tablet (25 mg total) by mouth daily. (Patient not taking: Reported on 03/17/2022) 90 tablet 3   sertraline (ZOLOFT) 50 MG tablet Take 1 tablet (50 mg total) by mouth daily. (Patient not taking: Reported on 03/17/2022) 30 tablet 5   traMADol (ULTRAM) 50 MG tablet TAKE 1 TABLET BY MOUTH EVERY 12 HOURS AS NEEDED 60 tablet 0   No current facility-administered medications on file prior to visit.     Review of Systems     Objective:  There were no vitals filed for this visit. BP Readings from Last 3 Encounters:  08/13/22 (!) 164/84  07/15/22 (!) 156/84  06/22/22 132/71   Wt Readings from Last 3 Encounters:  07/15/22 167 lb 3.2 oz (75.8 kg)  06/21/22 166 lb (75.3 kg)  06/17/22 166 lb 3.2 oz (75.4 kg)   There is no height or weight on file to calculate BMI.    Physical Exam     Lab Results  Component Value Date   WBC 8.2 08/13/2022   HGB 16.4 08/13/2022   HCT 47.8 08/13/2022   PLT 200 08/13/2022   GLUCOSE 91 08/13/2022   CHOL 194 09/16/2021   TRIG 125.0 09/16/2021   HDL 52.70 09/16/2021   LDLCALC 117 (H) 09/16/2021    ALT 12 08/13/2022   AST 15 08/13/2022   NA 137 08/13/2022   K 4.1 08/13/2022   CL 100 08/13/2022   CREATININE 1.22 08/13/2022   BUN 20 08/13/2022   CO2 22 08/13/2022   TSH 0.96 01/19/2021   PSA 1.10 01/19/2021   INR 0.9 06/17/2022   HGBA1C 6.2 (H) 06/17/2022   MICROALBUR 1.1 11/10/2015     Assessment & Plan:    See Problem List for Assessment and Plan of chronic medical problems.   This encounter was created in error - please disregard.

## 2022-09-14 NOTE — Patient Instructions (Signed)
      Blood work was ordered.   The lab is on the first floor.    Medications changes include :       A referral was ordered for XXX.     Someone will call you to schedule an appointment.    No follow-ups on file.  

## 2022-09-15 ENCOUNTER — Encounter: Payer: Self-pay | Admitting: Internal Medicine

## 2022-09-15 ENCOUNTER — Encounter: Payer: Medicare Other | Admitting: Internal Medicine

## 2022-09-15 DIAGNOSIS — E1122 Type 2 diabetes mellitus with diabetic chronic kidney disease: Secondary | ICD-10-CM

## 2022-09-15 DIAGNOSIS — E7849 Other hyperlipidemia: Secondary | ICD-10-CM

## 2022-09-15 DIAGNOSIS — F3289 Other specified depressive episodes: Secondary | ICD-10-CM

## 2022-09-15 DIAGNOSIS — I10 Essential (primary) hypertension: Secondary | ICD-10-CM

## 2022-09-15 DIAGNOSIS — C719 Malignant neoplasm of brain, unspecified: Secondary | ICD-10-CM

## 2022-12-08 DEATH — deceased

## 2023-05-13 IMAGING — CT CT ANGIO CHEST
2 of 7 series · 17 of 46 positions shown · IV contrast (APPLIED)
Comparison: PA and lateral chest yesterday, PA and lateral chest
03/16/2021. no prior chest CT.

CLINICAL DATA: Positive D-dimer, chest pain and shortness of
breath.

EXAM:
CT ANGIOGRAPHY CHEST WITH CONTRAST
TECHNIQUE: Multidetector CT imaging of the chest was performed using the
standard protocol during bolus administration of intravenous
contrast. Multiplanar CT image reconstructions and MIPs were
obtained to evaluate the vascular anatomy.
CONTRAST:  61mL OMNIPAQUE IOHEXOL 350 MG/ML SOLN

[Series 6: thins · axial · 0.78mm/px · z∈[+1206,+1508]mm · 14 of 487 slices shown]
[im 28/487  lung]
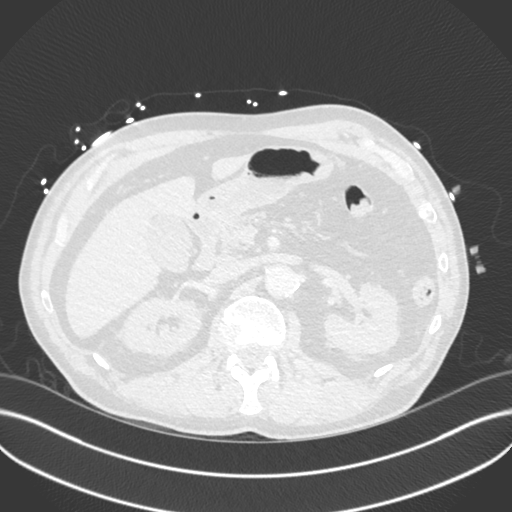
[im 55/487  soft-tissue]
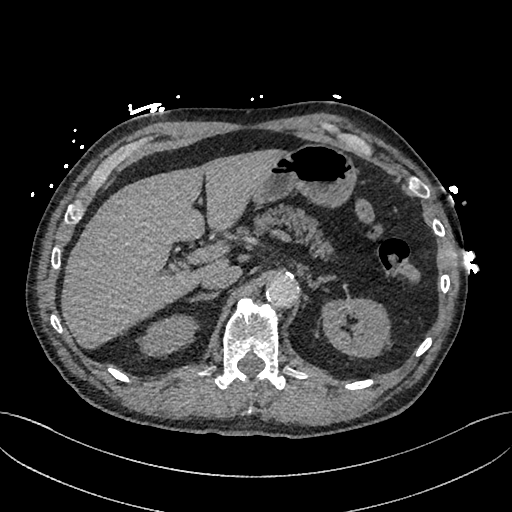
[im 109/487  lung]
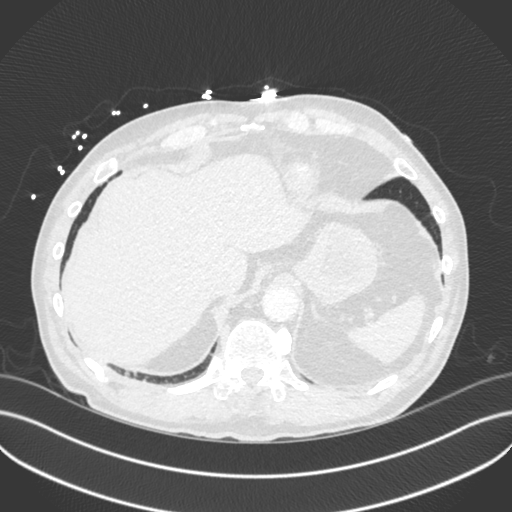
[im 136/487  soft-tissue]
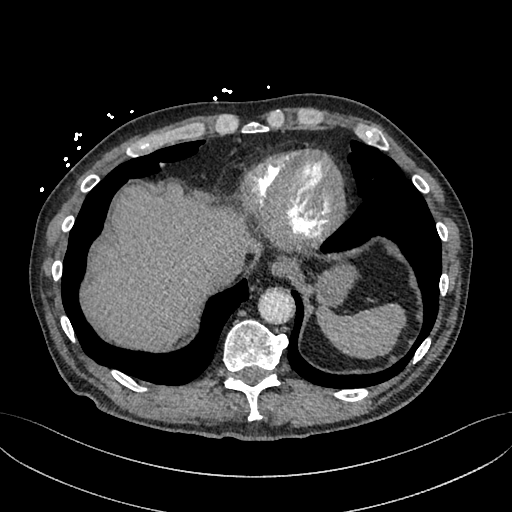
[im 163/487  lung]
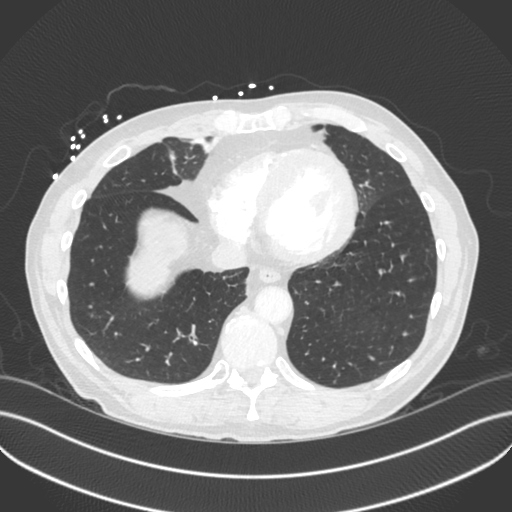
[im 190/487  soft-tissue]
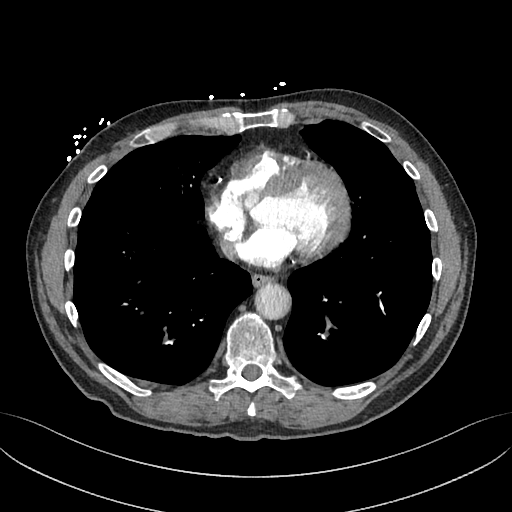
[im 217/487  lung]
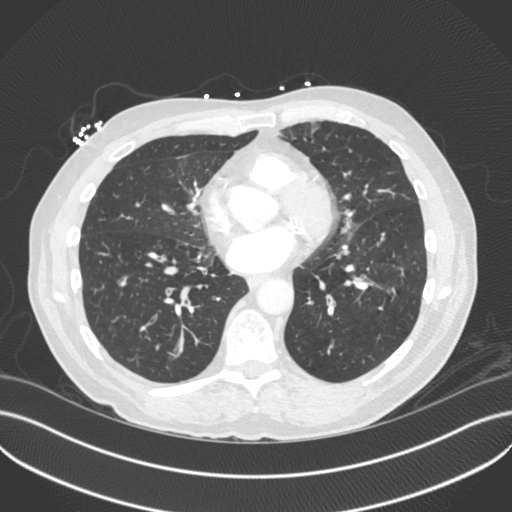
[im 271/487  soft-tissue]
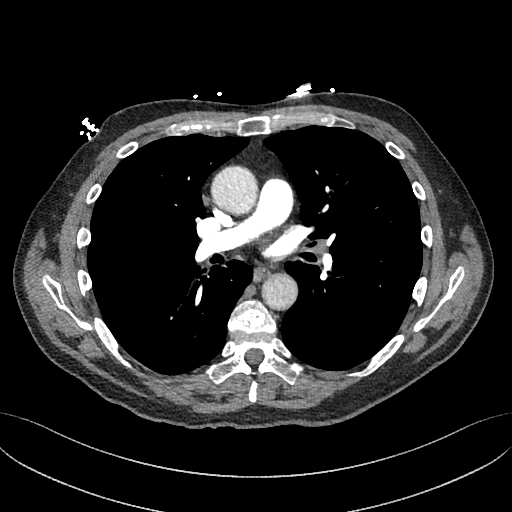
[im 298/487  lung]
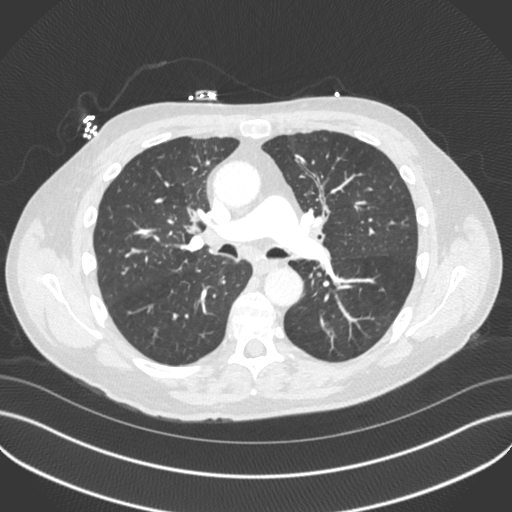
[im 325/487  soft-tissue]
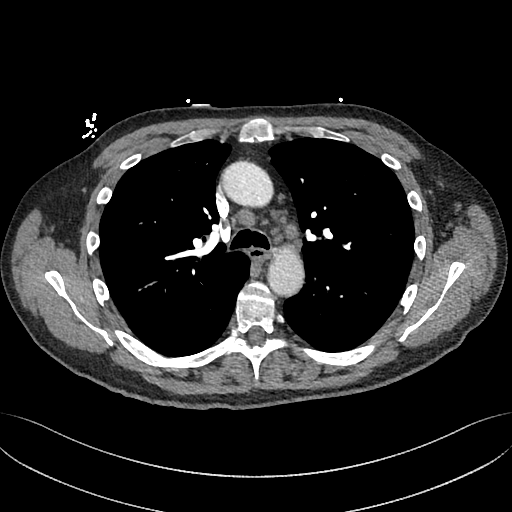
[im 352/487  lung]
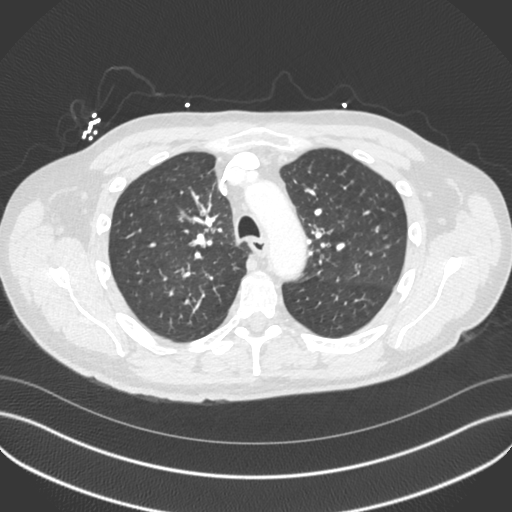
[im 379/487  soft-tissue]
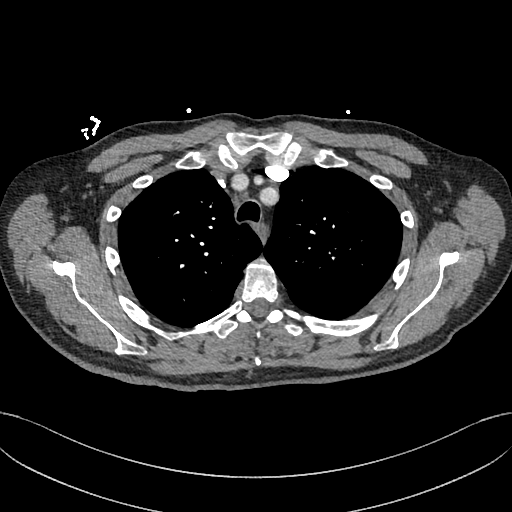
[im 433/487  lung]
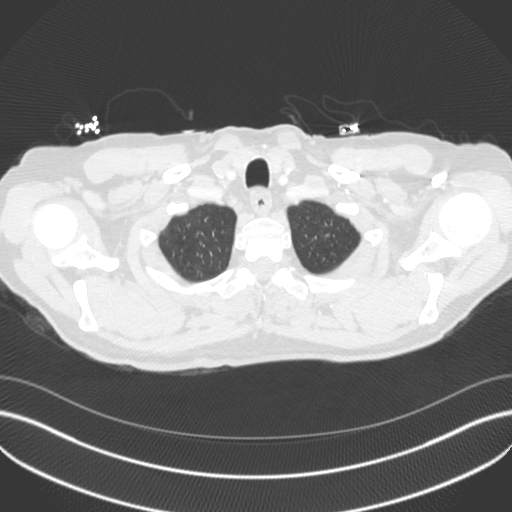
[im 460/487  soft-tissue]
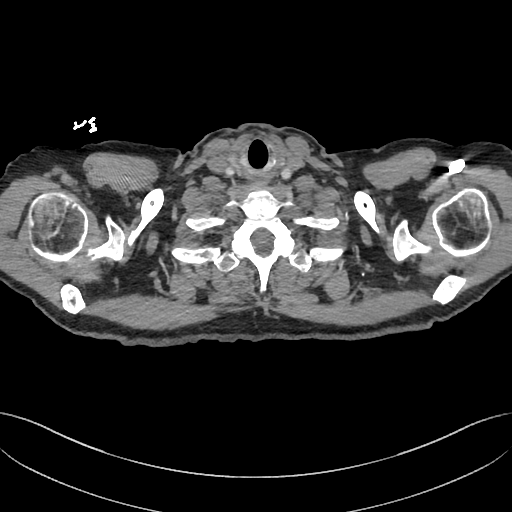

[Series 8: cor · coronal · 0.70mm/px · 3 of 142 slices shown]
[im 36/142  soft-tissue]
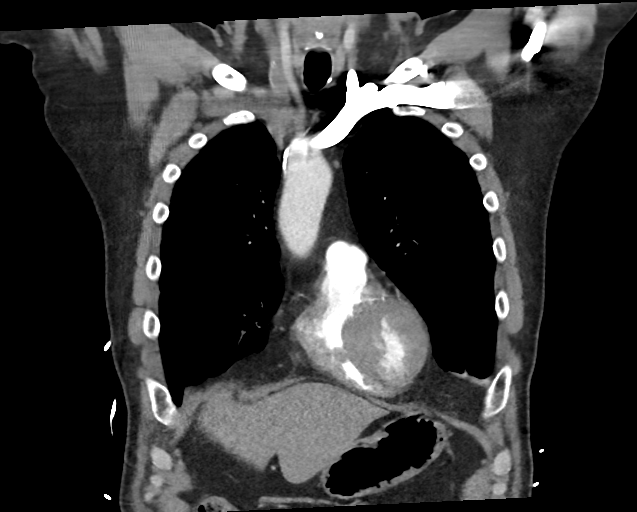
[im 71/142  soft-tissue]
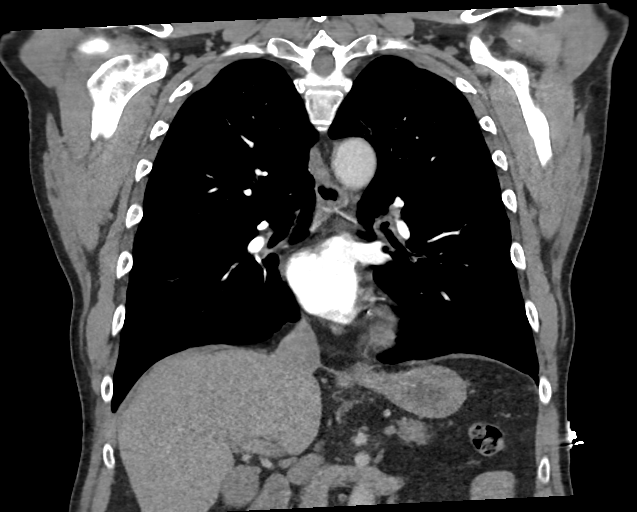
[im 106/142  soft-tissue]
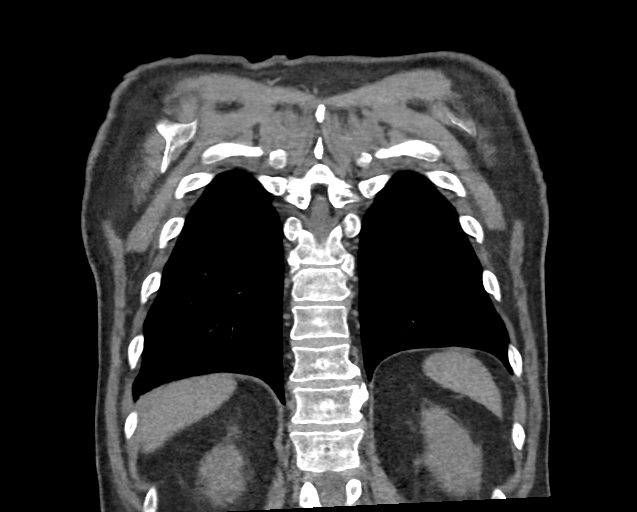

[17 of 46 positions shown; findings below may reference images not displayed]

FINDINGS: Cardiovascular: The pulmonary arteries are normal in caliber without
evidence of thromboemboli. The cardiac size is normal. There is
patchy, moderately heavy three-vessel calcific CAD, mild aortic
atherosclerosis with ectatic ascending aorta measuring 3.8 cm,
aortic root 3.6 cm, mild descending segment tortuosity and normal
great vessel branching opacification with trace calcification in the
left subclavian artery. There is no aortic aneurysm or dissection.
There is no pericardial effusion and no venous dilatation.

Mediastinum/Nodes: There are mildly prominent right paratracheal
nodes up to 1 cm in short axis, additional mildly prominent
precarinal nodes up to 1.3 cm in short axis with AP window and
subcarinal nodes up to 1.1 cm in short axis and hilar lymph nodes
bilaterally up to 1.2 cm in short axis. There is no bulky or
encasing adenopathy.

No thyroid or axillary mass is seen. The esophagus and tracheal air
caliber unremarkable.

Lungs/Pleura: The lungs hyperinflated with early centrilobular
emphysematous changes in lung apices. There are thickened bronchi in
the upper and lower lobes. Bronchiolar impaction is seen in some of
the thickened lower lobe small airways. There are ground-glass
opacities in the medial right middle lobe, in the infrahilar and
medial areas of lingula and could represent pneumonitis or micro
atelectasis. There is a 1.5 cm opacity in the lingular base which
is probably focal atelectasis but does not show air bronchograms the
fourth nodule is not excluded. Small pneumonia could also be
present.

The remaining lungs and central airways are clear. There is no
pleural effusion or pneumothorax.

Upper Abdomen: No acute abnormality.

Musculoskeletal: There is osteopenia, mild chronic anterior wedging
of the T7, T8, T9, T10, T11, T12 and L1 vertebral bodies,
spondylosis, degenerative disc disease and endplate irregularities.

Review of the MIP images confirms the above findings.
IMPRESSION: 1. No appreciable arterial embolus or dilatation. No secondary signs
of right heart strain.
2. Aortic and coronary artery atherosclerosis with ectasia of the
aortic root and ascending segment measuring 3.6 cm and 3.8 cm
respectively.
3. Mildly prominent mediastinal and hilar lymph nodes. No bulky or
encasing adenopathy.
4. Hyperinflated with early centrilobular emphysema in the apices,
thickened bronchi centrally consistent with bronchitis or reactive
airways disease, and bronchiolar impaction in some of the thickened
lower lobe small airways as well as ground-glass opacities in the
right middle lobe and lingula which could be microatelectasis or
pneumonitis. Three-month follow-up study recommended.
5. 1.5 cm lingular base opacity which could be due to atelectasis, a
small pneumonia, or a nodule. PET-CT or three-month follow-up CT
recommended.
